# Patient Record
Sex: Male | Born: 1950 | Race: White | Hispanic: No | Marital: Married | Smoking: Former smoker
Health system: Southern US, Community
[De-identification: ages and names within clinical notes are randomized; demographics above are authoritative.]

## PROBLEM LIST (undated history)

## (undated) DIAGNOSIS — I77810 Thoracic aortic ectasia: Secondary | ICD-10-CM

## (undated) DIAGNOSIS — I2510 Atherosclerotic heart disease of native coronary artery without angina pectoris: Secondary | ICD-10-CM

## (undated) DIAGNOSIS — I700 Atherosclerosis of aorta: Secondary | ICD-10-CM

## (undated) DIAGNOSIS — I10 Essential (primary) hypertension: Secondary | ICD-10-CM

## (undated) DIAGNOSIS — E785 Hyperlipidemia, unspecified: Secondary | ICD-10-CM

## (undated) HISTORY — DX: Essential (primary) hypertension: I10

## (undated) HISTORY — DX: Thoracic aortic ectasia: I77.810

## (undated) HISTORY — DX: Hyperlipidemia, unspecified: E78.5

## (undated) HISTORY — DX: Atherosclerosis of aorta: I70.0

## (undated) HISTORY — PX: COLONOSCOPY: SHX174

## (undated) HISTORY — DX: Atherosclerotic heart disease of native coronary artery without angina pectoris: I25.10

---

## 1995-07-23 DIAGNOSIS — E785 Hyperlipidemia, unspecified: Secondary | ICD-10-CM

## 1995-07-23 HISTORY — DX: Hyperlipidemia, unspecified: E78.5

## 2003-11-22 ENCOUNTER — Encounter: Payer: Self-pay | Admitting: Family Medicine

## 2004-11-04 ENCOUNTER — Ambulatory Visit: Payer: Self-pay | Admitting: Family Medicine

## 2004-11-21 ENCOUNTER — Encounter: Payer: Self-pay | Admitting: Family Medicine

## 2004-12-12 ENCOUNTER — Ambulatory Visit: Payer: Self-pay | Admitting: Family Medicine

## 2005-01-28 ENCOUNTER — Ambulatory Visit: Payer: Self-pay | Admitting: Family Medicine

## 2005-04-09 ENCOUNTER — Ambulatory Visit: Payer: Self-pay | Admitting: Family Medicine

## 2005-05-12 ENCOUNTER — Ambulatory Visit: Payer: Self-pay | Admitting: Family Medicine

## 2005-05-22 ENCOUNTER — Ambulatory Visit: Payer: Self-pay | Admitting: Family Medicine

## 2005-05-26 ENCOUNTER — Ambulatory Visit: Payer: Self-pay | Admitting: Family Medicine

## 2005-06-04 ENCOUNTER — Ambulatory Visit: Payer: Self-pay | Admitting: Family Medicine

## 2005-06-04 DIAGNOSIS — E291 Testicular hypofunction: Secondary | ICD-10-CM

## 2005-06-23 ENCOUNTER — Ambulatory Visit: Payer: Self-pay | Admitting: Family Medicine

## 2005-07-25 ENCOUNTER — Ambulatory Visit: Payer: Self-pay | Admitting: Family Medicine

## 2005-08-19 ENCOUNTER — Ambulatory Visit: Payer: Self-pay | Admitting: Family Medicine

## 2005-08-27 ENCOUNTER — Ambulatory Visit: Payer: Self-pay | Admitting: Family Medicine

## 2005-09-19 ENCOUNTER — Ambulatory Visit: Payer: Self-pay | Admitting: Family Medicine

## 2005-10-03 ENCOUNTER — Ambulatory Visit: Payer: Self-pay | Admitting: Family Medicine

## 2005-10-08 ENCOUNTER — Ambulatory Visit: Payer: Self-pay | Admitting: Family Medicine

## 2005-10-30 ENCOUNTER — Ambulatory Visit: Payer: Self-pay | Admitting: Family Medicine

## 2005-11-12 ENCOUNTER — Ambulatory Visit: Payer: Self-pay | Admitting: Family Medicine

## 2005-11-25 ENCOUNTER — Ambulatory Visit: Payer: Self-pay | Admitting: Family Medicine

## 2005-11-28 ENCOUNTER — Ambulatory Visit: Payer: Self-pay | Admitting: Family Medicine

## 2005-12-24 ENCOUNTER — Ambulatory Visit: Payer: Self-pay | Admitting: Family Medicine

## 2006-01-09 ENCOUNTER — Ambulatory Visit: Payer: Self-pay | Admitting: Family Medicine

## 2006-01-22 ENCOUNTER — Encounter: Payer: Self-pay | Admitting: Family Medicine

## 2006-01-22 LAB — CONVERTED CEMR LAB: PSA: 1.03 ng/mL

## 2006-01-27 ENCOUNTER — Ambulatory Visit: Payer: Self-pay | Admitting: Family Medicine

## 2006-02-11 ENCOUNTER — Ambulatory Visit: Payer: Self-pay | Admitting: Family Medicine

## 2006-02-11 LAB — CONVERTED CEMR LAB: PSA: 1.03 ng/mL

## 2006-02-13 ENCOUNTER — Ambulatory Visit: Payer: Self-pay | Admitting: Family Medicine

## 2006-03-03 ENCOUNTER — Ambulatory Visit: Payer: Self-pay | Admitting: Family Medicine

## 2006-03-04 ENCOUNTER — Ambulatory Visit: Payer: Self-pay | Admitting: Family Medicine

## 2006-04-03 ENCOUNTER — Ambulatory Visit: Payer: Self-pay | Admitting: Family Medicine

## 2006-05-11 ENCOUNTER — Ambulatory Visit: Payer: Self-pay | Admitting: Family Medicine

## 2006-05-25 ENCOUNTER — Ambulatory Visit: Payer: Self-pay | Admitting: Family Medicine

## 2006-06-12 ENCOUNTER — Ambulatory Visit: Payer: Self-pay | Admitting: Family Medicine

## 2006-06-26 ENCOUNTER — Ambulatory Visit: Payer: Self-pay | Admitting: Family Medicine

## 2006-07-14 ENCOUNTER — Ambulatory Visit: Payer: Self-pay | Admitting: Family Medicine

## 2006-07-27 ENCOUNTER — Ambulatory Visit: Payer: Self-pay | Admitting: Family Medicine

## 2006-08-12 ENCOUNTER — Ambulatory Visit: Payer: Self-pay | Admitting: Family Medicine

## 2006-09-18 ENCOUNTER — Ambulatory Visit: Payer: Self-pay | Admitting: Family Medicine

## 2006-10-20 ENCOUNTER — Ambulatory Visit: Payer: Self-pay | Admitting: Family Medicine

## 2006-10-23 ENCOUNTER — Ambulatory Visit: Payer: Self-pay | Admitting: Family Medicine

## 2006-11-25 ENCOUNTER — Ambulatory Visit: Payer: Self-pay | Admitting: Family Medicine

## 2007-03-11 ENCOUNTER — Encounter: Payer: Self-pay | Admitting: Family Medicine

## 2007-03-11 DIAGNOSIS — E785 Hyperlipidemia, unspecified: Secondary | ICD-10-CM

## 2007-03-18 ENCOUNTER — Ambulatory Visit: Payer: Self-pay | Admitting: Family Medicine

## 2007-03-18 LAB — CONVERTED CEMR LAB
Albumin: 4 g/dL (ref 3.5–5.2)
Alkaline Phosphatase: 49 units/L (ref 39–117)
BUN: 12 mg/dL (ref 6–23)
Chloride: 107 meq/L (ref 96–112)
Creatinine, Ser: 1 mg/dL (ref 0.4–1.5)
Direct LDL: 163.2 mg/dL
HDL: 40.6 mg/dL (ref >39.0)
Potassium: 4.1 meq/L (ref 3.5–5.1)
Total Bilirubin: 0.9 mg/dL (ref 0.3–1.2)
VLDL: 31 mg/dL (ref 0–40)

## 2007-03-23 ENCOUNTER — Ambulatory Visit: Payer: Self-pay | Admitting: Family Medicine

## 2007-04-16 ENCOUNTER — Ambulatory Visit: Payer: Self-pay | Admitting: Family Medicine

## 2007-04-29 ENCOUNTER — Telehealth: Payer: Self-pay | Admitting: Family Medicine

## 2007-05-05 ENCOUNTER — Ambulatory Visit: Payer: Self-pay | Admitting: Family Medicine

## 2007-05-05 LAB — CONVERTED CEMR LAB: AST: 19 units/L (ref 0–37)

## 2007-06-17 ENCOUNTER — Encounter (INDEPENDENT_AMBULATORY_CARE_PROVIDER_SITE_OTHER): Payer: Self-pay | Admitting: *Deleted

## 2007-06-21 ENCOUNTER — Ambulatory Visit: Payer: Self-pay | Admitting: Family Medicine

## 2007-06-21 LAB — CONVERTED CEMR LAB
AST: 18 units/L (ref 0–37)
Cholesterol: 182 mg/dL (ref 0–200)
Testosterone: 313.85 ng/dL — ABNORMAL LOW (ref 350.00–890)
Total CHOL/HDL Ratio: 4.4

## 2007-06-23 ENCOUNTER — Ambulatory Visit: Payer: Self-pay | Admitting: Family Medicine

## 2007-12-01 ENCOUNTER — Telehealth: Payer: Self-pay | Admitting: Family Medicine

## 2008-01-14 ENCOUNTER — Encounter: Payer: Self-pay | Admitting: Family Medicine

## 2008-03-29 ENCOUNTER — Ambulatory Visit: Payer: Self-pay | Admitting: Family Medicine

## 2008-03-29 LAB — CONVERTED CEMR LAB
ALT: 22 units/L (ref 0–53)
BUN: 12 mg/dL (ref 6–23)
CO2: 30 meq/L (ref 19–32)
Calcium: 9.1 mg/dL (ref 8.4–10.5)
Cholesterol: 143 mg/dL (ref 0–200)
Creatinine, Ser: 1 mg/dL (ref 0.4–1.5)
Glucose, Bld: 99 mg/dL (ref 70–99)
HDL: 41.1 mg/dL (ref >39.0)
PSA: 1.06 ng/mL (ref 0.10–4.00)
TSH: 1.42 microintl units/mL (ref 0.35–5.50)
Total CHOL/HDL Ratio: 3.5
Total Protein: 7 g/dL (ref 6.0–8.3)
Triglycerides: 87 mg/dL (ref 0–149)

## 2008-04-04 ENCOUNTER — Ambulatory Visit: Payer: Self-pay | Admitting: Family Medicine

## 2008-05-12 ENCOUNTER — Ambulatory Visit: Payer: Self-pay | Admitting: Family Medicine

## 2008-05-17 ENCOUNTER — Encounter (INDEPENDENT_AMBULATORY_CARE_PROVIDER_SITE_OTHER): Payer: Self-pay | Admitting: *Deleted

## 2008-05-17 ENCOUNTER — Ambulatory Visit: Payer: Self-pay | Admitting: Family Medicine

## 2008-05-30 ENCOUNTER — Telehealth: Payer: Self-pay | Admitting: Family Medicine

## 2008-05-31 ENCOUNTER — Ambulatory Visit: Payer: Self-pay | Admitting: Family Medicine

## 2008-06-01 LAB — CONVERTED CEMR LAB: Testosterone: 1146.1 ng/dL — ABNORMAL HIGH (ref 350.00–890)

## 2009-04-23 ENCOUNTER — Telehealth: Payer: Self-pay | Admitting: Family Medicine

## 2009-06-06 ENCOUNTER — Ambulatory Visit: Payer: Self-pay | Admitting: Family Medicine

## 2009-06-07 LAB — CONVERTED CEMR LAB
ALT: 18 U/L (ref 0–53)
AST: 19 U/L (ref 0–37)
Albumin: 4.1 g/dL (ref 3.5–5.2)
Alkaline Phosphatase: 41 U/L (ref 39–117)
BUN: 15 mg/dL (ref 6–23)
Bilirubin, Direct: 0.1 mg/dL (ref 0.0–0.3)
CO2: 32 meq/L (ref 19–32)
Calcium: 8.8 mg/dL (ref 8.4–10.5)
Chloride: 101 meq/L (ref 96–112)
Cholesterol: 139 mg/dL (ref 0–200)
Creatinine, Ser: 1 mg/dL (ref 0.4–1.5)
GFR calc non Af Amer: 82.1 mL/min (ref >60)
Glucose, Bld: 98 mg/dL (ref 70–99)
HDL: 43.6 mg/dL (ref >39.00)
LDL Cholesterol: 75 mg/dL (ref 0–99)
PSA: 1.37 ng/mL (ref 0.10–4.00)
Potassium: 4.1 meq/L (ref 3.5–5.1)
Sodium: 143 meq/L (ref 135–145)
TSH: 0.92 u[IU]/mL (ref 0.35–5.50)
Total Bilirubin: 1 mg/dL (ref 0.3–1.2)
Total CHOL/HDL Ratio: 3
Total Protein: 7.1 g/dL (ref 6.0–8.3)
Triglycerides: 101 mg/dL (ref 0.0–149.0)
VLDL: 20.2 mg/dL (ref 0.0–40.0)

## 2009-06-13 ENCOUNTER — Ambulatory Visit: Payer: Self-pay | Admitting: Family Medicine

## 2009-06-13 LAB — CONVERTED CEMR LAB: Testosterone: 160.21 ng/dL — ABNORMAL LOW (ref 350.00–890.00)

## 2009-06-20 ENCOUNTER — Telehealth: Payer: Self-pay | Admitting: Family Medicine

## 2009-07-03 ENCOUNTER — Ambulatory Visit: Payer: Self-pay | Admitting: Gastroenterology

## 2009-07-17 ENCOUNTER — Ambulatory Visit: Payer: Self-pay | Admitting: Gastroenterology

## 2010-03-27 ENCOUNTER — Telehealth: Payer: Self-pay | Admitting: Family Medicine

## 2010-05-29 ENCOUNTER — Encounter: Payer: Self-pay | Admitting: Family Medicine

## 2010-05-31 ENCOUNTER — Ambulatory Visit: Payer: Self-pay | Admitting: Family Medicine

## 2010-06-02 LAB — CONVERTED CEMR LAB
ALT: 23 units/L (ref 0–53)
AST: 19 units/L (ref 0–37)
Albumin: 4.3 g/dL (ref 3.5–5.2)
Alkaline Phosphatase: 45 units/L (ref 39–117)
BUN: 18 mg/dL (ref 6–23)
CO2: 30 meq/L (ref 19–32)
Chloride: 106 meq/L (ref 96–112)
Cholesterol: 176 mg/dL (ref 0–200)
GFR calc non Af Amer: 71.78 mL/min (ref >60)
Glucose, Bld: 94 mg/dL (ref 70–99)
Potassium: 4.2 meq/L (ref 3.5–5.1)
Sodium: 142 meq/L (ref 135–145)
TSH: 0.99 microintl units/mL (ref 0.35–5.50)
Total Protein: 6.7 g/dL (ref 6.0–8.3)
VLDL: 18.2 mg/dL (ref 0.0–40.0)

## 2010-06-17 ENCOUNTER — Ambulatory Visit: Payer: Self-pay | Admitting: Family Medicine

## 2010-07-15 ENCOUNTER — Encounter: Payer: Self-pay | Admitting: Family Medicine

## 2010-07-24 ENCOUNTER — Encounter (INDEPENDENT_AMBULATORY_CARE_PROVIDER_SITE_OTHER): Payer: Self-pay | Admitting: *Deleted

## 2010-10-14 ENCOUNTER — Telehealth: Payer: Self-pay | Admitting: Family Medicine

## 2010-10-22 ENCOUNTER — Encounter: Payer: Self-pay | Admitting: Family Medicine

## 2010-11-20 ENCOUNTER — Emergency Department (HOSPITAL_COMMUNITY)
Admission: EM | Admit: 2010-11-20 | Discharge: 2010-11-20 | Payer: Self-pay | Source: Home / Self Care | Admitting: Family Medicine

## 2010-11-29 ENCOUNTER — Emergency Department (HOSPITAL_COMMUNITY)
Admission: EM | Admit: 2010-11-29 | Discharge: 2010-11-29 | Payer: Self-pay | Source: Home / Self Care | Admitting: Family Medicine

## 2010-12-04 ENCOUNTER — Encounter: Payer: Self-pay | Admitting: Family Medicine

## 2010-12-12 ENCOUNTER — Telehealth: Payer: Self-pay | Admitting: Family Medicine

## 2011-01-21 NOTE — Consult Note (Signed)
Summary: Dr.Marc Nesi,[facility],Note  Dr.Marc Nesi,[facility],Note   Imported By: Beau Fanny 08/02/2010 08:47:44  _____________________________________________________________________  External Attachment:    Type:   Image     Comment:   External Document  Appended Document: Dr.Marc Nesi,[facility],Note    Clinical Lists Changes  Observations: Added new observation of PAST MED HX: Hyperlipidemia 8/96 low T per Dr. Brunilda Payor with uro (08/03/2010 19:29) Added new observation of DM PROGRESS: N/A (08/03/2010 19:29) Added new observation of DM FSREVIEW: N/A (08/03/2010 19:29) Added new observation of HTN PROGRESS: N/A (08/03/2010 19:29) Added new observation of HTN FSREVIEW: N/A (08/03/2010 19:29)         Past History:  Past Medical History: Hyperlipidemia 8/96 low T per Dr. Brunilda Payor with uro

## 2011-01-21 NOTE — Consult Note (Signed)
Summary: [facility]  [facility]   Imported By: Lanelle Bal 07/22/2010 10:19:30  _____________________________________________________________________  External Attachment:    Type:   Image     Comment:   External Document  Appended Document: [facility] Lyla Son has asked that this report be re-faxed.

## 2011-01-21 NOTE — Medication Information (Signed)
Summary: [facility] Prior Review/Certification Change  [facility] [employer] [facility] Prior Review/Certification Change   Imported By: Beau Fanny 06/18/2010 15:33:57  _____________________________________________________________________  External Attachment:    Type:   Image     Comment:   External Document

## 2011-01-21 NOTE — Progress Notes (Signed)
Summary: refill request for androgel  Phone Note Refill Request Call back at Home Phone [phone] Message from:  Patient  Refills Requested: Medication #1:  ANDROGEL PUMP 1 %  GEL Apply 5 gm once daily with directions and precautions Please call to [facility] [address].  [phone]  Initial call taken by: Lowella Petties CMA,  March 27, 2010 3:55 PM  Follow-up for Phone Call        Pls insure he keeps his appts the end of June. Follow-up by: Shaune Leeks MD,  March 27, 2010 5:05 PM  Additional Follow-up for Phone Call Additional follow up Details #1::        Called to [facility] [address]. Additional Follow-up by: Lowella Petties CMA,  March 27, 2010 5:09 PM    Prescriptions: ANDROGEL PUMP 1 %  GEL (TESTOSTERONE) Apply 5 gm once daily with directions and precautions  #150 Gram x 0   Entered and Authorized by:   Shaune Leeks MD   Signed by:   Shaune Leeks MD on 03/27/2010   Method used:   Print then Give to Patient   RxID:   [id number]

## 2011-01-21 NOTE — Consult Note (Signed)
Summary: Dr.Michael Daniel,[facility],Note  Dr.Michael Daniel,[facility],Note   Imported By: Beau Fanny 10/24/2010 09:21:30  _____________________________________________________________________  External Attachment:    Type:   Image     Comment:   External Document

## 2011-01-21 NOTE — Letter (Signed)
Summary: Nadara Eaton letter  [place] [facility]  [address] [place], [place] [address]   Phone: [phone]  Fax: [phone]       07/24/2010 MRN: [id number]  Domanic Geidel [address] [place], [place]  [address]  Dear Mr. W[facility] [place], and [facility] announce the retirement of Arta Silence, M.D., from full-time practice at the [facility] office effective June 20, 2010 and his plans of returning part-time.  It is important to Dr. Hetty Ely and to our practice that you understand that [facility] has seven physicians in our office for your health care needs.  We will continue to offer the same exceptional care that you have today.    Dr. Hetty Ely has spoken to many of you about his plans for retirement and returning part-time in the fall.   We will continue to work with you through the transition to schedule appointments for you in the office and meet the high standards that [place] is committed to.   Again, it is with great pleasure that we share the news that Dr. Hetty Ely will return to [facility] in October of 2011 with a reduced schedule.    If you have any questions, or would like to request an appointment with one of our physicians, please call us at [phone] and press the option for Scheduling an appointment.  We take pleasure in providing you with excellent patient care and look forward to seeing you at your next office visit.  Our [facility] Physicians are:  Tillman Abide, M.D. Laurita Quint, M.D. Roxy Manns, M.D. Kerby Nora, M.D. Hannah Beat, M.D. Ruthe Mannan, M.D. We proudly welcomed Raechel Ache, M.D. and Eustaquio Boyden, M.D. to the practice in July/August 2011.  Sincerely,  [place] [facility]

## 2011-01-21 NOTE — Progress Notes (Signed)
Summary: wants to see a different urologist  Phone Note Call from Patient Call back at Home Phone [phone] Call back at [phone]   Caller: Patient Call For: Randy Leeks MD Summary of Call: Pt is not happy with urologist and wants to be referred to someone in [place] or [place].  He is dissatified with the wait time at Dr. Madilyn Hook office.   Initial call taken by: Lowella Petties CMA, AAMA,  October 14, 2010 10:11 AM  Follow-up for Phone Call        Please refer to Dr Reuel Boom in [place]. Follow-up by: Randy Leeks MD,  October 15, 2010 12:38 PM

## 2011-01-21 NOTE — Assessment & Plan Note (Signed)
Summary: CPX/DLO   Vital Signs:  Patient profile:   60 year old male Height:      73 inches Weight:      234.50 pounds BMI:     31.05 Temp:     98.7 degrees F oral Pulse rate:   84 / minute Pulse rhythm:   regular BP sitting:   130 / 88  (left arm) Cuff size:   large  Vitals Entered By: Sydell Axon LPN (June 17, 2010 1:56 PM) CC: 30 Minute checkup, had a colonoscopy 07/10 by Dr. Arlyce Dice   History of Present Illness: Pt here for Comp Exam. He has somre left knee disconfort he attributes this to strain and it goes away in 2-3 days. He is on his feet for prolonged time working 12 hrs at a time. He tries to do 10000steps daily at work.  He needs alternative testosterone as his insurance has decided not to cover his medication.  Preventive Screening-Counseling & Management  Alcohol-Tobacco     Alcohol drinks/day:  5-6 times a week     Alcohol type: wine     Smoking Status: quit     Year Quit: 79     Pack years: 10     Passive Smoke Exposure: no  Caffeine-Diet-Exercise     Caffeine use/day: 2     Does Patient Exercise: yes     Type of exercise: walking 20-30mins     Times/week: 4  Problems Prior to Update: 1)  Special Screening Malig Neoplasms Other Sites  (ICD-V76.49) 2)  Special Screening Malignant Neoplasm of Prostate  (ICD-V76.44) 3)  Health Maintenance Exam  (ICD-V70.0) 4)  Testosterone Deficiency  (ICD-257.2) 5)  Hyperlipidemia  (ICD-272.4)  Medications Prior to Update: 1)  Fish Oil 1000 Mg  Caps (Omega-3 Fatty Acids) .... Take One By Mouth Two Times A Day 2)  Androgel Pump 1 %  Gel (Testosterone) .... Apply 5 Gm Once Daily With Directions and Precautions 3)  Vitamin C .... Take One By Mouth Daily 4)  Vitamin D .... Take One By Mouth Daily 5)  Simvastatin 40 Mg  Tabs (Simvastatin) .Marland Kitchen.. 1 Q Hs  Allergies: No Known Drug Allergies  Past History:  Past Medical History: Last updated: 03/11/2007 Hyperlipidemia 8/96  Family History: Last updated:  06/17/2010 Father A 82 Going Blind  Catarracts  Mother A 7^  Htn DM Kidney removal Twin Brother A 57  CABGx3  (Smoker longtime, now quit) Sister A 66  Social History: Last updated: 04/04/2008 Occupation: [employer] tech [employer] Married lives w/wife daughter 39yoa  Risk Factors: Alcohol Use:  5-6 times a week (06/17/2010) Caffeine Use: 2 (06/17/2010) Exercise: yes (06/17/2010)  Risk Factors: Smoking Status: quit (06/17/2010) Passive Smoke Exposure: no (06/17/2010)  Family History: Father A 82 Going Blind  Education officer, community  Mother A 7^  Htn DM Kidney removal Twin Brother A 57  CABGx3  (Smoker longtime, now quit) Sister A 46  Review of Systems General:  Denies chills, fatigue, fever, sweats, weakness, and weight loss. Eyes:  Denies blurring, discharge, eye irritation, and eye pain. ENT:  Denies decreased hearing, ear discharge, and earache. CV:  Denies chest pain or discomfort, fainting, fatigue, palpitations, shortness of breath with exertion, swelling of feet, and swelling of hands. Resp:  Denies cough, shortness of breath, and wheezing. GI:  Denies abdominal pain, bloody stools, change in bowel habits, constipation, dark tarry stools, diarrhea, indigestion, loss of appetite, nausea, vomiting, vomiting blood, and yellowish skin color. GU:  Denies discharge, dysuria, incontinence, nocturia,  and urinary frequency. MS:  Denies joint pain, muscle aches, cramps, muscle weakness, and stiffness; occas knee discomfort, achillees irritation. Derm:  Denies dryness, itching, and rash. Neuro:  Denies numbness, poor balance, tingling, and tremors.  Physical Exam  General:  Well-developed,well-nourished,in no acute distress; alert,appropriate and cooperative throughout examination Head:  Normocephalic and atraumatic without obvious abnormalities. No apparent alopecia or balding. Sinuses NT. Eyes:  Conjunctiva clear bilaterally.  Ears:  External ear exam shows no significant lesions or deformities.   Otoscopic examination reveals clear canals, tympanic membranes are intact bilaterally without bulging, retraction, inflammation or discharge. Hearing is grossly normal bilaterally. Nose:  External nasal examination shows no deformity or inflammation. Nasal mucosa are pink and moist without lesions or exudates. Mouth:  Oral mucosa and oropharynx without lesions or exudates.  Teeth in good repair. Neck:  No deformities, masses, or tenderness noted. Chest Wall:  No deformities, masses, tenderness or gynecomastia noted. Breasts:  No masses or gynecomastia noted Lungs:  Normal respiratory effort, chest expands symmetrically. Lungs are clear to auscultation, no crackles or wheezes. Heart:  Normal rate and regular rhythm. S1 and S2 normal without gallop, murmur, click, rub or other extra sounds. Abdomen:  Bowel sounds positive,abdomen soft and non-tender without masses, organomegaly or hernias noted. Rectal:  No external abnormalities noted. Normal sphincter tone. No rectal masses or tenderness. G neg. Genitalia:  Testes bilaterally descended without nodularity, tenderness or masses. No scrotal masses or lesions. No penis lesions or urethral discharge. Prostate:  Prostate gland firm and smooth, no enlargement, nodularity, tenderness, mass, asymmetry or induration. 20-30gms. Msk:  No deformity or scoliosis noted of thoracic or lumbar spine.   Pulses:  R and L carotid,radial,femoral,dorsalis pedis and posterior tibial pulses are full and equal bilaterally Extremities:  No clubbing, cyanosis, edema, or deformity noted with normal full range of motion of all joints.   Neurologic:  No cranial nerve deficits noted. Station and gait are normal.  Sensory, motor and coordinative functions appear intact. Skin:  Intact without suspicious lesions or rashes Cervical Nodes:  No lymphadenopathy noted Inguinal Nodes:  No significant adenopathy Psych:  Cognition and judgment appear intact. Alert and cooperative with  normal attention span and concentration. No apparent delusions, illusions, hallucinations   Impression & Recommendations:  Problem # 1:  HEALTH MAINTENANCE EXAM (ICD-V70.0)  Reviewed preventive care protocols, scheduled due services, and updated immunizations.  Problem # 2:  SPECIAL SCREENING MALIGNANT NEOPLASM OF PROSTATE (ICD-V76.44) Assessment: Unchanged Stable exam and PSA.  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 Will be given higher dose. Labs o/w nml. Orders: Urology Referral (Urology)  Problem # 4:  HYPERLIPIDEMIA (ICD-272.4) Assessment: Unchanged Stable and acceptable levels. Cont Simva. His updated medication list for this problem includes:    Simvastatin 40 Mg Tabs (Simvastatin) .Marland Kitchen... Take one by mouth at bedtime  Labs Reviewed: SGOT: 19 (05/31/2010)   SGPT: 23 (05/31/2010)   HDL:45.60 (05/31/2010), 43.60 (06/06/2009)  LDL:112 (05/31/2010), 75 (16/09/9603)  Chol:176 (05/31/2010), 139 (06/06/2009)  Trig:91.0 (05/31/2010), 101.0 (06/06/2009)  Complete Medication List: 1)  Fish Oil 1000 Mg Caps (Omega-3 fatty acids) .... Take one by mouth two times a day 2)  Androgel Pump 1 % Gel (Testosterone) .... Apply 5 gm once daily with directions and precautions 3)  Simvastatin 40 Mg Tabs (Simvastatin) .... Take one by mouth at bedtime 4)  Multivitamins Tabs (Multiple vitamin) .... Take one by mouth daily 5)  Aspirin 81 Mg Tabs (Aspirin) .... Take one by  mouth daily  Patient Instructions: 1)  Refer to Urology. Prescriptions: ANDROGEL PUMP 1 %  GEL (TESTOSTERONE) Apply 5 gm once daily with directions and precautions  #150 Gram x 0   Entered and Authorized by:   Shaune Leeks MD   Signed by:   Shaune Leeks MD on 06/17/2010   Method used:   Print then Give to Patient   RxID:   [id number]   Current Allergies (reviewed today): No known allergies

## 2011-01-23 NOTE — Letter (Signed)
Summary: [facility]  [facility]   Imported By: Maryln Gottron 12/25/2010 09:40:23  _____________________________________________________________________  External Attachment:    Type:   Image     Comment:   External Document

## 2011-01-23 NOTE — Progress Notes (Signed)
Summary: Rx Androgel  Phone Note Refill Request Call back at [phone] Message from:  [facility] on December 12, 2010 10:28 AM  Refills Requested: Medication #1:  ANDROGEL PUMP 1 %  GEL Apply 5 gm once daily with directions and precautions   Last Refilled: 06/17/2010  Method Requested: Electronic Initial call taken by: Sydell Axon LPN,  December 12, 2010 10:28 AM  Follow-up for Phone Call        Prefer the pt get his Androgel script from Dr Reuel Boom who he is now seeing for Testosterone replacement. Follow-up by: Shaune Leeks MD,  December 12, 2010 10:42 AM  Additional Follow-up for Phone Call Additional follow up Details #1::        Pharmacy notified as instructed by telephone. Additional Follow-up by: Sydell Axon LPN,  December 12, 2010 11:02 AM

## 2011-05-24 ENCOUNTER — Encounter: Payer: Self-pay | Admitting: Family Medicine

## 2011-05-29 ENCOUNTER — Other Ambulatory Visit: Payer: Self-pay | Admitting: Family Medicine

## 2011-05-29 DIAGNOSIS — E785 Hyperlipidemia, unspecified: Secondary | ICD-10-CM

## 2011-05-29 DIAGNOSIS — Z0000 Encounter for general adult medical examination without abnormal findings: Secondary | ICD-10-CM

## 2011-06-20 ENCOUNTER — Other Ambulatory Visit (INDEPENDENT_AMBULATORY_CARE_PROVIDER_SITE_OTHER): Payer: BC Managed Care – PPO

## 2011-06-20 DIAGNOSIS — E785 Hyperlipidemia, unspecified: Secondary | ICD-10-CM

## 2011-06-20 DIAGNOSIS — Z0000 Encounter for general adult medical examination without abnormal findings: Secondary | ICD-10-CM

## 2011-06-20 LAB — HEPATIC FUNCTION PANEL
ALT: 20 U/L (ref 0–53)
Bilirubin, Direct: 0.1 mg/dL (ref 0.0–0.3)
Total Bilirubin: 0.7 mg/dL (ref 0.3–1.2)

## 2011-06-20 LAB — BASIC METABOLIC PANEL
CO2: 29 mEq/L (ref 19–32)
Calcium: 9.2 mg/dL (ref 8.4–10.5)
Chloride: 106 mEq/L (ref 96–112)
Glucose, Bld: 94 mg/dL (ref 70–99)
Potassium: 4 mEq/L (ref 3.5–5.1)
Sodium: 142 mEq/L (ref 135–145)

## 2011-06-20 LAB — LIPID PANEL
Total CHOL/HDL Ratio: 3
VLDL: 12 mg/dL (ref 0.0–40.0)

## 2011-06-26 ENCOUNTER — Ambulatory Visit (INDEPENDENT_AMBULATORY_CARE_PROVIDER_SITE_OTHER): Payer: BC Managed Care – PPO | Admitting: Family Medicine

## 2011-06-26 ENCOUNTER — Encounter: Payer: Self-pay | Admitting: Family Medicine

## 2011-06-26 DIAGNOSIS — E291 Testicular hypofunction: Secondary | ICD-10-CM

## 2011-06-26 DIAGNOSIS — E785 Hyperlipidemia, unspecified: Secondary | ICD-10-CM

## 2011-06-26 MED ORDER — SIMVASTATIN 40 MG PO TABS: 40.0000 mg | ORAL_TABLET | Freq: Every day | ORAL | Status: DC

## 2011-06-26 NOTE — Assessment & Plan Note (Signed)
Cont trmt via Dr Reuel Boom.

## 2011-06-26 NOTE — Assessment & Plan Note (Signed)
Good nos. Cont cuyrr medication. Lab Results  Component Value Date   CHOL 157 06/20/2011   CHOL 176 05/31/2010   CHOL 139 06/06/2009   Lab Results  Component Value Date   HDL 48.90 06/20/2011   HDL 81.19 05/31/2010   HDL 14.78 06/06/2009   Lab Results  Component Value Date   LDLCALC 96 06/20/2011   LDLCALC 112* 05/31/2010   LDLCALC 75 06/06/2009   Lab Results  Component Value Date   TRIG 60.0 06/20/2011   TRIG 91.0 05/31/2010   TRIG 101.0 06/06/2009   Lab Results  Component Value Date   CHOLHDL 3 06/20/2011   CHOLHDL 4 05/31/2010   CHOLHDL 3 06/06/2009   Lab Results  Component Value Date   LDLDIRECT 163.2 03/18/2007

## 2011-06-26 NOTE — Progress Notes (Signed)
  Subjective:    Patient ID: Randy Pittman, male    DOB: [birth year], 60 y.o.   MRN: [id number]  HPI Pty here fro Comp Exam. He feels well and has no complaints.     Review of Systems  Constitutional: Negative for fever, chills, diaphoresis, appetite change, fatigue and unexpected weight change.  HENT: Negative for hearing loss, ear pain, tinnitus and ear discharge.   Eyes: Negative for pain, discharge and visual disturbance.  Respiratory: Negative for cough, shortness of breath and wheezing.   Cardiovascular: Negative for chest pain and palpitations.       No SOB w/ exertion  Gastrointestinal: Negative for nausea, vomiting, abdominal pain, diarrhea, constipation and blood in stool.       No heartburn or swallowing problems.  Genitourinary: Negative for dysuria, frequency and difficulty urinating.       No nocturia  Musculoskeletal: Negative for myalgias and back pain. Arthralgias: mild, chronically   Skin: Negative for rash.       No itching or dryness.  Neurological: Negative for tremors and numbness.       No tingling or balance problems.  Hematological: Negative for adenopathy. Does not bruise/bleed easily.  Psychiatric/Behavioral: Negative for dysphoric mood and agitation.       Objective:   Physical Exam  Constitutional: He is oriented to person, place, and time. He appears well-developed and well-nourished. No distress.  HENT:  Head: Normocephalic and atraumatic.  Right Ear: External ear normal.  Left Ear: External ear normal.  Nose: Nose normal.  Mouth/Throat: Oropharynx is clear and moist.  Eyes: Conjunctivae and EOM are normal. Pupils are equal, round, and reactive to light. Right eye exhibits no discharge. Left eye exhibits no discharge. No scleral icterus.  Neck: Normal range of motion. Neck supple. No thyromegaly present.  Cardiovascular: Normal rate, regular rhythm, normal heart sounds and intact distal pulses.   No murmur heard. Pulmonary/Chest: Effort normal  and breath sounds normal. No respiratory distress. He has no wheezes.  Abdominal: Soft. Bowel sounds are normal. He exhibits no distension and no mass. There is no tenderness. There is no rebound and no guarding.  Genitourinary: Rectum normal, prostate normal and penis normal. Guaiac negative stool.       Prostate 30gms smooth and symmetric.  Musculoskeletal: Normal range of motion. He exhibits no edema.  Lymphadenopathy:    He has no cervical adenopathy.  Neurological: He is alert and oriented to person, place, and time. Coordination normal.  Skin: Skin is warm and dry. No rash noted. He is not diaphoretic.  Psychiatric: He has a normal mood and affect. His behavior is normal. Judgment and thought content normal.          Assessment & Plan:  HMPE

## 2012-04-06 ENCOUNTER — Encounter: Payer: Self-pay | Admitting: Family Medicine

## 2012-04-06 ENCOUNTER — Ambulatory Visit (INDEPENDENT_AMBULATORY_CARE_PROVIDER_SITE_OTHER): Payer: BC Managed Care – PPO | Admitting: Family Medicine

## 2012-04-06 VITALS — BP 122/90 | HR 92 | Temp 98.3°F | Wt 239.0 lb

## 2012-04-06 DIAGNOSIS — R413 Other amnesia: Secondary | ICD-10-CM

## 2012-04-06 NOTE — Patient Instructions (Signed)
Stop the simvastatin for now. Call back with an update in about 2 weeks, sooner if needed.  Ask for Lugene or Para March.  Take care.  Glad to see you.

## 2012-04-06 NOTE — Progress Notes (Signed)
Memory trouble.  Worse over last few weeks per patient.  Wife had noted it over last few months.  He had trouble working the phone recently.  This was atypical.  He didn't know if it was related to statin.  He stopped it a few days ago.  Wife thinks he's some better in the meantime and slightly less confused but not back to baseline.  He's not having trouble at work but he has a routine at work.    Off testosterone for >1 year, prev rx'd per Uro.  No other new meds, changes.  He does work nights, has been doing that for 10+ years.  He sleeps well usually during the day.  No FCNAVD, no rash.  He feels well.   No focal neuro changes.  No h/o stroke.    PMH and SH reviewed  ROS: See HPI, otherwise noncontributory.  Meds, vitals, and allergies reviewed.   GEN: nad, alert and oriented HEENT: mucous membranes moist NECK: supple w/o LA CV: rrr.  no murmur PULM: ctab, no inc wob ABD: soft, +bs EXT: no edema SKIN: no acute rash CN 2-12 wnl B, S/S/DTR wnl x4 29/30 on MMSE

## 2012-04-08 DIAGNOSIS — R413 Other amnesia: Secondary | ICD-10-CM | POA: Insufficient documentation

## 2012-04-08 NOTE — Assessment & Plan Note (Addendum)
No overt sx today, wife thinks he is improving.  29/30 on MMSE.  This could be related to statin.  We talked about options.  Will hold off on w/u at this point with normal neuro exam today.  See if continues to improve off statin.  Call back with update.  Okay for outpatient f/u.  >25 min spent with face to face with patient, >50% counseling and/or coordinating care

## 2012-06-10 ENCOUNTER — Encounter: Payer: Self-pay | Admitting: Family Medicine

## 2012-06-10 ENCOUNTER — Ambulatory Visit (INDEPENDENT_AMBULATORY_CARE_PROVIDER_SITE_OTHER): Payer: BC Managed Care – PPO | Admitting: Family Medicine

## 2012-06-10 VITALS — BP 132/90 | HR 80 | Temp 98.4°F | Wt 233.0 lb

## 2012-06-10 DIAGNOSIS — R413 Other amnesia: Secondary | ICD-10-CM

## 2012-06-10 LAB — COMPREHENSIVE METABOLIC PANEL
ALT: 21 U/L (ref 0–53)
AST: 20 U/L (ref 0–37)
BUN: 20 mg/dL (ref 6–23)
Creatinine, Ser: 1.1 mg/dL (ref 0.4–1.5)
GFR: 75.95 mL/min (ref >60.00)
Total Bilirubin: 0.6 mg/dL (ref 0.3–1.2)

## 2012-06-10 LAB — CBC WITH DIFFERENTIAL/PLATELET
Basophils Absolute: 0.1 10*3/uL (ref 0.0–0.1)
Basophils Relative: 0.9 % (ref 0.0–3.0)
Eosinophils Absolute: 0.2 10*3/uL (ref 0.0–0.7)
HCT: 46 % (ref 39.0–52.0)
Hemoglobin: 15.1 g/dL (ref 13.0–17.0)
Lymphs Abs: 1.3 10*3/uL (ref 0.7–4.0)
MCHC: 32.9 g/dL (ref 30.0–36.0)
MCV: 89.6 fl (ref 78.0–100.0)
Monocytes Absolute: 0.6 10*3/uL (ref 0.1–1.0)
Neutro Abs: 3.6 10*3/uL (ref 1.4–7.7)
RDW: 14.2 % (ref 11.5–14.6)

## 2012-06-10 NOTE — Progress Notes (Signed)
Prev seen for possible memory changes. In interval, he stayed of simvastatin. Unclear if this had a beneficial effect.  Since last OV- he had one episode driving, almost missed at stop sign (he didn't miss it).  He was talking and it was unclear if this was due to attention or otherwise.  He had the prev episode noted when he couldn't work the phone.  Others have noted that he is less precise, careful.  He isn't sure that he has any changes going on.    Off testosterone for >1 year, prev rx'd per Uro. No other new meds, changes. He does work nights, has been doing that for 10+ years.   Last OV note reviewed.   Meds, vitals, and allergies reviewed.   ROS: See HPI.  Otherwise, noncontributory.  GEN: nad, alert and oriented HEENT: mucous membranes moist NECK: supple w/o LA CV: rrr PULM: ctab, no inc wob ABD: soft, +bs EXT: no edema SKIN: no acute rash CN 2-12 wnl B, S/S/DTR wnl x4 Prev 29/30 on MMSE.  Again 29/30 today.

## 2012-06-10 NOTE — Patient Instructions (Addendum)
Go to the lab on the way out.  We'll contact you with your lab report. If you notice other change in the meantime, notify me.

## 2012-06-11 NOTE — Assessment & Plan Note (Signed)
Unclear if this is really present.  If present, the source isn't clear. Would check baseline labs today. If wnl, would check CT head.  If still negative w/u at that point, would follow and consider neuro referral.  >25 min spent with face to face with patient, >50% counseling and/or coordinating care.  See notes on labs.

## 2012-06-18 ENCOUNTER — Ambulatory Visit (INDEPENDENT_AMBULATORY_CARE_PROVIDER_SITE_OTHER)
Admission: RE | Admit: 2012-06-18 | Discharge: 2012-06-18 | Disposition: A | Discharge status: Home / Self Care | Payer: BC Managed Care – PPO | Source: Ambulatory Visit | Attending: Family Medicine | Admitting: Family Medicine

## 2012-06-18 DIAGNOSIS — R413 Other amnesia: Secondary | ICD-10-CM

## 2012-07-02 ENCOUNTER — Other Ambulatory Visit: Payer: Self-pay | Admitting: Family Medicine

## 2012-07-02 DIAGNOSIS — Z125 Encounter for screening for malignant neoplasm of prostate: Secondary | ICD-10-CM

## 2012-07-02 DIAGNOSIS — E7800 Pure hypercholesterolemia, unspecified: Secondary | ICD-10-CM

## 2012-07-09 ENCOUNTER — Other Ambulatory Visit (INDEPENDENT_AMBULATORY_CARE_PROVIDER_SITE_OTHER): Payer: BC Managed Care – PPO

## 2012-07-09 DIAGNOSIS — E7800 Pure hypercholesterolemia, unspecified: Secondary | ICD-10-CM

## 2012-07-09 DIAGNOSIS — Z125 Encounter for screening for malignant neoplasm of prostate: Secondary | ICD-10-CM

## 2012-07-09 LAB — LIPID PANEL
HDL: 50.2 mg/dL (ref >39.00)
Triglycerides: 112 mg/dL (ref 0.0–149.0)
VLDL: 22.4 mg/dL (ref 0.0–40.0)

## 2012-07-09 LAB — PSA: PSA: 1.35 ng/mL (ref 0.10–4.00)

## 2012-07-09 LAB — LDL CHOLESTEROL, DIRECT: Direct LDL: 169.4 mg/dL

## 2012-07-13 ENCOUNTER — Encounter: Payer: Self-pay | Admitting: Family Medicine

## 2012-07-13 ENCOUNTER — Ambulatory Visit (INDEPENDENT_AMBULATORY_CARE_PROVIDER_SITE_OTHER): Payer: BC Managed Care – PPO | Admitting: Family Medicine

## 2012-07-13 VITALS — BP 140/90 | HR 84 | Temp 98.8°F | Ht 72.75 in | Wt 227.2 lb

## 2012-07-13 DIAGNOSIS — Z0000 Encounter for general adult medical examination without abnormal findings: Secondary | ICD-10-CM | POA: Insufficient documentation

## 2012-07-13 DIAGNOSIS — R413 Other amnesia: Secondary | ICD-10-CM

## 2012-07-13 NOTE — Patient Instructions (Addendum)
See Shirlee Limerick about your referral before you leave today. You can start back on the simvastatin if okay with neurology.  Take care.

## 2012-07-13 NOTE — Assessment & Plan Note (Signed)
30/30 on MMSE today.  I don't know if his history is significant.  We agreed with neuro referral.  If that is unremarkable, I would restart statin and f/u prn.  He agrees.

## 2012-07-13 NOTE — Progress Notes (Signed)
CPE- See plan.  Routine anticipatory guidance given to patient.  See health maintenance. Tetanus 2011 Flu shot done at work Colonoscopy 2010 PSA wnl. Prostate cancer screening and PSA options (with potential risks and benefits of testing vs not testing) were discussed along with recent recs/guidelines.  Living will.  He'll check his papers for this.    Prev with question of short term memory changes.  He had one episode where he had trouble working a phone. See prev notes.  No recent obvious changes.  CT head was negative. Prev lab w/u was negative.  He's noted some mild short term changes, ie with name recall.  Father with dementia.  It was unclear if coming off the simvastatin was helpful in terms of symptoms.  He has not yet seen neuro. He agrees with referral.    PMH and SH reviewed  Meds, vitals, and allergies reviewed.   ROS: See HPI.  Otherwise negative.    GEN: nad, alert and oriented HEENT: mucous membranes moist NECK: supple w/o LA CV: rrr. PULM: ctab, no inc wob ABD: soft, +bs EXT: no edema SKIN: no acute rash DRE deferred. CN 2-12 wnl B, S/S/DTR wnl x4

## 2012-07-22 ENCOUNTER — Encounter: Payer: Self-pay | Admitting: Family Medicine

## 2012-08-18 ENCOUNTER — Other Ambulatory Visit: Payer: Self-pay

## 2012-08-18 MED ORDER — SIMVASTATIN 40 MG PO TABS: 40.0000 mg | ORAL_TABLET | Freq: Every day | ORAL | Status: DC

## 2012-08-18 NOTE — Telephone Encounter (Signed)
Sent!

## 2012-08-18 NOTE — Telephone Encounter (Signed)
[facility] faxed refill request Simvastatin. pts wife said Dr Daphane Shepherd neurologist said OK to restart Simvastatin.See 07/13/12 office note.Please advise.

## 2012-08-19 ENCOUNTER — Other Ambulatory Visit: Payer: Self-pay | Admitting: Family Medicine

## 2012-08-19 MED ORDER — SIMVASTATIN 40 MG PO TABS: 40.0000 mg | ORAL_TABLET | Freq: Every day | ORAL | Status: DC

## 2013-05-19 ENCOUNTER — Telehealth: Payer: Self-pay | Admitting: Family Medicine

## 2013-05-19 NOTE — Telephone Encounter (Signed)
Will see PCP next week.

## 2013-05-19 NOTE — Telephone Encounter (Signed)
Noted  

## 2013-05-19 NOTE — Telephone Encounter (Signed)
Patient Information:  Caller Name: Lupita Leash  Phone: [phone]  Patient: Randy Pittman, Randy Pittman  Gender: Male  DOB: 06-17-1951  Age: 62 Years  PCP: Crawford Givens Clelia Croft) [facility])  Office Follow Up:  Does the office need to follow up with this patient?: No  Instructions For The Office: N/A   Symptoms  Reason For Call & Symptoms: Pt's wife/Donna states pt having espisodes of confusion, not sleeping at night, falling asleep more than normal in the day. Pt is losing weight.  Reviewed Health History In EMR: Yes  Reviewed Medications In EMR: Yes  Reviewed Allergies In EMR: Yes  Reviewed Surgeries / Procedures: Yes  Date of Onset of Symptoms: 04/21/2013  Guideline(s) Used:  Confusion - Delirium  Disposition Per Guideline:   See Today in Office  Reason For Disposition Reached:   Transient confusion (i.e., completely resolved)  Advice Given:  N/A  Patient Refused Recommendation:  Patient Refused Appt, Patient Requests Appt At Later Date  Pt refused appt today, and Pt's wife scheduled appt for Monday 05/23/13 at 10:30 AM.

## 2013-05-23 ENCOUNTER — Ambulatory Visit (INDEPENDENT_AMBULATORY_CARE_PROVIDER_SITE_OTHER): Payer: BC Managed Care – PPO | Admitting: Family Medicine

## 2013-05-23 ENCOUNTER — Encounter: Payer: Self-pay | Admitting: Family Medicine

## 2013-05-23 VITALS — BP 134/100 | HR 96 | Temp 98.8°F | Wt 227.5 lb

## 2013-05-23 DIAGNOSIS — E291 Testicular hypofunction: Secondary | ICD-10-CM

## 2013-05-23 DIAGNOSIS — E7800 Pure hypercholesterolemia, unspecified: Secondary | ICD-10-CM

## 2013-05-23 DIAGNOSIS — R413 Other amnesia: Secondary | ICD-10-CM

## 2013-05-23 NOTE — Patient Instructions (Addendum)
Schedule a lab visit for early Friday AM.  We'll go from there.   Take care.

## 2013-05-23 NOTE — Assessment & Plan Note (Signed)
I am not sure that he has a true issue here.  He is still working and I doubt that he could do that with sig memory loss.  Sleep schedule could be playing a role here.  Would check basic labs (and his labs for his upcoming physical) and we'll go from there.  Reassuring exam.  He is alert and oriented with swift and accurate recall on testing today.

## 2013-05-23 NOTE — Progress Notes (Signed)
He was prev on androgel. He is off it now.    Wife was worried about confusion.  He had a trial off simvastatin w/o an improvement.  He doesn't give a history of profound memory loss.  Prev with normal CT and intact neuropsychological testing prev.  He doesn't have truly significant weight loss- prev values noted and discussed.   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  He is getting 7-8 hours of sleep each AM and early afternoon.  He takes an OTC sleep aid.  He doesn't have excessive snoring.  No sudden drowsy episodes. No known apneas, not waking gasping for air.    Meds, vitals, and allergies reviewed.   ROS: See HPI.  Otherwise, noncontributory.  GEN: nad, alert and oriented HEENT: mucous membranes moist NECK: supple w/o LA CV: rrr PULM: ctab, no inc wob ABD: soft, +bs EXT: no edema CN 2-12 wnl B, S/S/DTR wnl x4

## 2013-05-27 ENCOUNTER — Other Ambulatory Visit (INDEPENDENT_AMBULATORY_CARE_PROVIDER_SITE_OTHER): Payer: BC Managed Care – PPO

## 2013-05-27 DIAGNOSIS — E7800 Pure hypercholesterolemia, unspecified: Secondary | ICD-10-CM

## 2013-05-27 DIAGNOSIS — E291 Testicular hypofunction: Secondary | ICD-10-CM

## 2013-05-27 DIAGNOSIS — R413 Other amnesia: Secondary | ICD-10-CM

## 2013-05-27 LAB — COMPREHENSIVE METABOLIC PANEL
AST: 21 U/L (ref 0–37)
Alkaline Phosphatase: 41 U/L (ref 39–117)
BUN: 18 mg/dL (ref 6–23)
Creatinine, Ser: 1 mg/dL (ref 0.4–1.5)
Glucose, Bld: 95 mg/dL (ref 70–99)
Total Bilirubin: 0.7 mg/dL (ref 0.3–1.2)

## 2013-05-27 LAB — TSH: TSH: 1.44 u[IU]/mL (ref 0.35–5.50)

## 2013-05-27 LAB — LIPID PANEL
Cholesterol: 156 mg/dL (ref 0–200)
Total CHOL/HDL Ratio: 3
Triglycerides: 82 mg/dL (ref 0.0–149.0)

## 2013-05-27 LAB — TESTOSTERONE: Testosterone: 240.37 ng/dL — ABNORMAL LOW (ref 350.00–890.00)

## 2013-05-27 LAB — LUTEINIZING HORMONE: LH: 2.32 m[IU]/mL (ref 1.50–9.30)

## 2013-06-02 ENCOUNTER — Other Ambulatory Visit: Payer: Self-pay | Admitting: Family Medicine

## 2013-06-02 DIAGNOSIS — E349 Endocrine disorder, unspecified: Secondary | ICD-10-CM

## 2013-06-27 DIAGNOSIS — N529 Male erectile dysfunction, unspecified: Secondary | ICD-10-CM | POA: Insufficient documentation

## 2013-06-27 DIAGNOSIS — E291 Testicular hypofunction: Secondary | ICD-10-CM | POA: Insufficient documentation

## 2013-06-27 DIAGNOSIS — N400 Enlarged prostate without lower urinary tract symptoms: Secondary | ICD-10-CM | POA: Insufficient documentation

## 2013-06-27 DIAGNOSIS — R6882 Decreased libido: Secondary | ICD-10-CM | POA: Insufficient documentation

## 2013-07-08 ENCOUNTER — Other Ambulatory Visit: Payer: BC Managed Care – PPO

## 2013-07-15 ENCOUNTER — Encounter: Payer: Self-pay | Admitting: Family Medicine

## 2013-07-15 ENCOUNTER — Ambulatory Visit (INDEPENDENT_AMBULATORY_CARE_PROVIDER_SITE_OTHER): Payer: BC Managed Care – PPO | Admitting: Family Medicine

## 2013-07-15 VITALS — BP 140/88 | HR 88 | Temp 98.2°F | Ht 75.0 in | Wt 228.5 lb

## 2013-07-15 DIAGNOSIS — Z0000 Encounter for general adult medical examination without abnormal findings: Secondary | ICD-10-CM

## 2013-07-15 DIAGNOSIS — R413 Other amnesia: Secondary | ICD-10-CM

## 2013-07-15 DIAGNOSIS — E785 Hyperlipidemia, unspecified: Secondary | ICD-10-CM

## 2013-07-15 MED ORDER — SIMVASTATIN 40 MG PO TABS: 40.0000 mg | ORAL_TABLET | Freq: Every day | ORAL | Status: DC

## 2013-07-15 NOTE — Patient Instructions (Addendum)
Check with your insurance to see if they will cover the shingles shot. I would get a flu shot each fall.   Take care.  Glad to see you.  

## 2013-07-15 NOTE — Progress Notes (Signed)
CPE- See plan.  Routine anticipatory guidance given to patient.  See health maintenance. Tetanus 2011 Flu shot prev done Check with your insurance to see if they will cover the shingles shot. Colonoscopy 2010 Prostate cancer screening and PSA options (with potential risks and benefits of testing vs not testing) were discussed along with recent recs/guidelines.  He declined testing PSA at this point. Living will d/w pt.  He has one.  Wife is designated if incapacitated.  Diet and exercise d/w pt.  "real good" on both.    Memory changes. Likely w/o sig troubles with memory, s/p testing.  He isn't waking gasping for air.  He wakes fairly refreshed given his work schedule.  He occ snores.  He saw uro about the T level prev.  He declined treatment with T per uro.    Elevated Cholesterol: Using medications without problems:yes Muscle aches: no Diet compliance:yes Exercise:yes  PMH and SH reviewed  Meds, vitals, and allergies reviewed.   ROS: See HPI.  Otherwise negative.    GEN: nad, alert and oriented HEENT: mucous membranes moist NECK: supple w/o LA CV: rrr. PULM: ctab, no inc wob ABD: soft, +bs EXT: no edema SKIN: no acute rash

## 2013-07-17 NOTE — Assessment & Plan Note (Signed)
Likely w/o sig troubles with memory, s/p testing.  Unlikely to have OSA.  He isn't waking gasping for air.  He wakes fairly refreshed given his work schedule.  He occ snores.  He saw uro about the T level prev.  He declined treatment with T per uro.

## 2013-07-17 NOTE — Assessment & Plan Note (Signed)
Routine anticipatory guidance given to patient.  See health maintenance. Tetanus 2011 Flu shot prev done Check with your insurance to see if they will cover the shingles shot. Colonoscopy 2010 Prostate cancer screening and PSA options (with potential risks and benefits of testing vs not testing) were discussed along with recent recs/guidelines.  He declined testing PSA at this point. Living will d/w pt.  He has one.  Wife is designated if incapacitated.  Diet and exercise d/w pt.  "real good" on both.

## 2013-07-17 NOTE — Assessment & Plan Note (Signed)
Controlled, continue as is.  Labs d/w pt.  

## 2013-10-07 ENCOUNTER — Ambulatory Visit (INDEPENDENT_AMBULATORY_CARE_PROVIDER_SITE_OTHER): Payer: BC Managed Care – PPO | Admitting: *Deleted

## 2013-10-07 DIAGNOSIS — Z2911 Encounter for prophylactic immunotherapy for respiratory syncytial virus (RSV): Secondary | ICD-10-CM

## 2013-10-07 DIAGNOSIS — Z23 Encounter for immunization: Secondary | ICD-10-CM

## 2013-10-27 ENCOUNTER — Other Ambulatory Visit: Payer: Self-pay

## 2014-06-12 ENCOUNTER — Other Ambulatory Visit: Payer: BC Managed Care – PPO

## 2014-06-16 ENCOUNTER — Encounter: Payer: BC Managed Care – PPO | Admitting: Family Medicine

## 2014-07-23 ENCOUNTER — Other Ambulatory Visit: Payer: Self-pay | Admitting: Family Medicine

## 2014-07-23 DIAGNOSIS — E785 Hyperlipidemia, unspecified: Secondary | ICD-10-CM

## 2014-07-24 ENCOUNTER — Other Ambulatory Visit (INDEPENDENT_AMBULATORY_CARE_PROVIDER_SITE_OTHER): Payer: BC Managed Care – PPO

## 2014-07-24 DIAGNOSIS — E785 Hyperlipidemia, unspecified: Secondary | ICD-10-CM

## 2014-07-24 LAB — COMPREHENSIVE METABOLIC PANEL
ALBUMIN: 4.2 g/dL (ref 3.5–5.2)
ALT: 22 U/L (ref 0–53)
AST: 21 U/L (ref 0–37)
Alkaline Phosphatase: 40 U/L (ref 39–117)
BILIRUBIN TOTAL: 0.6 mg/dL (ref 0.2–1.2)
BUN: 16 mg/dL (ref 6–23)
CO2: 29 meq/L (ref 19–32)
Calcium: 8.9 mg/dL (ref 8.4–10.5)
Chloride: 103 mEq/L (ref 96–112)
Creatinine, Ser: 1.1 mg/dL (ref 0.4–1.5)
GFR: 74.6 mL/min (ref >60.00)
GLUCOSE: 97 mg/dL (ref 70–99)
Potassium: 4.1 mEq/L (ref 3.5–5.1)
SODIUM: 137 meq/L (ref 135–145)
TOTAL PROTEIN: 6.8 g/dL (ref 6.0–8.3)

## 2014-07-24 LAB — LIPID PANEL
CHOLESTEROL: 142 mg/dL (ref 0–200)
HDL: 48.1 mg/dL (ref >39.00)
LDL Cholesterol: 80 mg/dL (ref 0–99)
NonHDL: 93.9
Total CHOL/HDL Ratio: 3
Triglycerides: 68 mg/dL (ref 0.0–149.0)
VLDL: 13.6 mg/dL (ref 0.0–40.0)

## 2014-07-28 ENCOUNTER — Encounter: Payer: Self-pay | Admitting: Family Medicine

## 2014-07-28 ENCOUNTER — Ambulatory Visit (INDEPENDENT_AMBULATORY_CARE_PROVIDER_SITE_OTHER): Payer: BC Managed Care – PPO | Admitting: Family Medicine

## 2014-07-28 VITALS — BP 140/88 | HR 81 | Temp 97.9°F | Ht 74.0 in | Wt 230.2 lb

## 2014-07-28 DIAGNOSIS — Z7189 Other specified counseling: Secondary | ICD-10-CM | POA: Insufficient documentation

## 2014-07-28 DIAGNOSIS — Z0000 Encounter for general adult medical examination without abnormal findings: Secondary | ICD-10-CM

## 2014-07-28 DIAGNOSIS — E785 Hyperlipidemia, unspecified: Secondary | ICD-10-CM

## 2014-07-28 MED ORDER — SIMVASTATIN 40 MG PO TABS: 40.0000 mg | ORAL_TABLET | Freq: Every day | ORAL | Status: DC

## 2014-07-28 NOTE — Patient Instructions (Signed)
Take care.  Keep exercising and working on your diet.  I would get a flu shot each fall.   Glad to see you.  Recheck next year.

## 2014-07-28 NOTE — Progress Notes (Signed)
Pre visit review using our clinic review tool, if applicable. No additional management support is needed unless otherwise documented below in the visit note.  CPE- See plan.  Routine anticipatory guidance given to patient.  See health maintenance. Tetanus 2011 Flu done at work PNA at 41 Shingles 2014 AAA screening not due.  D/w pt.   Colonoscopy 2010 Prostate cancer screening and PSA options (with potential risks and benefits of testing vs not testing) were discussed along with recent recs/guidelines.  He declined testing PSA at this point. Living will d/w pt.  Would have his wife designated if patient were incapacitated.   Diet and exercise d/w pt.  Doing well with both.  Yard work and walking.  He's considering joining the gym.  Diet is good.   Working nights, doing well.  No sig memory changes.    Elevated Cholesterol: Using medications without problems:yes Muscle aches: no Diet compliance:yes Exercise:yes  PMH and SH reviewed  Meds, vitals, and allergies reviewed.   ROS: See HPI.  Otherwise negative.    GEN: nad, alert and oriented HEENT: mucous membranes moist NECK: supple w/o LA CV: rrr. PULM: ctab, no inc wob ABD: soft, +bs EXT: no edema SKIN: no acute rash

## 2014-07-28 NOTE — Assessment & Plan Note (Signed)
Routine anticipatory guidance given to patient.  See health maintenance. Tetanus 2011 Flu done at work PNA at 64 Shingles 2014 AAA screening not due.  D/w pt.   Colonoscopy 2010 Prostate cancer screening and PSA options (with potential risks and benefits of testing vs not testing) were discussed along with recent recs/guidelines.  He declined testing PSA at this point. Living will d/w pt.  Would have his wife designated if he were incapacitated.   Diet and exercise d/w pt.  Doing well with both.  Yard work and walking.  He's considering joining the gym.  Diet is good.   Working nights, doing well.  No sig memory changes.

## 2014-07-28 NOTE — Assessment & Plan Note (Signed)
Controlled, continue diet, exercise, statin. Doing well.

## 2015-07-27 ENCOUNTER — Other Ambulatory Visit: Payer: Self-pay | Admitting: Family Medicine

## 2015-07-27 DIAGNOSIS — E785 Hyperlipidemia, unspecified: Secondary | ICD-10-CM

## 2015-07-29 ENCOUNTER — Other Ambulatory Visit: Payer: Self-pay | Admitting: Family Medicine

## 2015-07-30 ENCOUNTER — Other Ambulatory Visit (INDEPENDENT_AMBULATORY_CARE_PROVIDER_SITE_OTHER): Payer: 59

## 2015-07-30 DIAGNOSIS — E785 Hyperlipidemia, unspecified: Secondary | ICD-10-CM | POA: Diagnosis not present

## 2015-07-30 LAB — COMPREHENSIVE METABOLIC PANEL
ALBUMIN: 4.4 g/dL (ref 3.5–5.2)
ALT: 15 U/L (ref 0–53)
AST: 15 U/L (ref 0–37)
Alkaline Phosphatase: 41 U/L (ref 39–117)
BUN: 14 mg/dL (ref 6–23)
CO2: 29 mEq/L (ref 19–32)
CREATININE: 1.08 mg/dL (ref 0.40–1.50)
Calcium: 9.2 mg/dL (ref 8.4–10.5)
Chloride: 105 mEq/L (ref 96–112)
GFR: 73.56 mL/min (ref >60.00)
Glucose, Bld: 106 mg/dL — ABNORMAL HIGH (ref 70–99)
POTASSIUM: 4.1 meq/L (ref 3.5–5.1)
Sodium: 142 mEq/L (ref 135–145)
Total Bilirubin: 0.5 mg/dL (ref 0.2–1.2)
Total Protein: 6.9 g/dL (ref 6.0–8.3)

## 2015-07-30 LAB — LIPID PANEL
Cholesterol: 163 mg/dL (ref 0–200)
HDL: 50.5 mg/dL (ref >39.00)
LDL Cholesterol: 94 mg/dL (ref 0–99)
NonHDL: 112.13
Total CHOL/HDL Ratio: 3
Triglycerides: 92 mg/dL (ref 0.0–149.0)
VLDL: 18.4 mg/dL (ref 0.0–40.0)

## 2015-08-02 ENCOUNTER — Encounter: Payer: Self-pay | Admitting: Family Medicine

## 2015-08-02 ENCOUNTER — Ambulatory Visit (INDEPENDENT_AMBULATORY_CARE_PROVIDER_SITE_OTHER): Payer: 59 | Admitting: Family Medicine

## 2015-08-02 VITALS — BP 128/86 | HR 78 | Temp 98.3°F | Ht 74.0 in | Wt 224.0 lb

## 2015-08-02 DIAGNOSIS — Z0000 Encounter for general adult medical examination without abnormal findings: Secondary | ICD-10-CM

## 2015-08-02 DIAGNOSIS — E785 Hyperlipidemia, unspecified: Secondary | ICD-10-CM

## 2015-08-02 MED ORDER — SIMVASTATIN 40 MG PO TABS: ORAL_TABLET | ORAL | Status: DC

## 2015-08-02 NOTE — Assessment & Plan Note (Signed)
Controlled, continue as is.  Labs d/w pt.  Benefit likely >> risk for current med.  He agrees.  Continue exercise.  Mild inc in sugar, d/w pt about cutting out some sweets.

## 2015-08-02 NOTE — Patient Instructions (Signed)
Take care.  Glad to see you.  Keep exercising.  Recheck in about 1 year.  I would get a flu shot each fall.

## 2015-08-02 NOTE — Assessment & Plan Note (Signed)
Routine anticipatory guidance given to patient. See health maintenance.  Tetanus 2011  Flu done at work  PNA at 60  Shingles 2014  AAA screening not due. D/w pt.  Colonoscopy 2010  Prostate cancer screening and PSA options (with potential risks and benefits of testing vs not testing) were discussed along with recent recs/guidelines. He declined testing PSA at this point.  Living will d/w pt. Would have his wife designated if patient were incapacitated.  Diet and exercise d/w pt. Doing well with both. Yard work and walking. Diet is good.  Working nights, doing well. No sig memory changes.

## 2015-08-02 NOTE — Progress Notes (Signed)
Pre visit review using our clinic review tool, if applicable. No additional management support is needed unless otherwise documented below in the visit note.  CPE- See plan.  Routine anticipatory guidance given to patient.  See health maintenance. Tetanus 2011 Flu done at work PNA at 8 Shingles 2014 AAA screening not due. D/w pt.  Colonoscopy 2010 Prostate cancer screening and PSA options (with potential risks and benefits of testing vs not testing) were discussed along with recent recs/guidelines. He declined testing PSA at this point. Living will d/w pt. Would have his wife designated if patient were incapacitated.  Diet and exercise d/w pt. Doing well with both. Yard work and walking. Diet is good.  Working nights, doing well. No sig memory changes.   Elevated Cholesterol: Using medications without problems:yes Muscle aches: no Diet compliance:yes Exercise:yes  PMH and SH reviewed  Meds, vitals, and allergies reviewed.   ROS: See HPI.  Otherwise negative.    GEN: nad, alert and oriented HEENT: mucous membranes moist NECK: supple w/o LA CV: rrr. PULM: ctab, no inc wob ABD: soft, +bs EXT: no edema SKIN: no acute rash

## 2015-11-05 ENCOUNTER — Encounter: Payer: Self-pay | Admitting: Gastroenterology

## 2016-07-23 ENCOUNTER — Telehealth: Payer: Self-pay

## 2016-07-23 ENCOUNTER — Encounter: Payer: Self-pay | Admitting: Family Medicine

## 2016-07-23 DIAGNOSIS — Z8042 Family history of malignant neoplasm of prostate: Secondary | ICD-10-CM | POA: Insufficient documentation

## 2016-07-23 DIAGNOSIS — E785 Hyperlipidemia, unspecified: Secondary | ICD-10-CM

## 2016-07-23 DIAGNOSIS — Z125 Encounter for screening for malignant neoplasm of prostate: Secondary | ICD-10-CM

## 2016-07-23 NOTE — Telephone Encounter (Signed)
Thanks

## 2016-07-23 NOTE — Telephone Encounter (Signed)
1. I hate to hear about his family members being sick.   2. When we had discussed prev, my understanding was that he didn't have FH of prostate cancer.  I updated his chart in the meantime.  3. We would have talked about this at the OV anyway, ie I would have asked him about this.  4. I put in the routine orders, psa etc.  Ask him about HIV and HCV screening- this is routine now, to be done once in a lifetime if not already done.  Not mandatory, but if he wants done then I can order.  I would have d/w pt at OV.  This isn't mandatory.   I didn't order those yet.  Thanks.

## 2016-07-23 NOTE — Telephone Encounter (Signed)
Patient's wife Lupita Leash) notified as instructed by telephone and verbalized understanding. Lupita Leash stated patient is not at home now and she will have him talk with Dr. Para March about the additional lab work when he comes in for his appointment.

## 2016-07-23 NOTE — Telephone Encounter (Signed)
Left message on answering machine for patient to call back.  

## 2016-07-23 NOTE — Telephone Encounter (Signed)
Lupita Leash (DPR signed) left v/m requesting pt to have PSA when has labs already ordered on 08/01/16. Pt has family members that are dying from prostate CA. Pt last PSA per lab list 07/09/2012. Lupita Leash request cb.

## 2016-08-01 ENCOUNTER — Other Ambulatory Visit (INDEPENDENT_AMBULATORY_CARE_PROVIDER_SITE_OTHER): Payer: 59

## 2016-08-01 DIAGNOSIS — E785 Hyperlipidemia, unspecified: Secondary | ICD-10-CM

## 2016-08-01 DIAGNOSIS — Z125 Encounter for screening for malignant neoplasm of prostate: Secondary | ICD-10-CM

## 2016-08-01 LAB — LIPID PANEL
CHOL/HDL RATIO: 3
Cholesterol: 151 mg/dL (ref 0–200)
HDL: 51.3 mg/dL (ref >39.00)
LDL Cholesterol: 80 mg/dL (ref 0–99)
NonHDL: 99.69
TRIGLYCERIDES: 96 mg/dL (ref 0.0–149.0)
VLDL: 19.2 mg/dL (ref 0.0–40.0)

## 2016-08-01 LAB — COMPREHENSIVE METABOLIC PANEL
ALK PHOS: 42 U/L (ref 39–117)
ALT: 13 U/L (ref 0–53)
AST: 14 U/L (ref 0–37)
Albumin: 4.5 g/dL (ref 3.5–5.2)
BILIRUBIN TOTAL: 0.6 mg/dL (ref 0.2–1.2)
BUN: 25 mg/dL — ABNORMAL HIGH (ref 6–23)
CALCIUM: 9.2 mg/dL (ref 8.4–10.5)
CO2: 30 meq/L (ref 19–32)
Chloride: 105 mEq/L (ref 96–112)
Creatinine, Ser: 1.13 mg/dL (ref 0.40–1.50)
GFR: 69.59 mL/min (ref >60.00)
GLUCOSE: 87 mg/dL (ref 70–99)
POTASSIUM: 4.1 meq/L (ref 3.5–5.1)
Sodium: 142 mEq/L (ref 135–145)
Total Protein: 6.6 g/dL (ref 6.0–8.3)

## 2016-08-01 LAB — PSA: PSA: 1.42 ng/mL (ref 0.10–4.00)

## 2016-08-05 ENCOUNTER — Encounter: Payer: 59 | Admitting: Family Medicine

## 2016-08-05 ENCOUNTER — Other Ambulatory Visit: Payer: 59

## 2016-08-12 ENCOUNTER — Ambulatory Visit (INDEPENDENT_AMBULATORY_CARE_PROVIDER_SITE_OTHER): Payer: 59 | Admitting: Family Medicine

## 2016-08-12 ENCOUNTER — Other Ambulatory Visit: Payer: Self-pay | Admitting: Family Medicine

## 2016-08-12 ENCOUNTER — Encounter: Payer: Self-pay | Admitting: Family Medicine

## 2016-08-12 VITALS — BP 116/60 | HR 92 | Temp 98.6°F | Wt 221.2 lb

## 2016-08-12 DIAGNOSIS — E785 Hyperlipidemia, unspecified: Secondary | ICD-10-CM

## 2016-08-12 DIAGNOSIS — Z0000 Encounter for general adult medical examination without abnormal findings: Secondary | ICD-10-CM

## 2016-08-12 DIAGNOSIS — Z8042 Family history of malignant neoplasm of prostate: Secondary | ICD-10-CM

## 2016-08-12 DIAGNOSIS — Z7189 Other specified counseling: Secondary | ICD-10-CM

## 2016-08-12 MED ORDER — SIMVASTATIN 40 MG PO TABS: ORAL_TABLET | ORAL | 3 refills | Status: DC

## 2016-08-12 NOTE — Assessment & Plan Note (Signed)
Continue diet, exercise, statin, d/w pt.  He agrees.  Labs d/w pt.

## 2016-08-12 NOTE — Progress Notes (Signed)
Pre visit review using our clinic review tool, if applicable. No additional management support is needed unless otherwise documented below in the visit note. 

## 2016-08-12 NOTE — Patient Instructions (Addendum)
It sounds like your brother may have had a viral cardiomyopathy but I can't say for sure.  Let me know in the meantime.  I wish both you and him well.   Take care.  Glad to see you.  I would get a flu shot each fall.   Update me as needed.   Recheck in about 1 year, sooner if needed.

## 2016-08-12 NOTE — Progress Notes (Signed)
CPE- See plan.  Routine anticipatory guidance given to patient.  See health maintenance. Tetanus 2011  Flu done at work  PNA at 20  Shingles 2014  AAA screening not due. D/w pt.  Colonoscopy 2010  PSA wnl, FH noted.  Living will d/w pt. Would have his wife designated if patient were incapacitated.   Diet and exercise d/w pt. Doing well with both. Yard work and walking. Diet is good.  Working nights, doing well. No sig memory changes.  Still doing his job and functioning well.   [facility].     His parents are still at home in [place].  D/w pt.  His father is ill.  This is a chronic strain for the patient.  Pt's mother wants to keep his father at home.   His brother recently got sick and may have had a viral cardiomyopathy.  D/w pt.    Elevated Cholesterol: Using medications without problems:yes Muscle aches: no Diet compliance:yes Exercise:yes Labs d/w pt.    PMH and SH reviewed  Meds, vitals, and allergies reviewed.   ROS: Per HPI.  Unless specifically indicated otherwise in HPI, the patient denies:  General: fever. Eyes: acute vision changes ENT: sore throat Cardiovascular: chest pain Respiratory: SOB GI: vomiting GU: dysuria Musculoskeletal: acute back pain Derm: acute rash Neuro: acute motor dysfunction Psych: worsening mood Endocrine: polydipsia Heme: bleeding Allergy: hayfever  GEN: nad, alert and oriented HEENT: mucous membranes moist NECK: supple w/o LA CV: rrr. PULM: ctab, no inc wob ABD: soft, +bs EXT: no edema SKIN: no acute rash

## 2016-08-12 NOTE — Assessment & Plan Note (Addendum)
Tetanus 2011  Flu done at work  PNA at 22  Shingles 2014  AAA screening not due. D/w pt.  Colonoscopy 2010  PSA wnl, FH noted.  Living will d/w pt. Would have his wife designated if patient were incapacitated.   Diet and exercise d/w pt. Doing well with both. Yard work and walking. Diet is good.  Working nights, doing well. No sig memory changes.  Still doing his job and functioning well.   [facility] HCV [facility]. He'll update me as needed about his family situation (parents, brother).  Support offered.

## 2016-08-12 NOTE — Assessment & Plan Note (Signed)
PSA wnl, d/w pt.  

## 2016-08-12 NOTE — Assessment & Plan Note (Signed)
Living will d/w pt. Would have his wife designated if he were incapacitated.  

## 2016-12-05 ENCOUNTER — Ambulatory Visit (INDEPENDENT_AMBULATORY_CARE_PROVIDER_SITE_OTHER): Payer: 59 | Admitting: Family Medicine

## 2016-12-05 ENCOUNTER — Encounter: Payer: Self-pay | Admitting: Family Medicine

## 2016-12-05 VITALS — BP 130/82 | HR 107 | Temp 98.6°F | Wt 219.0 lb

## 2016-12-05 DIAGNOSIS — I499 Cardiac arrhythmia, unspecified: Secondary | ICD-10-CM

## 2016-12-05 LAB — CBC WITH DIFFERENTIAL/PLATELET
BASOS PCT: 0.7 % (ref 0.0–3.0)
Basophils Absolute: 0 10*3/uL (ref 0.0–0.1)
EOS PCT: 2.5 % (ref 0.0–5.0)
Eosinophils Absolute: 0.2 10*3/uL (ref 0.0–0.7)
HEMATOCRIT: 45.1 % (ref 39.0–52.0)
Hemoglobin: 15.2 g/dL (ref 13.0–17.0)
LYMPHS PCT: 16.2 % (ref 12.0–46.0)
Lymphs Abs: 1 10*3/uL (ref 0.7–4.0)
MCHC: 33.7 g/dL (ref 30.0–36.0)
MCV: 87.8 fl (ref 78.0–100.0)
MONOS PCT: 8.4 % (ref 3.0–12.0)
Monocytes Absolute: 0.5 10*3/uL (ref 0.1–1.0)
NEUTROS ABS: 4.6 10*3/uL (ref 1.4–7.7)
Neutrophils Relative %: 72.2 % (ref 43.0–77.0)
PLATELETS: 159 10*3/uL (ref 150.0–400.0)
RBC: 5.13 Mil/uL (ref 4.22–5.81)
RDW: 13.4 % (ref 11.5–15.5)
WBC: 6.4 10*3/uL (ref 4.0–10.5)

## 2016-12-05 LAB — COMPREHENSIVE METABOLIC PANEL
ALT: 20 U/L (ref 0–53)
AST: 18 U/L (ref 0–37)
Albumin: 4.5 g/dL (ref 3.5–5.2)
Alkaline Phosphatase: 45 U/L (ref 39–117)
BILIRUBIN TOTAL: 0.7 mg/dL (ref 0.2–1.2)
BUN: 16 mg/dL (ref 6–23)
CALCIUM: 9.1 mg/dL (ref 8.4–10.5)
CO2: 32 meq/L (ref 19–32)
Chloride: 105 mEq/L (ref 96–112)
Creatinine, Ser: 0.93 mg/dL (ref 0.40–1.50)
GFR: 87.03 mL/min (ref >60.00)
Glucose, Bld: 90 mg/dL (ref 70–99)
POTASSIUM: 4 meq/L (ref 3.5–5.1)
Sodium: 142 mEq/L (ref 135–145)
Total Protein: 6.7 g/dL (ref 6.0–8.3)

## 2016-12-05 LAB — TSH: TSH: 1.1 u[IU]/mL (ref 0.35–4.50)

## 2016-12-05 MED ORDER — METOPROLOL SUCCINATE ER 25 MG PO TB24: 25.0000 mg | ORAL_TABLET | Freq: Every day | ORAL | 3 refills | Status: DC

## 2016-12-05 NOTE — Progress Notes (Signed)
Pre visit review using our clinic review tool, if applicable. No additional management support is needed unless otherwise documented below in the visit note. 

## 2016-12-05 NOTE — Patient Instructions (Addendum)
Go to the lab on the way out.  We'll contact you with your lab report. Shirlee Limerick will call about your referral. Start metoprorol in the meantime.  If you have chest pain or shortness of breath, then go to the ER.  Take care.  Glad to see you.

## 2016-12-05 NOTE — Progress Notes (Signed)
Irregular pulse noted in the last week or so.  Not racing.  No CP.  Not SOB, no BLE edema.  No h/o heart rhythm troubles.  No syncope, not lightheaded.   He had gone to outpatient health screening.  He wanted f/u re: EKG but it isn't a full EKG for review, doesn't have V1-V6.  12 lead done today at OV.  D/w pt.   Brother has h/o CAD with pacer/defib placement but patient has no such cardiac hx.    PMH and SH reviewed  ROS: Per HPI unless specifically indicated in ROS section   Meds, vitals, and allergies reviewed.   GEN: nad, alert and oriented HEENT: mucous membranes moist NECK: supple w/o LA CV: IRR, mildly tachy PULM: ctab, no inc wob ABD: soft, +bs EXT: no edema

## 2016-12-07 NOTE — Assessment & Plan Note (Signed)
Nontoxic. EKG discussed with patient. Multiple PVCs noted. Nontoxic. Okay for outpatient follow-up. Refer to cardiology. Check basic labs. Start metoprolol. Routine cautions given. If heart racing or chest pain then go to ER or down 911. He agrees. Taper caffeine in the meantime. >25 minutes spent in face to face time with patient, >50% spent in counselling or coordination of care.

## 2016-12-08 ENCOUNTER — Ambulatory Visit (INDEPENDENT_AMBULATORY_CARE_PROVIDER_SITE_OTHER): Payer: 59 | Admitting: Physician Assistant

## 2016-12-08 ENCOUNTER — Encounter: Payer: Self-pay | Admitting: Physician Assistant

## 2016-12-08 VITALS — BP 156/76 | HR 60 | Ht 74.0 in | Wt 223.8 lb

## 2016-12-08 DIAGNOSIS — Z136 Encounter for screening for cardiovascular disorders: Secondary | ICD-10-CM | POA: Diagnosis not present

## 2016-12-08 DIAGNOSIS — I493 Ventricular premature depolarization: Secondary | ICD-10-CM | POA: Diagnosis not present

## 2016-12-08 DIAGNOSIS — Z8249 Family history of ischemic heart disease and other diseases of the circulatory system: Secondary | ICD-10-CM | POA: Insufficient documentation

## 2016-12-08 DIAGNOSIS — E785 Hyperlipidemia, unspecified: Secondary | ICD-10-CM | POA: Diagnosis not present

## 2016-12-08 NOTE — Progress Notes (Signed)
Cardiology Office Note    Date:  12/08/2016   ID:  DUPREE GIVLER, DOB 22-Aug-1951, MRN [id number]  PCP:  Crawford Givens, MD  Cardiologist: new  Chief Complaint  Patient presents with  . Irregular Heart Beat    History of Present Illness:  Randy Pittman is a 65 y.o. male with no prior cardiac history. Had a Life Line screening and had an irregular heart beat and told to see primary care. He had a lot of PVC's on EKG and referred here.  Identical twin had MI at 32 and now has EF 15% and defibrillator. He was a smoker. Brother died at 45 with congenital heart disease. Uncle died at 71 MI.  Patient is very active. Works nights at [employer]. Does all his own yard work. Denies chest pain, dyspnea, dyspnea on exertion, dizziness or presyncope. Has HLD, no HTN, DM, smoked for 5 yrs.    Past Medical History:  Diagnosis Date  . Hyperlipidemia 07/1995  . Low testosterone    per Dr. Reuel Boom, with urology    No past surgical history on file.  Current Medications: Outpatient Medications Prior to Visit  Medication Sig Dispense Refill  . aspirin 81 MG tablet Take 81 mg by mouth daily.      . cholecalciferol (VITAMIN D) 1000 UNITS tablet Take 1,000 Units by mouth daily.    . metoprolol succinate (TOPROL-XL) 25 MG 24 hr tablet Take 1 tablet (25 mg total) by mouth daily. 90 tablet 3  . simvastatin (ZOCOR) 40 MG tablet Take 1 by mouth at bedtime 90 tablet 3  . vitamin B-12 (CYANOCOBALAMIN) 1000 MCG tablet Take 1,000 mcg by mouth daily.     No facility-administered medications prior to visit.      Allergies:   Patient has no known allergies.   Social History   Social History  . Marital status: Married    Spouse name: N/A  . Number of children: N/A  . Years of education: N/A   Occupational History  . [employer]    Social History Main Topics  . Smoking status: Former Games developer  . Smokeless tobacco: Never Used     Comment: quit in 1980  . Alcohol use Yes     Comment: 4-5 drinks  a week  . Drug use: No  . Sexual activity: Not Asked   Other Topics Concern  . 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   ROS:   Please see the history of present illness.    Review of Systems  Constitution: Negative.  HENT: Negative.   Cardiovascular: Negative.   Respiratory: Negative.   Endocrine: Negative.   Hematologic/Lymphatic: Negative.   Musculoskeletal: Negative.   Gastrointestinal: Negative.   Genitourinary: Negative.   Neurological: Negative.    All other systems reviewed and are negative.   PHYSICAL EXAM:   VS:  BP (!) 156/76 (BP Location: Right Arm, Patient Position: Sitting, Cuff Size: Normal)   Pulse 60   Ht 6\' 2"  (1.88 m)   Wt 223 lb 12.8 oz (101.5 kg)   SpO2 97%   BMI 28.73 kg/m  Physical Exam  GEN: Well nourished, well developed, in no acute distress  Neck: no JVD, carotid bruits, or masses Cardiac:RRR; no murmurs, rubs, or gallops  Respiratory:  clear to auscultation bilaterally, normal work of breathing GI: soft, nontender, nondistended, + BS Ext: without cyanosis, clubbing, or edema, Good distal pulses bilaterally MS: no deformity or atrophy  Skin: warm and dry, no rash Psych: euthymic mood, full affect  Wt Readings from Last 3 Encounters:  12/08/16 223 lb 12.8 oz (101.5 kg)  12/05/16 219 lb (99.3 kg)  08/12/16 221 lb 4 oz (100.4 kg)      Studies/Labs Reviewed:   EKG:  EKG is ordered today.  The ekg ordered today demonstrates Normal sinus rhythm with frequent PVCs  Recent Labs: 12/05/2016: ALT 20; BUN 16; Creatinine, Ser 0.93; Hemoglobin 15.2; Platelets 159.0; Potassium 4.0; Sodium 142; TSH  1.10   Lipid Panel    Component Value Date/Time   CHOL 151 08/01/2016 0816   TRIG 96.0 08/01/2016 0816   HDL 51.30 08/01/2016 0816   CHOLHDL 3 08/01/2016 0816   VLDL 19.2 08/01/2016 0816   LDLCALC 80 08/01/2016 0816   LDLDIRECT 169.4 07/09/2012 1021    Additional studies/ records that were reviewed today include:      ASSESSMENT:    1. Screening for cardiovascular condition   2. Hyperlipidemia, unspecified hyperlipidemia type   3. PVC's (premature ventricular contractions)   4. Family history of early CAD      PLAN:  In order of problems listed above:  Screening for cardiovascular condition. Patient's twin brother had an MI in his 16s and has an EF of 15% with a defibrillator. They she has hyperlipidemia that is being treated for. Having frequent PVCs. Discussed patient with Dr. Excell Seltzer recommend stress Myoview and 2-D echo. Follow-up with me in one month and establish with cardiologist in 2-3 months  PVCs patient is asymptomatic. Metoprolol prescription given by primary care but patient has not started recommend he go ahead and start this. If LV function normal on 2-D echo will not order a Holter monitor but if LV function is abnormal consider Holter  Family history of early CAD with an uncle dying at 50 of an MI in a twin brother at 34 with an MI    Medication Adjustments/Labs and Tests Ordered: Current medicines are reviewed at length with the patient today.  Concerns regarding medicines are outlined above.  Medication changes, Labs and Tests ordered today are listed in the Patient Instructions below. Patient Instructions  Medication Instructions:  Your physician recommends that you continue on your current medications as directed. Please refer to the Current Medication list given to you today.  1. START the Metoprolol   Labwork: None ordered  Testing/Procedures: Your physician has requested that you have en exercise stress myoview. For further information please  visit [email or web address] Please follow instruction sheet, as given.   Your physician has requested that you have an echocardiogram. Echocardiography is a painless test that uses sound waves to create images of your heart. It provides your doctor with information about the size and shape of your heart and how well your heart's chambers and valves are working. This procedure takes approximately one hour. There are no restrictions for this procedure.   Follow-Up: Your physician recommends that you schedule a follow-up appointment in: 1 MONTH WITH Jacolyn Reedy, PA-C  Your physician recommends that you schedule a follow-up appointment in: 2-3 MONTHS WITH DR. Delton See     Any Other Special Instructions Will Be  Listed Below (If Applicable).  Exercise Stress Electrocardiogram An exercise stress electrocardiogram is a test to check how blood flows to your heart. It is done to find areas of poor blood flow. You will need to walk on a treadmill for this test. The electrocardiogram will record your heartbeat when you are at rest and when you are exercising. What happens before the procedure?  Do not have drinks with caffeine or foods with caffeine for 24 hours before the test, or as told by your doctor. This includes coffee, tea (even decaf tea), sodas, chocolate, and cocoa.  Follow your doctor's instructions about eating and drinking before the test.  Ask your doctor what medicines you should or should not take before the test. Take your medicines with water unless told by your doctor not to.  If you use an inhaler, bring it with you to the test.  Bring a snack to eat after the test.  Do not  smoke for 4 hours before the test.  Do not put lotions, powders, creams, or oils on your chest before the test.  Wear comfortable shoes and clothing. What happens during the procedure?  You will have patches put on your chest. Small areas of your chest may need to be shaved. Wires will be connected to  the patches.  Your heart rate will be watched while you are resting and while you are exercising.  You will walk on the treadmill. The treadmill will slowly get faster to raise your heart rate.  The test will take about 1-2 hours. What happens after the procedure?  Your heart rate and blood pressure will be watched after the test.  You may return to your normal diet, activities, and medicines or as told by your doctor. This information is not intended to replace advice given to you by your health care provider. Make sure you discuss any questions you have with your health care provider. Document Released: 05/26/2008 Document Revised: 08/06/2016 Document Reviewed: 08/15/2013 Elsevier Interactive Patient Education  2017 Elsevier Inc.     Echocardiogram An echocardiogram, or echocardiography, uses sound waves (ultrasound) to produce an image of your heart. The echocardiogram is simple, painless, obtained within a short period of time, and offers valuable information to your health care provider. The images from an echocardiogram can provide information such as:  Evidence of coronary artery disease (CAD).  Heart size.  Heart muscle function.  Heart valve function.  Aneurysm detection.  Evidence of a past heart attack.  Fluid buildup around the heart.  Heart muscle thickening.  Assess heart valve function. Tell a health care provider about:  Any allergies you have.  All medicines you are taking, including vitamins, herbs, eye drops, creams, and over-the-counter medicines.  Any problems you or family members have had with anesthetic medicines.  Any blood disorders you have.  Any surgeries you have had.  Any medical conditions you have.  Whether you are pregnant or may be pregnant. What happens before the procedure? No special preparation is needed. Eat and drink normally. What happens during the procedure?  In order to produce an image of your heart, gel will be  applied to your chest and a wand-like tool (transducer) will be moved over your chest. The gel will help transmit the sound waves from the transducer. The sound waves will harmlessly bounce off your heart to allow the heart images to be captured in real-time motion. These images will then be recorded.  You may need an IV to receive a  medicine that improves the quality of the pictures. What happens after the procedure? You may return to your normal schedule including diet, activities, and medicines, unless your health care provider tells you otherwise. This information is not intended to replace advice given to you by your health care provider. Make sure you discuss any questions you have with your health care provider. Document Released: 12/05/2000 Document Revised: 07/26/2016 Document Reviewed: 08/15/2013 Elsevier Interactive Patient Education  2017 [employer].   If you need a refill on your cardiac medications before your next appointment, please call your pharmacy.      Elson Clan, PA-C  12/08/2016 12:11 PM    [facility] [address] [place], [place], [place]  [address] Phone: [phone]; Fax: [phone]

## 2016-12-08 NOTE — Patient Instructions (Addendum)
Medication Instructions:  Your physician recommends that you continue on your current medications as directed. Please refer to the Current Medication list given to you today.  1. START the Metoprolol   Labwork: None ordered  Testing/Procedures: Your physician has requested that you have en exercise stress myoview. For further information please visit [email or web address] Please follow instruction sheet, as given.   Your physician has requested that you have an echocardiogram. Echocardiography is a painless test that uses sound waves to create images of your heart. It provides your doctor with information about the size and shape of your heart and how well your heart's chambers and valves are working. This procedure takes approximately one hour. There are no restrictions for this procedure.   Follow-Up: Your physician recommends that you schedule a follow-up appointment in: 1 MONTH WITH Jacolyn ReedyMICHELE LENZE, PA-C  Your physician recommends that you schedule a follow-up appointment in: 2-3 MONTHS WITH DR. Delton SeeNELSON     Any Other Special Instructions Will Be Listed Below (If Applicable).  Exercise Stress Electrocardiogram An exercise stress electrocardiogram is a test to check how blood flows to your heart. It is done to find areas of poor blood flow. You will need to walk on a treadmill for this test. The electrocardiogram will record your heartbeat when you are at rest and when you are exercising. What happens before the procedure?  Do not have drinks with caffeine or foods with caffeine for 24 hours before the test, or as told by your doctor. This includes coffee, tea (even decaf tea), sodas, chocolate, and cocoa.  Follow your doctor's instructions about eating and drinking before the test.  Ask your doctor what medicines you should or should not take before the test. Take your medicines with water unless told by your doctor not to.  If you use an inhaler, bring it with you to the  test.  Bring a snack to eat after the test.  Do not  smoke for 4 hours before the test.  Do not put lotions, powders, creams, or oils on your chest before the test.  Wear comfortable shoes and clothing. What happens during the procedure?  You will have patches put on your chest. Small areas of your chest may need to be shaved. Wires will be connected to the patches.  Your heart rate will be watched while you are resting and while you are exercising.  You will walk on the treadmill. The treadmill will slowly get faster to raise your heart rate.  The test will take about 1-2 hours. What happens after the procedure?  Your heart rate and blood pressure will be watched after the test.  You may return to your normal diet, activities, and medicines or as told by your doctor. This information is not intended to replace advice given to you by your health care provider. Make sure you discuss any questions you have with your health care provider. Document Released: 05/26/2008 Document Revised: 08/06/2016 Document Reviewed: 08/15/2013 Elsevier Interactive Patient Education  2017 Elsevier Inc.     Echocardiogram An echocardiogram, or echocardiography, uses sound waves (ultrasound) to produce an image of your heart. The echocardiogram is simple, painless, obtained within a short period of time, and offers valuable information to your health care provider. The images from an echocardiogram can provide information such as:  Evidence of coronary artery disease (CAD).  Heart size.  Heart muscle function.  Heart valve function.  Aneurysm detection.  Evidence of a past heart attack.  Fluid buildup around the  heart.  Heart muscle thickening.  Assess heart valve function. Tell a health care provider about:  Any allergies you have.  All medicines you are taking, including vitamins, herbs, eye drops, creams, and over-the-counter medicines.  Any problems you or family members have had  with anesthetic medicines.  Any blood disorders you have.  Any surgeries you have had.  Any medical conditions you have.  Whether you are pregnant or may be pregnant. What happens before the procedure? No special preparation is needed. Eat and drink normally. What happens during the procedure?  In order to produce an image of your heart, gel will be applied to your chest and a wand-like tool (transducer) will be moved over your chest. The gel will help transmit the sound waves from the transducer. The sound waves will harmlessly bounce off your heart to allow the heart images to be captured in real-time motion. These images will then be recorded.  You may need an IV to receive a medicine that improves the quality of the pictures. What happens after the procedure? You may return to your normal schedule including diet, activities, and medicines, unless your health care provider tells you otherwise. This information is not intended to replace advice given to you by your health care provider. Make sure you discuss any questions you have with your health care provider. Document Released: 12/05/2000 Document Revised: 07/26/2016 Document Reviewed: 08/15/2013 Elsevier Interactive Patient Education  2017 [employer].   If you need a refill on your cardiac medications before your next appointment, please call your pharmacy.

## 2016-12-23 ENCOUNTER — Telehealth (HOSPITAL_COMMUNITY): Payer: Self-pay | Admitting: *Deleted

## 2016-12-23 ENCOUNTER — Other Ambulatory Visit: Payer: Self-pay | Admitting: Family Medicine

## 2016-12-23 MED ORDER — METOPROLOL SUCCINATE ER 25 MG PO TB24: 25.0000 mg | ORAL_TABLET | Freq: Every day | ORAL | 3 refills | Status: DC

## 2016-12-23 NOTE — Telephone Encounter (Signed)
Patient given detailed instructions per Myocardial Perfusion Study Information Sheet for the test on 12/26/16 at 0715. Patient notified to arrive 15 minutes early and that it is imperative to arrive on time for appointment to keep from having the test rescheduled.  If you need to cancel or reschedule your appointment, please call the office within 24 hours of your appointment. Failure to do so may result in a cancellation of your appointment, and a $50 no show fee. Patient verbalized understanding.Kwan Shellhammer, Adelene Idler

## 2016-12-23 NOTE — Telephone Encounter (Signed)
Patient needs his metoprolol sent to [id number]. Patient left a message on triage line today 12/23/2016 requesting this refill Sent in refill as patient requested. Last office visit with PCP was on 12/05/2016. Patient informed refill done.

## 2016-12-26 ENCOUNTER — Other Ambulatory Visit: Payer: Self-pay

## 2016-12-26 ENCOUNTER — Ambulatory Visit (HOSPITAL_COMMUNITY): Payer: 59 | Attending: Cardiovascular Disease

## 2016-12-26 ENCOUNTER — Ambulatory Visit (HOSPITAL_BASED_OUTPATIENT_CLINIC_OR_DEPARTMENT_OTHER): Payer: 59

## 2016-12-26 DIAGNOSIS — I493 Ventricular premature depolarization: Secondary | ICD-10-CM

## 2016-12-26 DIAGNOSIS — Z136 Encounter for screening for cardiovascular disorders: Secondary | ICD-10-CM

## 2016-12-26 DIAGNOSIS — I517 Cardiomegaly: Secondary | ICD-10-CM | POA: Insufficient documentation

## 2016-12-26 DIAGNOSIS — Z8249 Family history of ischemic heart disease and other diseases of the circulatory system: Secondary | ICD-10-CM

## 2016-12-26 DIAGNOSIS — I351 Nonrheumatic aortic (valve) insufficiency: Secondary | ICD-10-CM | POA: Diagnosis not present

## 2016-12-26 DIAGNOSIS — I501 Left ventricular failure: Secondary | ICD-10-CM | POA: Diagnosis not present

## 2016-12-26 DIAGNOSIS — R9439 Abnormal result of other cardiovascular function study: Secondary | ICD-10-CM | POA: Diagnosis not present

## 2016-12-26 LAB — MYOCARDIAL PERFUSION IMAGING
CHL CUP NUCLEAR SDS: 1
CHL CUP NUCLEAR SRS: 6
CHL CUP RESTING HR STRESS: 90 {beats}/min
CSEPED: 7 min
CSEPHR: 88 %
CSEPPHR: 139 {beats}/min
Estimated workload: 8.5 METS
Exercise duration (sec): 0 s
LV dias vol: 152 mL (ref 62–150)
LV sys vol: 86 mL
MPHR: 157 {beats}/min
RATE: 0.28
SSS: 7
TID: 1

## 2016-12-26 MED ORDER — TECHNETIUM TC 99M TETROFOSMIN IV KIT
31.9000 | PACK | Freq: Once | INTRAVENOUS | Status: AC | PRN
  Administered 2016-12-26: 31.9 via INTRAVENOUS
  Filled 2016-12-26: qty 32

## 2016-12-26 MED ORDER — TECHNETIUM TC 99M TETROFOSMIN IV KIT
10.2000 | PACK | Freq: Once | INTRAVENOUS | Status: AC | PRN
  Administered 2016-12-26: 10.2 via INTRAVENOUS
  Filled 2016-12-26: qty 11

## 2016-12-30 ENCOUNTER — Encounter: Payer: Self-pay | Admitting: Physician Assistant

## 2016-12-30 ENCOUNTER — Ambulatory Visit
Admission: RE | Admit: 2016-12-30 | Discharge: 2016-12-30 | Disposition: A | Discharge status: Home / Self Care | Payer: 59 | Source: Ambulatory Visit | Attending: Physician Assistant | Admitting: Physician Assistant

## 2016-12-30 ENCOUNTER — Ambulatory Visit (INDEPENDENT_AMBULATORY_CARE_PROVIDER_SITE_OTHER): Payer: 59 | Admitting: Physician Assistant

## 2016-12-30 VITALS — BP 132/86 | HR 90 | Ht 74.0 in | Wt 215.0 lb

## 2016-12-30 DIAGNOSIS — I519 Heart disease, unspecified: Secondary | ICD-10-CM | POA: Diagnosis not present

## 2016-12-30 DIAGNOSIS — Z8249 Family history of ischemic heart disease and other diseases of the circulatory system: Secondary | ICD-10-CM | POA: Diagnosis not present

## 2016-12-30 DIAGNOSIS — E785 Hyperlipidemia, unspecified: Secondary | ICD-10-CM | POA: Diagnosis not present

## 2016-12-30 DIAGNOSIS — I493 Ventricular premature depolarization: Secondary | ICD-10-CM

## 2016-12-30 DIAGNOSIS — R9439 Abnormal result of other cardiovascular function study: Secondary | ICD-10-CM

## 2016-12-30 MED ORDER — LOSARTAN POTASSIUM 25 MG PO TABS: 15.0000 mg | ORAL_TABLET | Freq: Every day | ORAL | 2 refills | Status: DC

## 2016-12-30 NOTE — Progress Notes (Signed)
Cardiology Office Note    Date:  12/30/2016   ID:  Randy Pittman, DOB 24-Jul-1951, MRN [id number]  PCP:  Randy Givens, MD  Cardiologist:   No chief complaint on file.   History of Present Illness:  Randy Pittman is a 66 y.o. male  with no prior cardiac history. Had a Life Line screening and had an irregular heart beat and told to see primary care. He had a lot of PVC's on EKG and referred here.   Identical twin had MI at 57 and now has EF 15% and defibrillator. He was a smoker. Brother died at 52 with congenital heart disease. Uncle died at 85 MI.   Patient is very active. Works nights at [employer]. Does all his own yard work. Denies chest pain, dyspnea, dyspnea on exertion, dizziness or presyncope. Has HLD, no HTN, DM, smoked for 5 yrs.   I discussed the patient with Randy Pittman and we ordered a stress test and 2-D echo. Nuclear stress test EF 43% findings consistent with ischemia. This was an intermediate risk study he had upsloping ST segment depression 1 mm during stress in leads 1 aVL V5 and V6. There was moderate appearing inferior wall ischemia from the apex to the base with diffuse hypokinesis worse in the apex. 2-D echo systolic function was mild to moderately reduced LVEF 40-45% with moderate hypokinesis of the mid apical inferoseptal myocardium. There was also grade 1 diastolic dysfunction trivial AI mildly dilated right atrium.     Past Medical History:  Diagnosis Date  . Hyperlipidemia 07/1995  . Low testosterone    per Randy Pittman, with urology    No past surgical history on file.  Current Medications: Outpatient Medications Prior to Visit  Medication Sig Dispense Refill  . aspirin 81 MG tablet Take 81 mg by mouth daily.      . cholecalciferol (VITAMIN D) 1000 UNITS tablet Take 1,000 Units by mouth daily.    . metoprolol succinate (TOPROL-XL) 25 MG 24 hr tablet Take 1 tablet (25 mg total) by mouth daily. 90 tablet 3  . simvastatin (ZOCOR) 40 MG tablet Take 1 by  mouth at bedtime 90 tablet 3  . vitamin B-12 (CYANOCOBALAMIN) 1000 MCG tablet Take 1,000 mcg by mouth daily.     No facility-administered medications prior to visit.      Allergies:   Patient has no known allergies.   Social History   Social History  . Marital status: Married    Spouse name: N/A  . Number of children: N/A  . Years of education: N/A   Occupational History  . [employer]    Social History Main Topics  . Smoking status: Former Games developer  . Smokeless tobacco: Never Used     Comment: quit in 1980  . Alcohol use Yes     Comment: 4-5 drinks a week  . Drug use: No  . Sexual activity: Not Asked   Other Topics Concern  . 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   ROS:   Please see the history of present illness.    Review of  Systems  Constitution: Negative.  HENT: Negative.   Cardiovascular: Negative.   Respiratory: Negative.   Endocrine: Negative.   Hematologic/Lymphatic: Negative.   Musculoskeletal: Negative.   Gastrointestinal: Negative.   Genitourinary: Negative.   Neurological: Negative.    All other systems reviewed and are negative.   PHYSICAL EXAM:   VS:  BP 132/86   Pulse 90   Ht 6\' 2"  (1.88 m)   Wt 215 lb (97.5 kg)   BMI 27.60 kg/m   Physical Exam  GEN: Well nourished, well developed, in no acute distress  HEENT: normal  Neck: no JVD, carotid bruits, or masses Cardiac:RRR; no murmurs, rubs, or gallops  Respiratory:  clear to auscultation bilaterally, normal work of breathing GI: soft, nontender, nondistended, + BS Ext: without cyanosis, clubbing, or edema, Good distal pulses  bilaterally MS: no deformity or atrophy  Skin: warm and dry, no rash Neuro:  Alert and Oriented x 3, Strength and sensation are intact Psych: euthymic mood, full affect  Wt Readings from Last 3 Encounters:  12/30/16 215 lb (97.5 kg)  12/08/16 223 lb 12.8 oz (101.5 kg)  12/05/16 219 lb (99.3 kg)      Studies/Labs Reviewed:   EKG:  EKG is  Not ordered today.   Recent Labs: 12/05/2016: ALT 20; BUN 16; Creatinine, Ser 0.93; Hemoglobin 15.2; Platelets 159.0; Potassium 4.0; Sodium 142; TSH 1.10   Lipid Panel    Component Value Date/Time   CHOL 151 08/01/2016 0816   TRIG 96.0 08/01/2016 0816   HDL 51.30 08/01/2016 0816   CHOLHDL 3 08/01/2016 0816   VLDL 19.2 08/01/2016 0816   LDLCALC 80 08/01/2016 0816   LDLDIRECT 169.4 07/09/2012 1021    Additional studies/ records that were reviewed today include:  2Decho 12/26/16 Study Conclusions   - Left ventricle: The cavity size was normal. Wall thickness was   increased in a pattern of mild LVH. Systolic function was mildly   to moderately reduced. The estimated ejection fraction was in the   range of 40% to 45%. Moderate hypokinesis of the   mid-apicalinferoseptal myocardium. Doppler parameters are   consistent with abnormal left ventricular relaxation (grade 1   diastolic dysfunction). - Aortic valve: There was trivial regurgitation. - Right atrium: The atrium was mildly dilated.   Study Highlights     Nuclear stress EF: 43%.  Findings consistent with ischemia.  This is an intermediate risk study.  The left ventricular ejection fraction is moderately decreased (30-44%).  Upsloping ST segment depression ST segment depression of 1 mm was noted during stress in the I, aVL, V5 and V6 leads.   Moderate appearing inferior wall ischemia from apex to base Diffuse hypokinesis worse in apex EF 43%         ASSESSMENT:    1. Abnormal nuclear stress test   2. Left ventricular dysfunction   3. Family history of early CAD   4.  PVC's (premature ventricular contractions)   5. Hyperlipidemia, unspecified hyperlipidemia type      PLAN:  In order of problems listed above:  Abnormal nuclear stress test with inferior wall ischemia from the apex to the base diffuse hypokinesis EF 43%. Discussed with RandyNahser who recommends cardiac catheterization. We'll do blood work and chest x-ray today. I have reviewed the risks, indications, and alternatives to angioplasty and stenting with the patient. Risks include but are not limited to bleeding, infection, vascular injury, stroke, myocardial infection, arrhythmia, kidney injury, radiation-related injury in the case of prolonged fluoroscopy use, emergency cardiac  surgery, and death. The patient understands the risks of serious complication is low (<1%) and he agrees to proceed.   LV dysfunction ejection fraction 40-45% we'll begin low-dose Cozaar 12.5 mg daily. Hopefully this will improve with correcting ischemia.   PVCs on low-dose metoprolol. Consider 48 hour monitor if it does not improve after cath.  Family history of CAD with twin brother EF of 15% and defibrillator.  Hyperlipidemia continue Zocor     Medication Adjustments/Labs and Tests Ordered: Current medicines are reviewed at length with the patient today.  Concerns regarding medicines are outlined above.  Medication changes, Labs and Tests ordered today are listed in the Patient Instructions below. Patient Instructions  Medication Instructions:  Your physician has recommended you make the following change in your medication:  START Cozaar (Losartan) 15mg  (1/2 tablet) daily. An Rx has been sent to [facility].  Labwork: Bmet, Cbc, Pt/Inr today  Testing/Procedures: Your physician has requested that you have a cardiac catheterization. Cardiac catheterization is used to diagnose and/or treat various heart conditions. Doctors may recommend this procedure for a number of different reasons. The most common reason is  to evaluate chest pain. Chest pain can be a symptom of coronary artery disease (CAD), and cardiac catheterization can show whether plaque is narrowing or blocking your heart's arteries. This procedure is also used to evaluate the valves, as well as measure the blood flow and oxygen levels in different parts of your heart. For further information please visit [email or web address] Please follow instruction sheet, as given.   A chest x-ray takes a picture of the organs and structures inside the chest, including the heart, lungs, and blood vessels. This test can show several things, including, whether the heart is enlarges; whether fluid is building up in the lungs; and whether pacemaker / defibrillator leads are still in place. ( To be done at [facility]. [address], 1st floor)  Follow-Up: Your physician recommends that you schedule a follow-up appointment on 01/13/17 with Jacolyn Reedy, PA-C   Any Other Special Instructions Will Be Listed Below (If Applicable).     If you need a refill on your cardiac medications before your next appointment, please call your pharmacy.      Randy Clan, PA-C  12/30/2016 2:05 PM    [facility] [address] [place], [place], [place]  [address] Phone: [phone]; Fax: [phone]

## 2016-12-30 NOTE — Patient Instructions (Addendum)
Medication Instructions:  Your physician has recommended you make the following change in your medication:  START Cozaar (Losartan) 15mg  (1/2 tablet) daily. An Rx has been sent to [facility].  Labwork: Bmet, Cbc, Pt/Inr today  Testing/Procedures: Your physician has requested that you have a cardiac catheterization. Cardiac catheterization is used to diagnose and/or treat various heart conditions. Doctors may recommend this procedure for a number of different reasons. The most common reason is to evaluate chest pain. Chest pain can be a symptom of coronary artery disease (CAD), and cardiac catheterization can show whether plaque is narrowing or blocking your heart's arteries. This procedure is also used to evaluate the valves, as well as measure the blood flow and oxygen levels in different parts of your heart. For further information please visit [email or web address] Please follow instruction sheet, as given.   A chest x-ray takes a picture of the organs and structures inside the chest, including the heart, lungs, and blood vessels. This test can show several things, including, whether the heart is enlarges; whether fluid is building up in the lungs; and whether pacemaker / defibrillator leads are still in place. ( To be done at [facility]. [address], 1st floor)  Follow-Up: Your physician recommends that you schedule a follow-up appointment on 01/13/17 with Jacolyn Reedy, PA-C   Any Other Special Instructions Will Be Listed Below (If Applicable).     If you need a refill on your cardiac medications before your next appointment, please call your pharmacy.

## 2016-12-31 LAB — CBC WITH DIFFERENTIAL/PLATELET
BASOS ABS: 0 10*3/uL (ref 0.0–0.2)
BASOS: 1 %
EOS (ABSOLUTE): 0.2 10*3/uL (ref 0.0–0.4)
EOS: 3 %
HEMATOCRIT: 43.5 % (ref 37.5–51.0)
HEMOGLOBIN: 14.3 g/dL (ref 13.0–17.7)
IMMATURE GRANS (ABS): 0 10*3/uL (ref 0.0–0.1)
Immature Granulocytes: 0 %
LYMPHS: 20 %
Lymphocytes Absolute: 1.4 10*3/uL (ref 0.7–3.1)
MCH: 28.6 pg (ref 26.6–33.0)
MCHC: 32.9 g/dL (ref 31.5–35.7)
MCV: 87 fL (ref 79–97)
MONOCYTES: 8 %
Monocytes Absolute: 0.6 10*3/uL (ref 0.1–0.9)
NEUTROS ABS: 4.5 10*3/uL (ref 1.4–7.0)
Neutrophils: 68 %
Platelets: 174 10*3/uL (ref 150–379)
RBC: 5 x10E6/uL (ref 4.14–5.80)
RDW: 13.9 % (ref 12.3–15.4)
WBC: 6.6 10*3/uL (ref 3.4–10.8)

## 2016-12-31 LAB — BASIC METABOLIC PANEL
BUN/Creatinine Ratio: 14 (ref 10–24)
BUN: 14 mg/dL (ref 8–27)
CALCIUM: 9.1 mg/dL (ref 8.6–10.2)
CHLORIDE: 103 mmol/L (ref 96–106)
CO2: 25 mmol/L (ref 18–29)
CREATININE: 0.97 mg/dL (ref 0.76–1.27)
GFR, EST AFRICAN AMERICAN: 96 mL/min/{1.73_m2} (ref >59)
GFR, EST NON AFRICAN AMERICAN: 83 mL/min/{1.73_m2} (ref >59)
Glucose: 93 mg/dL (ref 65–99)
Potassium: 4 mmol/L (ref 3.5–5.2)
Sodium: 146 mmol/L — ABNORMAL HIGH (ref 134–144)

## 2016-12-31 LAB — PROTIME-INR
INR: 1 (ref 0.8–1.2)
Prothrombin Time: 10.7 s (ref 9.1–12.0)

## 2017-01-01 ENCOUNTER — Encounter (HOSPITAL_COMMUNITY): Payer: Self-pay | Admitting: General Practice

## 2017-01-01 ENCOUNTER — Encounter (HOSPITAL_COMMUNITY)
Admission: RE | Disposition: A | Discharge status: Home / Self Care | Payer: Self-pay | Source: Ambulatory Visit | Attending: Cardiovascular Disease

## 2017-01-01 ENCOUNTER — Ambulatory Visit (HOSPITAL_COMMUNITY)
Admission: RE | Admit: 2017-01-01 | Discharge: 2017-01-02 | Disposition: A | Discharge status: Home / Self Care | Payer: 59 | Source: Ambulatory Visit | Attending: Cardiovascular Disease | Admitting: Cardiovascular Disease

## 2017-01-01 DIAGNOSIS — E119 Type 2 diabetes mellitus without complications: Secondary | ICD-10-CM | POA: Insufficient documentation

## 2017-01-01 DIAGNOSIS — Z87891 Personal history of nicotine dependence: Secondary | ICD-10-CM | POA: Insufficient documentation

## 2017-01-01 DIAGNOSIS — I2510 Atherosclerotic heart disease of native coronary artery without angina pectoris: Secondary | ICD-10-CM

## 2017-01-01 DIAGNOSIS — I493 Ventricular premature depolarization: Secondary | ICD-10-CM | POA: Diagnosis not present

## 2017-01-01 DIAGNOSIS — E785 Hyperlipidemia, unspecified: Secondary | ICD-10-CM | POA: Insufficient documentation

## 2017-01-01 DIAGNOSIS — Z8249 Family history of ischemic heart disease and other diseases of the circulatory system: Secondary | ICD-10-CM | POA: Insufficient documentation

## 2017-01-01 DIAGNOSIS — Z7982 Long term (current) use of aspirin: Secondary | ICD-10-CM | POA: Insufficient documentation

## 2017-01-01 DIAGNOSIS — I2582 Chronic total occlusion of coronary artery: Secondary | ICD-10-CM | POA: Diagnosis not present

## 2017-01-01 DIAGNOSIS — Z833 Family history of diabetes mellitus: Secondary | ICD-10-CM | POA: Diagnosis not present

## 2017-01-01 DIAGNOSIS — Z8042 Family history of malignant neoplasm of prostate: Secondary | ICD-10-CM | POA: Insufficient documentation

## 2017-01-01 DIAGNOSIS — Z8371 Family history of colonic polyps: Secondary | ICD-10-CM | POA: Diagnosis not present

## 2017-01-01 DIAGNOSIS — Z955 Presence of coronary angioplasty implant and graft: Secondary | ICD-10-CM

## 2017-01-01 DIAGNOSIS — R9439 Abnormal result of other cardiovascular function study: Secondary | ICD-10-CM | POA: Diagnosis present

## 2017-01-01 HISTORY — PX: CORONARY ANGIOPLASTY WITH STENT PLACEMENT: SHX49

## 2017-01-01 HISTORY — PX: CARDIAC CATHETERIZATION: SHX172

## 2017-01-01 LAB — POCT ACTIVATED CLOTTING TIME: Activated Clotting Time: 621 seconds

## 2017-01-01 LAB — CARDIAC CATHETERIZATION: Cath EF Quantitative: 50 %

## 2017-01-01 SURGERY — LEFT HEART CATH AND CORONARY ANGIOGRAPHY
Anesthesia: LOCAL

## 2017-01-01 MED ORDER — IOPAMIDOL (ISOVUE-370) INJECTION 76%
INTRAVENOUS | Status: AC
  Filled 2017-01-01: qty 100

## 2017-01-01 MED ORDER — ANGIOPLASTY BOOK
Freq: Once | Status: AC
  Administered 2017-01-01: 18:00:00
  Filled 2017-01-01: qty 1

## 2017-01-01 MED ORDER — SODIUM CHLORIDE 0.9 % IV SOLN
INTRAVENOUS | Status: DC
  Administered 2017-01-01: 150 mL/h via INTRAVENOUS
  Administered 2017-01-01: 17:00:00 via INTRAVENOUS

## 2017-01-01 MED ORDER — NITROGLYCERIN 1 MG/10 ML FOR IR/CATH LAB
INTRA_ARTERIAL | Status: AC
  Filled 2017-01-01: qty 10

## 2017-01-01 MED ORDER — SODIUM CHLORIDE 0.9 % IV SOLN
INTRAVENOUS | Status: DC | PRN
  Administered 2017-01-01 (×2): 1.75 mg/kg/h via INTRAVENOUS

## 2017-01-01 MED ORDER — ASPIRIN 81 MG PO CHEW
CHEWABLE_TABLET | ORAL | Status: AC
  Filled 2017-01-01: qty 1

## 2017-01-01 MED ORDER — VERAPAMIL HCL 2.5 MG/ML IV SOLN
INTRAVENOUS | Status: AC
  Filled 2017-01-01: qty 2

## 2017-01-01 MED ORDER — TICAGRELOR 90 MG PO TABS
90.0000 mg | ORAL_TABLET | Freq: Two times a day (BID) | ORAL | Status: DC
  Administered 2017-01-01 – 2017-01-02 (×2): 90 mg via ORAL
  Filled 2017-01-01 (×2): qty 1

## 2017-01-01 MED ORDER — SODIUM CHLORIDE 0.9 % IV SOLN: 250.0000 mL | INTRAVENOUS | Status: DC | PRN

## 2017-01-01 MED ORDER — BIVALIRUDIN 250 MG IV SOLR
INTRAVENOUS | Status: AC
  Filled 2017-01-01: qty 250

## 2017-01-01 MED ORDER — HEPARIN SODIUM (PORCINE) 1000 UNIT/ML IJ SOLN
INTRAMUSCULAR | Status: DC | PRN
  Administered 2017-01-01: 5000 [IU] via INTRAVENOUS

## 2017-01-01 MED ORDER — HYDRALAZINE HCL 20 MG/ML IJ SOLN: 5.0000 mg | INTRAMUSCULAR | Status: AC | PRN

## 2017-01-01 MED ORDER — MIDAZOLAM HCL 2 MG/2ML IJ SOLN
INTRAMUSCULAR | Status: DC | PRN
  Administered 2017-01-01: 1 mg via INTRAVENOUS
  Administered 2017-01-01: 2 mg via INTRAVENOUS

## 2017-01-01 MED ORDER — HEPARIN (PORCINE) IN NACL 2-0.9 UNIT/ML-% IJ SOLN
INTRAMUSCULAR | Status: AC
  Filled 2017-01-01: qty 1500

## 2017-01-01 MED ORDER — NITROGLYCERIN 1 MG/10 ML FOR IR/CATH LAB
INTRA_ARTERIAL | Status: DC | PRN
  Administered 2017-01-01: 100 ug via INTRACORONARY
  Administered 2017-01-01: 200 ug via INTRACORONARY
  Administered 2017-01-01: 125 ug via INTRACORONARY

## 2017-01-01 MED ORDER — SODIUM CHLORIDE 0.9 % WEIGHT BASED INFUSION
3.0000 mL/kg/h | INTRAVENOUS | Status: DC
  Administered 2017-01-01: 3 mL/kg/h via INTRAVENOUS

## 2017-01-01 MED ORDER — TICAGRELOR 90 MG PO TABS
ORAL_TABLET | ORAL | Status: DC | PRN
  Administered 2017-01-01: 180 mg via ORAL

## 2017-01-01 MED ORDER — SODIUM CHLORIDE 0.9% FLUSH: 3.0000 mL | Freq: Two times a day (BID) | INTRAVENOUS | Status: DC

## 2017-01-01 MED ORDER — ATORVASTATIN CALCIUM 80 MG PO TABS
80.0000 mg | ORAL_TABLET | Freq: Every day | ORAL | Status: DC
  Administered 2017-01-01: 18:00:00 80 mg via ORAL
  Filled 2017-01-01: qty 1

## 2017-01-01 MED ORDER — BIVALIRUDIN BOLUS VIA INFUSION - CUPID
INTRAVENOUS | Status: DC | PRN
  Administered 2017-01-01: 73.125 mg via INTRAVENOUS

## 2017-01-01 MED ORDER — LIDOCAINE HCL (PF) 1 % IJ SOLN
INTRAMUSCULAR | Status: AC
  Filled 2017-01-01: qty 30

## 2017-01-01 MED ORDER — HEPARIN SODIUM (PORCINE) 1000 UNIT/ML IJ SOLN
INTRAMUSCULAR | Status: AC
  Filled 2017-01-01: qty 1

## 2017-01-01 MED ORDER — VERAPAMIL HCL 2.5 MG/ML IV SOLN
INTRAVENOUS | Status: DC | PRN
  Administered 2017-01-01: 10 mL via INTRA_ARTERIAL

## 2017-01-01 MED ORDER — IOPAMIDOL (ISOVUE-370) INJECTION 76%
INTRAVENOUS | Status: AC
  Filled 2017-01-01: qty 50

## 2017-01-01 MED ORDER — SODIUM CHLORIDE 0.9% FLUSH: 3.0000 mL | INTRAVENOUS | Status: DC | PRN

## 2017-01-01 MED ORDER — ASPIRIN 81 MG PO CHEW
81.0000 mg | CHEWABLE_TABLET | ORAL | Status: AC
  Administered 2017-01-01: 81 mg via ORAL

## 2017-01-01 MED ORDER — ACETAMINOPHEN 325 MG PO TABS: 650.0000 mg | ORAL_TABLET | ORAL | Status: DC | PRN

## 2017-01-01 MED ORDER — SODIUM CHLORIDE 0.9 % WEIGHT BASED INFUSION: 1.0000 mL/kg/h | INTRAVENOUS | Status: DC

## 2017-01-01 MED ORDER — LIDOCAINE HCL (PF) 1 % IJ SOLN
INTRAMUSCULAR | Status: DC | PRN
  Administered 2017-01-01: 2 mL via INTRADERMAL

## 2017-01-01 MED ORDER — MIDAZOLAM HCL 2 MG/2ML IJ SOLN
INTRAMUSCULAR | Status: AC
  Filled 2017-01-01: qty 2

## 2017-01-01 MED ORDER — FENTANYL CITRATE (PF) 100 MCG/2ML IJ SOLN
INTRAMUSCULAR | Status: AC
  Filled 2017-01-01: qty 2

## 2017-01-01 MED ORDER — TICAGRELOR 90 MG PO TABS
ORAL_TABLET | ORAL | Status: AC
  Filled 2017-01-01: qty 2

## 2017-01-01 MED ORDER — HEPARIN (PORCINE) IN NACL 2-0.9 UNIT/ML-% IJ SOLN
INTRAMUSCULAR | Status: DC | PRN
  Administered 2017-01-01: 1000 mL

## 2017-01-01 MED ORDER — FENTANYL CITRATE (PF) 100 MCG/2ML IJ SOLN
INTRAMUSCULAR | Status: DC | PRN
  Administered 2017-01-01: 25 ug via INTRAVENOUS
  Administered 2017-01-01: 50 ug via INTRAVENOUS

## 2017-01-01 MED ORDER — SODIUM CHLORIDE 0.9% FLUSH
3.0000 mL | Freq: Two times a day (BID) | INTRAVENOUS | Status: DC
  Administered 2017-01-01: 21:00:00 3 mL via INTRAVENOUS

## 2017-01-01 MED ORDER — ASPIRIN 81 MG PO CHEW
81.0000 mg | CHEWABLE_TABLET | Freq: Every day | ORAL | Status: DC
  Administered 2017-01-02: 81 mg via ORAL
  Filled 2017-01-01: qty 1

## 2017-01-01 MED ORDER — DIAZEPAM 5 MG PO TABS: 5.0000 mg | ORAL_TABLET | ORAL | Status: DC | PRN

## 2017-01-01 MED ORDER — ONDANSETRON HCL 4 MG/2ML IJ SOLN: 4.0000 mg | Freq: Four times a day (QID) | INTRAMUSCULAR | Status: DC | PRN

## 2017-01-01 MED ORDER — IOPAMIDOL (ISOVUE-370) INJECTION 76%
INTRAVENOUS | Status: DC | PRN
  Administered 2017-01-01: 270 mL via INTRA_ARTERIAL

## 2017-01-01 MED ORDER — LABETALOL HCL 5 MG/ML IV SOLN: 10.0000 mg | INTRAVENOUS | Status: AC | PRN

## 2017-01-01 SURGICAL SUPPLY — 21 items
BALLN MOZEC 2.0X12 (BALLOONS) ×2
BALLN ~~LOC~~ MOZEC 2.75X15 (BALLOONS) ×2
BALLOON MOZEC 2.0X12 (BALLOONS) ×1 IMPLANT
BALLOON ~~LOC~~ MOZEC 2.75X15 (BALLOONS) ×1 IMPLANT
CATH EXPO 5FR ANG PIGTAIL 145 (CATHETERS) ×2 IMPLANT
CATH INFINITI JR4 5F (CATHETERS) ×2 IMPLANT
CATH OPTITORQUE TIG 4.0 5F (CATHETERS) ×2 IMPLANT
CATH VISTA GUIDE 6FR XB3.5 (CATHETERS) ×2 IMPLANT
DEVICE RAD COMP TR BAND LRG (VASCULAR PRODUCTS) ×2 IMPLANT
GLIDESHEATH SLEND SS 6F .021 (SHEATH) ×2 IMPLANT
GUIDEWIRE INQWIRE 1.5J.035X260 (WIRE) ×1 IMPLANT
INQWIRE 1.5J .035X260CM (WIRE) ×2
KIT ENCORE 26 ADVANTAGE (KITS) ×2 IMPLANT
KIT HEART LEFT (KITS) ×2 IMPLANT
PACK CARDIAC CATHETERIZATION (CUSTOM PROCEDURE TRAY) ×2 IMPLANT
STENT SYNERGY DES 2.5X28 (Permanent Stent) ×2 IMPLANT
SYR MEDRAD MARK V 150ML (SYRINGE) ×2 IMPLANT
TRANSDUCER W/STOPCOCK (MISCELLANEOUS) ×2 IMPLANT
TUBING CIL FLEX 10 FLL-RA (TUBING) ×2 IMPLANT
WIRE COUGAR XT STRL 190CM (WIRE) ×2 IMPLANT
WIRE PT2 MS 185 (WIRE) ×2 IMPLANT

## 2017-01-01 NOTE — Progress Notes (Addendum)
TR BAND REMOVAL  LOCATION:    right radial  DEFLATED PER PROTOCOL:    Yes.    TIME BAND OFF / DRESSING APPLIED:   1650   SITE UPON ARRIVAL:    Level 0  SITE AFTER BAND REMOVAL:    Level 0  CIRCULATION SENSATION AND MOVEMENT:    Within Normal Limits   Yes.    COMMENTS:   Radial cath instructions reviewed with patient and wife,  Wife had many appropriate questions and v/u to answers.  Updated daughter on phone per patient's request.  Call light in reach. Stood at bedside to put pj bottoms on without difficulty. Reminded pt to call for assist when getting OOB for safety and not to use right wrist.

## 2017-01-01 NOTE — H&P (View-Only) (Signed)
Cardiology Office Note    Date:  12/30/2016   ID:  [name], DOB 24-Jul-1951, MRN [id number]  PCP:  Crawford Givens, MD  Cardiologist:   No chief complaint on file.   History of Present Illness:  Randy Pittman is a 66 y.o. male  with no prior cardiac history. Had a Life Line screening and had an irregular heart beat and told to see primary care. He had a lot of PVC's on EKG and referred here.   Identical twin had MI at 57 and now has EF 15% and defibrillator. He was a smoker. Brother died at 52 with congenital heart disease. Uncle died at 85 MI.   Patient is very active. Works nights at [employer]. Does all his own yard work. Denies chest pain, dyspnea, dyspnea on exertion, dizziness or presyncope. Has HLD, no HTN, DM, smoked for 5 yrs.   I discussed the patient with Dr. Excell Seltzer and we ordered a stress test and 2-D echo. Nuclear stress test EF 43% findings consistent with ischemia. This was an intermediate risk study he had upsloping ST segment depression 1 mm during stress in leads 1 aVL V5 and V6. There was moderate appearing inferior wall ischemia from the apex to the base with diffuse hypokinesis worse in the apex. 2-D echo systolic function was mild to moderately reduced LVEF 40-45% with moderate hypokinesis of the mid apical inferoseptal myocardium. There was also grade 1 diastolic dysfunction trivial AI mildly dilated right atrium.     Past Medical History:  Diagnosis Date  . Hyperlipidemia 07/1995  . Low testosterone    per Dr. Reuel Boom, with urology    No past surgical history on file.  Current Medications: Outpatient Medications Prior to Visit  Medication Sig Dispense Refill  . aspirin 81 MG tablet Take 81 mg by mouth daily.      . cholecalciferol (VITAMIN D) 1000 UNITS tablet Take 1,000 Units by mouth daily.    . metoprolol succinate (TOPROL-XL) 25 MG 24 hr tablet Take 1 tablet (25 mg total) by mouth daily. 90 tablet 3  . simvastatin (ZOCOR) 40 MG tablet Take 1 by  mouth at bedtime 90 tablet 3  . vitamin B-12 (CYANOCOBALAMIN) 1000 MCG tablet Take 1,000 mcg by mouth daily.     No facility-administered medications prior to visit.      Allergies:   Patient has no known allergies.   Social History   Social History  . Marital status: Married    Spouse name: N/A  . Number of children: N/A  . Years of education: N/A   Occupational History  . [employer] tech [employer]    Social History Main Topics  . Smoking status: Former Games developer  . Smokeless tobacco: Never Used     Comment: quit in 1980  . Alcohol use Yes     Comment: 4-5 drinks a week  . Drug use: No  . Sexual activity: Not Asked   Other Topics Concern  . 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   ROS:   Please see the history of present illness.    Review of  Systems  Constitution: Negative.  HENT: Negative.   Cardiovascular: Negative.   Respiratory: Negative.   Endocrine: Negative.   Hematologic/Lymphatic: Negative.   Musculoskeletal: Negative.   Gastrointestinal: Negative.   Genitourinary: Negative.   Neurological: Negative.    All other systems reviewed and are negative.   PHYSICAL EXAM:   VS:  BP 132/86   Pulse 90   Ht 6\' 2"  (1.88 m)   Wt 215 lb (97.5 kg)   BMI 27.60 kg/m   Physical Exam  GEN: Well nourished, well developed, in no acute distress  HEENT: normal  Neck: no JVD, carotid bruits, or masses Cardiac:RRR; no murmurs, rubs, or gallops  Respiratory:  clear to auscultation bilaterally, normal work of breathing GI: soft, nontender, nondistended, + BS Ext: without cyanosis, clubbing, or edema, Good distal pulses  bilaterally MS: no deformity or atrophy  Skin: warm and dry, no rash Neuro:  Alert and Oriented x 3, Strength and sensation are intact Psych: euthymic mood, full affect  Wt Readings from Last 3 Encounters:  12/30/16 215 lb (97.5 kg)  12/08/16 223 lb 12.8 oz (101.5 kg)  12/05/16 219 lb (99.3 kg)      Studies/Labs Reviewed:   EKG:  EKG is  Not ordered today.   Recent Labs: 12/05/2016: ALT 20; BUN 16; Creatinine, Ser 0.93; Hemoglobin 15.2; Platelets 159.0; Potassium 4.0; Sodium 142; TSH 1.10   Lipid Panel    Component Value Date/Time   CHOL 151 08/01/2016 0816   TRIG 96.0 08/01/2016 0816   HDL 51.30 08/01/2016 0816   CHOLHDL 3 08/01/2016 0816   VLDL 19.2 08/01/2016 0816   LDLCALC 80 08/01/2016 0816   LDLDIRECT 169.4 07/09/2012 1021    Additional studies/ records that were reviewed today include:  2Decho 12/26/16 Study Conclusions   - Left ventricle: The cavity size was normal. Wall thickness was   increased in a pattern of mild LVH. Systolic function was mildly   to moderately reduced. The estimated ejection fraction was in the   range of 40% to 45%. Moderate hypokinesis of the   mid-apicalinferoseptal myocardium. Doppler parameters are   consistent with abnormal left ventricular relaxation (grade 1   diastolic dysfunction). - Aortic valve: There was trivial regurgitation. - Right atrium: The atrium was mildly dilated.   Study Highlights     Nuclear stress EF: 43%.  Findings consistent with ischemia.  This is an intermediate risk study.  The left ventricular ejection fraction is moderately decreased (30-44%).  Upsloping ST segment depression ST segment depression of 1 mm was noted during stress in the I, aVL, V5 and V6 leads.   Moderate appearing inferior wall ischemia from apex to base Diffuse hypokinesis worse in apex EF 43%         ASSESSMENT:    1. Abnormal nuclear stress test   2. Left ventricular dysfunction   3. Family history of early CAD   4.  PVC's (premature ventricular contractions)   5. Hyperlipidemia, unspecified hyperlipidemia type      PLAN:  In order of problems listed above:  Abnormal nuclear stress test with inferior wall ischemia from the apex to the base diffuse hypokinesis EF 43%. Discussed with Dr.Nahser who recommends cardiac catheterization. We'll do blood work and chest x-ray today. I have reviewed the risks, indications, and alternatives to angioplasty and stenting with the patient. Risks include but are not limited to bleeding, infection, vascular injury, stroke, myocardial infection, arrhythmia, kidney injury, radiation-related injury in the case of prolonged fluoroscopy use, emergency cardiac  surgery, and death. The patient understands the risks of serious complication is low (<1%) and he agrees to proceed.   LV dysfunction ejection fraction 40-45% we'll begin low-dose Cozaar 12.5 mg daily. Hopefully this will improve with correcting ischemia.   PVCs on low-dose metoprolol. Consider 48 hour monitor if it does not improve after cath.  Family history of CAD with twin brother EF of 15% and defibrillator.  Hyperlipidemia continue Zocor     Medication Adjustments/Labs and Tests Ordered: Current medicines are reviewed at length with the patient today.  Concerns regarding medicines are outlined above.  Medication changes, Labs and Tests ordered today are listed in the Patient Instructions below. Patient Instructions  Medication Instructions:  Your physician has recommended you make the following change in your medication:  START Cozaar (Losartan) 15mg  (1/2 tablet) daily. An Rx has been sent to [facility].  Labwork: Bmet, Cbc, Pt/Inr today  Testing/Procedures: Your physician has requested that you have a cardiac catheterization. Cardiac catheterization is used to diagnose and/or treat various heart conditions. Doctors may recommend this procedure for a number of different reasons. The most common reason is  to evaluate chest pain. Chest pain can be a symptom of coronary artery disease (CAD), and cardiac catheterization can show whether plaque is narrowing or blocking your heart's arteries. This procedure is also used to evaluate the valves, as well as measure the blood flow and oxygen levels in different parts of your heart. For further information please visit [email or web address] Please follow instruction sheet, as given.   A chest x-ray takes a picture of the organs and structures inside the chest, including the heart, lungs, and blood vessels. This test can show several things, including, whether the heart is enlarges; whether fluid is building up in the lungs; and whether pacemaker / defibrillator leads are still in place. ( To be done at [facility]. [address], 1st [address])  Follow-Up: Your physician recommends that you schedule a follow-up appointment on 01/13/17 with Jacolyn Reedy, PA-C   Any Other Special Instructions Will Be Listed Below (If Applicable).     If you need a refill on your cardiac medications before your next appointment, please call your pharmacy.      Elson Clan, PA-C  12/30/2016 2:05 PM    [facility] [address] [place], [place], [place]  [address] Phone: [phone]; Fax: [phone]

## 2017-01-01 NOTE — Interval H&P Note (Signed)
Cath Lab Visit (complete for each Cath Lab visit)  Clinical Evaluation Leading to the Procedure:   ACS: No.  Non-ACS:    Anginal Classification: CCS I  Anti-ischemic medical therapy: Minimal Therapy (1 class of medications)  Non-Invasive Test Results: Intermediate-risk stress test findings: cardiac mortality 1-3%/year  Prior CABG: No previous CABG      History and Physical Interval Note:  01/01/2017 8:48 AM  Randy Pittman  has presented today for surgery, with the diagnosis of abnormal myoview  The various methods of treatment have been discussed with the patient and family. After consideration of risks, benefits and other options for treatment, the patient has consented to  Procedure(s): Left Heart Cath and Coronary Angiography (N/A) as a surgical intervention .  The patient's history has been reviewed, patient examined, no change in status, stable for surgery.  I have reviewed the patient's chart and labs.  Questions were answered to the patient's satisfaction.     Nicki Guadalajara

## 2017-01-01 NOTE — Care Management Note (Signed)
Case Management Note  Patient Details  Name: HAKOP KIRT MRN: [id number] Date of Birth: 06-02-1951  Subjective/Objective:  S/p coronary stent intervention, NCM awaitng benefit check for brilinta.                  Action/Plan:   Expected Discharge Date:                  Expected Discharge Plan:  Home/Self Care  In-House Referral:     Discharge planning Services  CM Consult  Post Acute Care Choice:    Choice offered to:     DME Arranged:    DME Agency:     HH Arranged:    HH Agency:     Status of Service:  In process, will continue to follow  If discussed at Long Length of Stay Meetings, dates discussed:    Additional Comments:  Leone Haven, RN 01/01/2017, 12:45 PM

## 2017-01-02 ENCOUNTER — Encounter (HOSPITAL_COMMUNITY): Payer: Self-pay | Admitting: Cardiovascular Disease

## 2017-01-02 DIAGNOSIS — Z8249 Family history of ischemic heart disease and other diseases of the circulatory system: Secondary | ICD-10-CM | POA: Diagnosis not present

## 2017-01-02 DIAGNOSIS — I2582 Chronic total occlusion of coronary artery: Secondary | ICD-10-CM | POA: Diagnosis not present

## 2017-01-02 DIAGNOSIS — R9439 Abnormal result of other cardiovascular function study: Secondary | ICD-10-CM | POA: Diagnosis not present

## 2017-01-02 DIAGNOSIS — I493 Ventricular premature depolarization: Secondary | ICD-10-CM | POA: Diagnosis not present

## 2017-01-02 DIAGNOSIS — I2510 Atherosclerotic heart disease of native coronary artery without angina pectoris: Secondary | ICD-10-CM | POA: Diagnosis not present

## 2017-01-02 LAB — BASIC METABOLIC PANEL
Anion gap: 6 (ref 5–15)
BUN: 13 mg/dL (ref 6–20)
CO2: 27 mmol/L (ref 22–32)
Calcium: 8.4 mg/dL — ABNORMAL LOW (ref 8.9–10.3)
Chloride: 109 mmol/L (ref 101–111)
Creatinine, Ser: 1 mg/dL (ref 0.61–1.24)
GFR calc Af Amer: >60 mL/min (ref >60)
Glucose, Bld: 93 mg/dL (ref 65–99)
POTASSIUM: 3.9 mmol/L (ref 3.5–5.1)
Sodium: 142 mmol/L (ref 135–145)

## 2017-01-02 LAB — CBC
HEMATOCRIT: 40.6 % (ref 39.0–52.0)
HEMOGLOBIN: 13.5 g/dL (ref 13.0–17.0)
MCH: 29.3 pg (ref 26.0–34.0)
MCHC: 33.3 g/dL (ref 30.0–36.0)
MCV: 88.3 fL (ref 78.0–100.0)
Platelets: 151 10*3/uL (ref 150–400)
RBC: 4.6 MIL/uL (ref 4.22–5.81)
RDW: 13.5 % (ref 11.5–15.5)
WBC: 8 10*3/uL (ref 4.0–10.5)

## 2017-01-02 MED ORDER — NITROGLYCERIN 0.4 MG SL SUBL: 0.4000 mg | SUBLINGUAL_TABLET | SUBLINGUAL | Status: DC | PRN

## 2017-01-02 MED ORDER — ATORVASTATIN CALCIUM 80 MG PO TABS: 80.0000 mg | ORAL_TABLET | Freq: Every day | ORAL | 5 refills | Status: DC

## 2017-01-02 MED ORDER — NITROGLYCERIN 0.4 MG SL SUBL: 0.4000 mg | SUBLINGUAL_TABLET | SUBLINGUAL | 2 refills | Status: DC | PRN

## 2017-01-02 MED ORDER — TICAGRELOR 90 MG PO TABS: 90.0000 mg | ORAL_TABLET | Freq: Two times a day (BID) | ORAL | 0 refills | Status: DC

## 2017-01-02 MED ORDER — TICAGRELOR 90 MG PO TABS: 90.0000 mg | ORAL_TABLET | Freq: Two times a day (BID) | ORAL | 3 refills | Status: DC

## 2017-01-02 NOTE — Progress Notes (Signed)
Patient Name: Randy Pittman Date of Encounter: 01/02/2017  Primary Cardiologist: Dr. Rachelle Hora Problem List     Active Problems:   Abnormal nuclear stress test   CAD in native artery    Patient Profile     66 y/o male with family h/o CAD, admitted for elective LHC in the setting of abnormal stress test and frequent PVCs. Day 1 s/p PCI to the OM1.   Subjective   Doing well this morning. He denies any chest pain. No dyspnea. Ambulating w/o difficulty.   Inpatient Medications    Scheduled Meds: . aspirin  81 mg Oral Daily  . atorvastatin  80 mg Oral q1800  . sodium chloride flush  3 mL Intravenous Q12H  . ticagrelor  90 mg Oral BID   Continuous Infusions: . sodium chloride 125 mL/hr at 01/01/17 1709   PRN Meds: sodium chloride, acetaminophen, diazepam, ondansetron (ZOFRAN) IV, sodium chloride flush   Vital Signs    Vitals:   01/01/17 1300 01/01/17 1519 01/01/17 1906 01/02/17 0404  BP:  109/70 104/76 131/85  Pulse:  67 69 72  Resp:   18 13  Temp: 98.2 F (36.8 C) 97.4 F (36.3 C) 97.8 F (36.6 C) 98 F (36.7 C)  TempSrc:  Oral Oral Axillary  SpO2: 98% 97% 95% 96%  Weight:    230 lb 9.6 oz (104.6 kg)  Height:        Intake/Output Summary (Last 24 hours) at 01/02/17 0750 Last data filed at 01/01/17 2110  Gross per 24 hour  Intake          1746.25 ml  Output             2125 ml  Net          -378.75 ml   Filed Weights   01/01/17 0718 01/02/17 0404  Weight: 215 lb (97.5 kg) 230 lb 9.6 oz (104.6 kg)    Physical Exam   GEN: Well nourished, well developed, in no acute distress.  HEENT: Grossly normal.  Neck: Supple, no JVD, carotid bruits, or masses. Cardiac: RRR, no murmurs, rubs, or gallops. No clubbing, cyanosis, edema.  Radials/DP/PT 2+ and equal bilaterally.  Respiratory:  Respirations regular and unlabored, clear to auscultation bilaterally. GI: Soft, nontender, nondistended, BS + x 4. MS: no deformity or atrophy. Skin: warm and dry, no  rash. Neuro:  Strength and sensation are intact. Psych: AAOx3.  Normal affect.  Labs    CBC  Recent Labs  12/30/16 1419 01/02/17 0227  WBC 6.6 8.0  NEUTROABS 4.5  --   HGB  --  13.5  HCT 43.5 40.6  MCV 87 88.3  PLT 174 151   Basic Metabolic Panel  Recent Labs  12/30/16 1419 01/02/17 0227  NA 146* 142  K 4.0 3.9  CL 103 109  CO2 25 27  GLUCOSE 93 93  BUN 14 13  CREATININE 0.97 1.00  CALCIUM 9.1 8.4*   Liver Function Tests No results for input(s): AST, ALT, ALKPHOS, BILITOT, PROT, ALBUMIN in the last 72 hours. No results for input(s): LIPASE, AMYLASE in the last 72 hours. Cardiac Enzymes No results for input(s): CKTOTAL, CKMB, CKMBINDEX, TROPONINI in the last 72 hours. BNP Invalid input(s): POCBNP D-Dimer No results for input(s): DDIMER in the last 72 hours. Hemoglobin A1C No results for input(s): HGBA1C in the last 72 hours. Fasting Lipid Panel No results for input(s): CHOL, HDL, LDLCALC, TRIG, CHOLHDL, LDLDIRECT in the last 72 hours. Thyroid Function  Tests No results for input(s): TSH, T4TOTAL, T3FREE, THYROIDAB in the last 72 hours.  Invalid input(s): FREET3  Telemetry    NSR with PVCs   Radiology    No results found.  Cardiac Studies   Procedures   Coronary Stent Intervention  Left Heart Cath and Coronary Angiography  Conclusion     There is mild left ventricular systolic dysfunction.  Dist RCA lesion, 100 %stenosed.  Mid RCA lesion, 65 %stenosed.  A STENT SYNERGY DES 2.5X28 drug eluting stent was successfully placed.  Lat 1st Mrg lesion, 99 %stenosed.  Post intervention, there is a 0% residual stenosis.   Low-normal to mild LV dysfunction with an ejection fraction of 50% with a small focal region of distal inferior hypocontractility.  Significant 2 vessel coronary obstructive disease with a subtotal 99 100% stenosis involving the inferior branch of the obtuse marginal 1 vessel; and 65% mid RCA stenosis proximal to the acute  margin with total occlusion of the PLA with retrograde collateralization to the distal PLA via the AV groove circumflex branches.  LAD with mild luminal irregularity.  Successful PCI to the circumflex marginal vessel with ultimate insertion of a Synergy DES 2.528 mm stent postdilated 2.75 mm with the 99% stenosis reduced to 0% and resumption of brisk TIMI-3 flow     Patient Profile     66 y/o male with family h/o CAD, admitted for elective LHC in the setting of abnormal stress test and frequent PVCs.   Assessment & Plan    1. CAD: LHC 01/01/17 showed significant 2 vessel coronary obstructive disease with a subtotal 99 100% stenosis involving the inferior branch of the obtuse marginal 1 vessel; and 65% mid RCA stenosis proximal to the acute margin with total occlusion of the PLA with retrograde collateralization to the distal PLA via the AV groove circumflex branches.  LAD with mild luminal irregularity. EF 50%. Successful PCI to the circumflex marginal vessel with ultimate insertion of a Synergy DES 2.528 mm stent postdilated 2.75 mm with the 99% stenosis reduced to 0% and resumption of brisk TIMI-3 flow. Plan is for medical therapy for his RCA. If his mid RCA lesion progresses future stenting may be necessary.  He is CP free and ambulating w/o difficulty. No post cath complications. Radial cath site is stable. Continue DAPT with ASA + Brilinta for a minimum of 1 year  + high intensity statin therapy with Lipitor. Resume home metoprolol.   2. HLD: Lipid panel 08/01/16 showed LDL of 80 mg/dL. Goal given CAD is < 70 mg/dL. His statin was changed yesterday from simvastatin to Lipitor, 80 mg. Recheck FLP and HFTs in 6 weeks.   Signed, Robbie Lis, PA-C  01/02/2017, 7:50 AM   History and all data above reviewed.  Patient examined.  I agree with the findings as above.  The patient exam reveals COR:RRR  ,  Lungs: Clear  ,  Abd: Positive bowel sounds, no rebound no guarding, Ext No edema, right  wrist OK  .  All available labs, radiology testing, previous records reviewed. Agree with documented assessment and plan. CAD:  Ready to go home.  Discussed eating healthy.  Meds as listed.  He has follow up scheduled.    Randy Pittman  9:14 AM  01/02/2017

## 2017-01-02 NOTE — Discharge Summary (Signed)
Discharge Summary    Patient ID: Randy Pittman,  MRN: [id number], DOB/AGE: Sep 14, 1951 66 y.o.  Admit date: 01/01/2017 Discharge date: 01/02/2017  Primary Care Provider: Crawford Givens Primary Cardiologist: Dr. Delton See   Discharge Diagnoses    Active Problems:   Abnormal nuclear stress test   CAD in native artery   Allergies No Known Allergies  Diagnostic Studies/Procedures    Procedures   Coronary Stent Intervention  Left Heart Cath and Coronary Angiography  Conclusion     There is mild left ventricular systolic dysfunction.  Dist RCA lesion, 100 %stenosed.  Mid RCA lesion, 65 %stenosed.  A STENT SYNERGY DES 2.5X28 drug eluting stent was successfully placed.  Lat 1st Mrg lesion, 99 %stenosed.  Post intervention, there is a 0% residual stenosis.  Low-normal to mild LV dysfunction with an ejection fraction of 50% with a small focal region of distal inferior hypocontractility.  Significant 2 vessel coronary obstructive disease with a subtotal 99 100% stenosis involving the inferior branch of the obtuse marginal 1 vessel; and 65% mid RCA stenosis proximal to the acute margin with total occlusion of the PLA with retrograde collateralization to the distal PLA via the AV groove circumflex branches. LAD with mild luminal irregularity.  Successful PCI to the circumflex marginal vessel with ultimate insertion of a Synergy DES 2.528 mm stent postdilated 2.75 mm with the 99% stenosis reduced to 0% and resumption of brisk TIMI-3 flow       History of Present Illness     66 y/o male with family h/o CAD, admitted for elective LHC in the setting of abnormal stress test and frequent PVCs.  Pt had a Life Line screening and had an irregular heart beat and told to see primary care. He had a lot of PVC's on EKG and referred to our practice. He subsequently underwent a NST on 12/27/15 that showed Moderate appearing inferior wall ischemia from apex to base and diffuse  hypokinesis worse in apex EF 43%. He was referred for [facility].    Hospital Course     Pt [facility]17 for the planned procedure. LHC was performed by Dr. Tresa Endo. He was found to have significant 2 vessel coronary obstructive disease with a subtotal 99 100% stenosis involving the inferior branch of the obtuse marginal 1 vessel; and 65% mid RCA stenosis proximal to the acute margin with total occlusion of the PLA with retrograde collateralization to the distal PLA via the AV groove circumflex branches. LAD with mild luminal irregularity. EF 50%. Successful PCI to the circumflex marginal vessel with ultimate insertion of a Synergy DES 2.528 mm stent postdilated 2.75 mm with the 99% stenosis reduced to 0% and resumption of brisk TIMI-3 flow. Plan is for medical therapy for his RCA. If his mid RCA lesion progresses, future stenting may be necessary.  He tolerated the procedure well and left the cath lab in stable condition. He was placed on DAPT with ASA + Brilinta. He had no chest pain and no dyspnea. No difficulties ambulating with cardiac rehab. His statin was changed from simvastatin to Lipitor. His home ARB and BB were resumed. Vital signs and renal function remained stable. He was advised to stay out of work for 1 week and was given work note to return on 01/09/17. He was given a 30 day Brilinta card and Rx with refills x 1 year.  He was last seen and examined by Dr. Antoine Poche, who determined he was stable for d/c home. He  has post hospital f/u scheduled with Herma Carson, PA-C, on 01/13/17.   Consultants: none    Discharge Vitals Blood pressure 131/85, pulse 72, temperature 98 F (36.7 C), temperature source Oral, resp. rate 13, height 6\' 2"  (1.88 m), weight 230 lb 9.6 oz (104.6 kg), SpO2 98 %.  Filed Weights   01/01/17 0718 01/02/17 0404  Weight: 215 lb (97.5 kg) 230 lb 9.6 oz (104.6 kg)    Labs & Radiologic Studies    CBC  Recent Labs  12/30/16 1419 01/02/17 0227  WBC 6.6  8.0  NEUTROABS 4.5  --   HGB  --  13.5  HCT 43.5 40.6  MCV 87 88.3  PLT 174 151   Basic Metabolic Panel  Recent Labs  12/30/16 1419 01/02/17 0227  NA 146* 142  K 4.0 3.9  CL 103 109  CO2 25 27  GLUCOSE 93 93  BUN 14 13  CREATININE 0.97 1.00  CALCIUM 9.1 8.4*   Liver Function Tests No results for input(s): AST, ALT, ALKPHOS, BILITOT, PROT, ALBUMIN in the last 72 hours. No results for input(s): LIPASE, AMYLASE in the last 72 hours. Cardiac Enzymes No results for input(s): CKTOTAL, CKMB, CKMBINDEX, TROPONINI in the last 72 hours. BNP Invalid input(s): POCBNP D-Dimer No results for input(s): DDIMER in the last 72 hours. Hemoglobin A1C No results for input(s): HGBA1C in the last 72 hours. Fasting Lipid Panel No results for input(s): CHOL, HDL, LDLCALC, TRIG, CHOLHDL, LDLDIRECT in the last 72 hours. Thyroid Function Tests No results for input(s): TSH, T4TOTAL, T3FREE, THYROIDAB in the last 72 hours.  Invalid input(s): FREET3 _____________  Dg Chest 2 View  Result Date: 12/30/2016 CLINICAL DATA:  Preop for cardiac catheterization EXAM: CHEST  2 VIEW COMPARISON:  None. FINDINGS: Cardiomediastinal silhouette is unremarkable. No infiltrate or pleural effusion. No pulmonary edema. Mild degenerative changes mid and lower thoracic spine. IMPRESSION: No active cardiopulmonary disease. Electronically Signed   By: Natasha Mead M.D.   On: 12/30/2016 15:51   Disposition   Pt is being discharged home today in good condition.  Follow-up Plans & Appointments    Follow-up Information    Jacolyn Reedy, PA-C Follow up on 01/13/2017.   Specialty:  Cardiology Why:  10:45 am  Contact information: [address] [place] [address] [phone]          Discharge Instructions    Amb Referral to Cardiac Rehabilitation    Complete by:  As directed    Diagnosis:  Coronary Stents   Diet - low sodium heart healthy    Complete by:  As directed    Increase activity  slowly    Complete by:  As directed       Discharge Medications   Current Discharge Medication List    START taking these medications   Details  atorvastatin (LIPITOR) 80 MG tablet Take 1 tablet (80 mg total) by mouth daily at 6 PM. Qty: 30 tablet, Refills: 5    !! ticagrelor (BRILINTA) 90 MG TABS tablet Take 1 tablet (90 mg total) by mouth 2 (two) times daily. Qty: 180 tablet, Refills: 3    !! ticagrelor (BRILINTA) 90 MG TABS tablet Take 1 tablet (90 mg total) by mouth 2 (two) times daily. Qty: 60 tablet, Refills: 0     !! - Potential duplicate medications found. Please discuss with provider.    CONTINUE these medications which have NOT CHANGED   Details  aspirin 81 MG tablet Take 81 mg by mouth  daily.      cholecalciferol (VITAMIN D) 1000 UNITS tablet Take 1,000 Units by mouth daily.    losartan (COZAAR) 25 MG tablet Take 0.5 tablets (12.5 mg total) by mouth daily. Qty: 15 tablet, Refills: 2    metoprolol succinate (TOPROL-XL) 25 MG 24 hr tablet Take 1 tablet (25 mg total) by mouth daily. Qty: 90 tablet, Refills: 3    Turmeric 500 MG CAPS Take 500 mg by mouth daily.    vitamin B-12 (CYANOCOBALAMIN) 1000 MCG tablet Take 1,000 mcg by mouth daily.         Aspirin prescribed at discharge?  Yes High Intensity Statin Prescribed? (Lipitor 40-80mg  or Crestor 20-40mg ): Yes Beta Blocker Prescribed? Yes For EF <40%, was ACEI/ARB Prescribed? Yes ADP Receptor Inhibitor Prescribed? (i.e. Plavix etc.-Includes Medically Managed Patients): Yes For EF <40%, Aldosterone Inhibitor Prescribed? No , EF >40% Was EF assessed during THIS hospitalization? Yes Was Cardiac Rehab II ordered? (Included Medically managed Patients): Yes   Outstanding Labs/Studies   None   Duration of Discharge Encounter   Greater than 30 minutes including physician time.  Signed, Robbie Lis PA-C 01/02/2017, 10:06 AM  Patient seen and examined.  Plan as discussed in my rounding note for today and  outlined above. Fayrene Fearing [facility]  01/02/2017  2:24 PM

## 2017-01-02 NOTE — Progress Notes (Signed)
Pt and wife given all discharge information as well as hard prescriptions for brilinta, lipitor and ntg sl. Pt and wife verbalized understanding of all.  Pt discussed return to work with Dr Antoine Poche for Jan 19th and did receive a work note prior to discharge. All belongings with pt at discharge. Home with wife.

## 2017-01-02 NOTE — Progress Notes (Signed)
CARDIAC REHAB PHASE I   PRE:  Rate/Rhythm: 74 SR PVCs  BP:  Supine: 143/91  Sitting:   Standing:    SaO2:   MODE:  Ambulation: 800 ft   POST:  Rate/Rhythm: 89 SR PVCs  BP:  Supine:   Sitting: 150/96  Standing:    SaO2:  0750-0835 Pt walked 800 ft with steady gait. Tolerated well. No CP. Education completed with pt and wife who voiced understanding. Reviewed NTG use, risk factors, ex ed and gave heart healthy diet. Stressed importance of brilinta with stent. Need to see case manager. Notified PA that pt needs two prescriptions for brilinta as they have to do mail order 50months at a time after getting one month prescription. Discussed CRP 2 and will refer to [place].   Luetta Nutting, RN BSN  01/02/2017 8:30 AM

## 2017-01-02 NOTE — Progress Notes (Signed)
S/W [facility] @ [facility] # [phone]   BRILINTA  90 MG BID   COVER- YES  CO-PAY- ZERO DOLLARS  PRIOR APPROVAL- NO  PHARMACY : [facility]

## 2017-01-02 NOTE — Care Management Note (Addendum)
Case Management Note  Patient Details  Name: RYANCHRISTOPHER GROPP MRN: [id number] Date of Birth: Oct 31, 1951  Subjective/Objective:     S/p coronary stent intervention, NCM awaitng benefit check for brilinta.  Per benefit check co pay is  0 dollars, patient is indep pta, for dc today no other needs.  He goes to  [facility] and they do have brilinta in stock .                                Action/Plan:   Expected Discharge Date:  01/02/17               Expected Discharge Plan:  Home/Self Care  In-House Referral:     Discharge planning Services  CM Consult  Post Acute Care Choice:    Choice offered to:     DME Arranged:    DME Agency:     HH Arranged:    HH Agency:     Status of Service:  Completed, signed off  If discussed at [employer] of Stay Meetings, dates discussed:    Additional Comments:  Leone Haven, RN 01/02/2017, 10:42 AM

## 2017-01-12 ENCOUNTER — Ambulatory Visit: Payer: 59 | Admitting: Physician Assistant

## 2017-01-12 ENCOUNTER — Telehealth (HOSPITAL_COMMUNITY): Payer: Self-pay | Admitting: Family Medicine

## 2017-01-12 NOTE — Telephone Encounter (Signed)
S/w Ronalee Belts with [facility] [employer] confirming verification for pt to have Cardiac Reh. Co-pay $20.00, Out Of Pocket $2,300.00, which all has been met Co-pay has been waived, no limits,  no co-insurance  Reference # [id number]

## 2017-01-13 ENCOUNTER — Encounter: Payer: Self-pay | Admitting: Physician Assistant

## 2017-01-13 ENCOUNTER — Ambulatory Visit (INDEPENDENT_AMBULATORY_CARE_PROVIDER_SITE_OTHER): Payer: 59 | Admitting: Physician Assistant

## 2017-01-13 VITALS — BP 130/90 | HR 68 | Ht 74.0 in | Wt 223.0 lb

## 2017-01-13 DIAGNOSIS — I493 Ventricular premature depolarization: Secondary | ICD-10-CM | POA: Diagnosis not present

## 2017-01-13 DIAGNOSIS — I255 Ischemic cardiomyopathy: Secondary | ICD-10-CM

## 2017-01-13 DIAGNOSIS — E785 Hyperlipidemia, unspecified: Secondary | ICD-10-CM | POA: Diagnosis not present

## 2017-01-13 DIAGNOSIS — I2510 Atherosclerotic heart disease of native coronary artery without angina pectoris: Secondary | ICD-10-CM | POA: Diagnosis not present

## 2017-01-13 MED ORDER — LOSARTAN POTASSIUM 25 MG PO TABS: 12.0000 mg | ORAL_TABLET | Freq: Every day | ORAL | 3 refills | Status: DC

## 2017-01-13 NOTE — Progress Notes (Signed)
Cardiology Office Note    Date:  01/13/2017   ID:  Randy Pittman, DOB August 15, 1951, MRN [id number]  PCP:  Crawford Givens, MD  Cardiologist: Dr. Delton See  Chief Complaint  Patient presents with  . Follow-up    History of Present Illness:  Randy Pittman is a 66 y.o. male who had a [facility] screening and had an irregular heartbeat and was told to see primary care. He had a lot of PVCs on his EKG and I ordered a stress test. This was done 12/27/15 that showed moderate appearing inferior wall ischemia from the apex to the base and diffuse hypokinesis worse in the apex EF 43%. He was referred for left heart catheterization.  01/01/17 Showed Mild LV Dysfunction Ejection Fraction 50% with Small Focal Region of Distal Inferior Hypocontractility. He Had Significant 2 Vessel CAD with a Subtotal 99% OM1 and 65% Mid RCA with Total Occlusion of the PLA with Collaterals. He Underwent Successful PCI to the Circumflex Marginal with a DES. RCA will be treated medically. If the mid RCA lesion progresses future stenting may be necessary.  Patient comes in today for follow-up he has no cardiac complaints. He had no symptoms prior to cath. I answered questions concerning blood pressure and diet. He is taken his blood pressures at home and they are 100-120/70-80. Blood pressure is a little high today because nervous.      Past Medical History:  Diagnosis Date  . Hyperlipidemia 07/1995  . Low testosterone    per Dr. Reuel Boom, with urology    Past Surgical History:  Procedure Laterality Date  . CARDIAC CATHETERIZATION N/A 01/01/2017   Procedure: Left Heart Cath and Coronary Angiography;  Surgeon: Lennette Bihari, MD;  Location: [facility] INVASIVE CV LAB;  Service: Cardiovascular;  Laterality: N/A;  . CARDIAC CATHETERIZATION N/A 01/01/2017   Procedure: Coronary Stent Intervention;  Surgeon: Lennette Bihari, MD;  Location: [facility] INVASIVE CV LAB;  Service: Cardiovascular;  Laterality: N/A;  . COLONOSCOPY    . CORONARY  ANGIOPLASTY WITH STENT PLACEMENT  01/01/2017    Current Medications: Outpatient Medications Prior to Visit  Medication Sig Dispense Refill  . aspirin 81 MG tablet Take 81 mg by mouth daily.      Marland Kitchen atorvastatin (LIPITOR) 80 MG tablet Take 1 tablet (80 mg total) by mouth daily at 6 PM. 30 tablet 5  . cholecalciferol (VITAMIN D) 1000 UNITS tablet Take 1,000 Units by mouth daily.    . nitroGLYCERIN (NITROSTAT) 0.4 MG SL tablet Place 1 tablet (0.4 mg total) under the tongue every 5 (five) minutes as needed for chest pain. 25 tablet 2  . ticagrelor (BRILINTA) 90 MG TABS tablet Take 1 tablet (90 mg total) by mouth 2 (two) times daily. 180 tablet 3  . vitamin B-12 (CYANOCOBALAMIN) 1000 MCG tablet Take 1,000 mcg by mouth daily.    Marland Kitchen losartan (COZAAR) 25 MG tablet Take 0.5 tablets (12.5 mg total) by mouth daily. 15 tablet 2  . metoprolol succinate (TOPROL-XL) 25 MG 24 hr tablet Take 1 tablet (25 mg total) by mouth daily. (Patient taking differently: Take 25 mg by mouth every evening. ) 90 tablet 3  . ticagrelor (BRILINTA) 90 MG TABS tablet Take 1 tablet (90 mg total) by mouth 2 (two) times daily. 60 tablet 0  . Turmeric 500 MG CAPS Take 500 mg by mouth daily.     No facility-administered medications prior to visit.      Allergies:   Patient has no known allergies.  Social History   Social History  . Marital status: Married    Spouse name: N/A  . Number of children: N/A  . Years of education: N/A   Occupational History  . [employer]    Social History Main Topics  . Smoking status: Former Smoker    Packs/day: 2.00    Years: 5.00    Types: Cigarettes    Quit date: 72  . Smokeless tobacco: Former Neurosurgeon    Types: Chew    Quit date: 66  . Alcohol use 2.4 oz/week    2 Glasses of wine, 2 Cans of beer per week  . Drug use: No  . Sexual activity: Not Asked   Other Topics Concern  . 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   ROS:   Please see the history of present illness.    Review of Systems  Constitution: Negative.  HENT: Negative.   Cardiovascular: Negative.   Respiratory: Negative.   Endocrine: Negative.   Hematologic/Lymphatic: Negative.   Musculoskeletal: Negative.   Gastrointestinal: Negative.   Genitourinary: Negative.   Neurological: Negative.    All other systems reviewed and are negative.   PHYSICAL EXAM:   VS:  BP 130/90 (BP Location: Right Arm, Patient Position: Sitting, Cuff Size: Normal)   Pulse 68   Ht 6\' 2"  (1.88 m)   Wt 223 lb (101.2 kg)   BMI 28.63 kg/m   Physical Exam  GEN: Well nourished, well developed, in no acute distress  Neck: no JVD, carotid bruits, or masses Cardiac:RRR; no murmurs, rubs, or gallops  Respiratory:  clear to auscultation bilaterally, normal work of breathing GI: soft, nontender, nondistended, + BS Ext: without cyanosis, clubbing, or edema, Good distal pulses bilaterally Psych: mood, full affect  Wt Readings from Last 3 Encounters:  01/13/17 223 lb (101.2 kg)  01/02/17 230 lb 9.6 oz (104.6 kg)  12/30/16 215 lb (97.5 kg)      Studies/Labs Reviewed:   EKG:  EKG is  ordered today.  The ekg ordered today demonstrates Normal sinus rhythm with PAC  Recent Labs: 12/05/2016: ALT 20; TSH 1.10 01/02/2017: BUN 13; Creatinine, Ser 1.00; Hemoglobin 13.5; Platelets 151; Potassium 3.9; Sodium 142   Lipid Panel    Component Value Date/Time   CHOL 151 08/01/2016 0816   TRIG 96.0 08/01/2016 0816   HDL 51.30 08/01/2016 0816   CHOLHDL 3 08/01/2016 0816   VLDL 19.2 08/01/2016 0816   LDLCALC 80 08/01/2016 0816   LDLDIRECT 169.4 07/09/2012 1021    Additional  studies/ records that were reviewed today include:  Cardiac catheterization 01/01/17 Procedures    Coronary Stent Intervention  Left Heart Cath and Coronary Angiography  Conclusion       There is mild left ventricular systolic dysfunction.  Dist RCA lesion, 100 %stenosed.  Mid RCA lesion, 65 %stenosed.  A STENT SYNERGY DES 2.5X28 drug eluting stent was successfully placed.  Lat 1st Mrg lesion, 99 %stenosed.  Post intervention, there is a 0% residual stenosis.   Low-normal to mild LV dysfunction with an ejection fraction of 50% with a small focal region of  distal inferior hypocontractility.   Significant 2 vessel coronary obstructive disease with a subtotal 99 100% stenosis involving the inferior branch of the obtuse marginal 1 vessel; and 65% mid RCA stenosis proximal to the acute margin with total occlusion of the PLA with retrograde collateralization to the distal PLA via the AV groove circumflex branches.  LAD with mild luminal irregularity.   Successful PCI to the circumflex marginal vessel with ultimate insertion of a Synergy DES 2.528 mm stent postdilated 2.75 mm with the 99% stenosis reduced to 0% and resumption of brisk TIMI-3 flow    2-D echo 12/26/16   Study Conclusions   - Left ventricle: The cavity size was normal. Wall thickness was   increased in a pattern of mild LVH. Systolic function was mildly   to moderately reduced. The estimated ejection fraction was in the   range of 40% to 45%. Moderate hypokinesis of the   mid-apicalinferoseptal myocardium. Doppler parameters are   consistent with abnormal left ventricular relaxation (grade 1   diastolic dysfunction). - Aortic valve: There was trivial regurgitation. - Right atrium: The atrium was mildly dilated.   Nuclear stress test 12/26/16 Study Highlights     Nuclear stress EF: 43%.  Findings consistent with ischemia.  This is an intermediate risk study.  The left ventricular ejection fraction is moderately  decreased (30-44%).  Upsloping ST segment depression ST segment depression of 1 mm was noted during stress in the I, aVL, V5 and V6 leads.   Moderate appearing inferior wall ischemia from apex to base Diffuse hypokinesis worse in apex EF 43%        ASSESSMENT:    1. CAD in native artery   2. PVC's (premature ventricular contractions)   3. Hyperlipidemia, unspecified hyperlipidemia type   4. Cardiomyopathy, ischemic      PLAN:  In order of problems listed above:   CAD status post DES to the circumflex with residual RCA disease treated medically and continue aspirin, Lipitor, Brilinta and Toprol and Cozaar. Follow-up with Dr. Delton See in February  PVCs improved with metoprolol  Hyperlipidemia on Lipitor. Check fasting lipid panel in 6 weeks  Ischemic cardiomyopathy ejection fraction 40-50% asymptomatic     Medication Adjustments/Labs and Tests Ordered: Current medicines are reviewed at length with the patient today.  Concerns regarding medicines are outlined above.  Medication changes, Labs and Tests ordered today are listed in the Patient Instructions below. Patient Instructions  Medication Instructions:  Your physician recommends that you continue on your current medications as directed. Please refer to the Current Medication list given to you today.   Labwork: 02/18/17 FASTING LIPID AND LIVER PANEL TO BE DONE THE SAME DAY YOU SEE DR. Delton See  Testing/Procedures: NONE  Follow-Up: KEEP YOUR APPT WITH DR. Delton See 02/19/16  Any Other Special Instructions Will Be Listed Below (If Applicable).     If you need a refill on your cardiac medications before your next appointment, please call your pharmacy.      Elson Clan, PA-C  01/13/2017 11:11 AM    [facility] [address] [place], [place], [place]  [address] Phone: [phone]; Fax: [phone]

## 2017-01-13 NOTE — Patient Instructions (Signed)
Medication Instructions:  Your physician recommends that you continue on your current medications as directed. Please refer to the Current Medication list given to you today.   Labwork: 02/18/17 FASTING LIPID AND LIVER PANEL TO BE DONE THE SAME DAY YOU SEE DR. Delton See  Testing/Procedures: NONE  Follow-Up: KEEP YOUR APPT WITH DR. Delton See 02/19/16  Any Other Special Instructions Will Be Listed Below (If Applicable).     If you need a refill on your cardiac medications before your next appointment, please call your pharmacy.

## 2017-02-06 ENCOUNTER — Encounter: Payer: Self-pay | Admitting: Cardiology

## 2017-02-11 ENCOUNTER — Other Ambulatory Visit: Payer: Self-pay | Admitting: Cardiology

## 2017-02-18 ENCOUNTER — Encounter: Payer: Self-pay | Admitting: *Deleted

## 2017-02-18 ENCOUNTER — Ambulatory Visit (INDEPENDENT_AMBULATORY_CARE_PROVIDER_SITE_OTHER): Payer: 59 | Admitting: Cardiology

## 2017-02-18 ENCOUNTER — Encounter: Payer: Self-pay | Admitting: Cardiology

## 2017-02-18 VITALS — BP 128/72 | HR 85 | Ht 74.0 in | Wt 220.0 lb

## 2017-02-18 DIAGNOSIS — I519 Heart disease, unspecified: Secondary | ICD-10-CM

## 2017-02-18 DIAGNOSIS — E785 Hyperlipidemia, unspecified: Secondary | ICD-10-CM

## 2017-02-18 DIAGNOSIS — Z8249 Family history of ischemic heart disease and other diseases of the circulatory system: Secondary | ICD-10-CM | POA: Diagnosis not present

## 2017-02-18 DIAGNOSIS — I493 Ventricular premature depolarization: Secondary | ICD-10-CM

## 2017-02-18 DIAGNOSIS — I2510 Atherosclerotic heart disease of native coronary artery without angina pectoris: Secondary | ICD-10-CM | POA: Diagnosis not present

## 2017-02-18 DIAGNOSIS — I10 Essential (primary) hypertension: Secondary | ICD-10-CM | POA: Diagnosis not present

## 2017-02-18 DIAGNOSIS — I255 Ischemic cardiomyopathy: Secondary | ICD-10-CM

## 2017-02-18 NOTE — Patient Instructions (Signed)
Medication Instructions:   Your physician recommends that you continue on your current medications as directed. Please refer to the Current Medication list given to you today.     Testing/Procedures:  Your physician has requested that you have an echocardiogram. Echocardiography is a painless test that uses sound waves to create images of your heart. It provides your doctor with information about the size and shape of your heart and how well your heart's chambers and valves are working. This procedure takes approximately one hour. There are no restrictions for this procedure.  PLEASE HAVE THIS SCHEDULED A WEEK PRIOR TOO YOUR 3  MONTH FOLLOW-UP APPOINTMENT WITH DR Delton See      Follow-Up:  3 MONTHS WITH DR NELSON---PLEASE HAVE YOUR ECHO SCHEDULED A WEEK PRIOR TO THIS OFFICE VISIT  .     If you need a refill on your cardiac medications before your next appointment, please call your pharmacy.

## 2017-02-18 NOTE — Progress Notes (Signed)
Cardiology Office Note    Date:  02/19/2017   ID:  Randy HOLZHAUER, DOB 1951-01-07, MRN [id number]  PCP:  Crawford Givens, MD  Cardiologist: Dr. Delton See  No chief complaint on file.   History of Present Illness:  Randy Pittman is a 66 y.o. male who had a [facility] screening and had an irregular heartbeat and was told to see primary care. He had a lot of PVCs on his EKG and I ordered a stress test. This was done 12/27/15 that showed moderate appearing inferior wall ischemia from the apex to the base and diffuse hypokinesis worse in the apex EF 43%. He was referred for left heart catheterization.  01/01/17 Showed Mild LV Dysfunction Ejection Fraction 50% with Small Focal Region of Distal Inferior Hypocontractility. He Had Significant 2 Vessel CAD with a Subtotal 99% OM1 and 65% Mid RCA with Total Occlusion of the PLA with Collaterals. He Underwent Successful PCI to the Circumflex Marginal with a DES. RCA will be treated medically. If the mid RCA lesion progresses future stenting may be necessary.  02/18/2017 - 6 weeks follow up, the patient is working full time and is completely asymptomatic, he has no chest pain, SOB, no palpitations, claudications, no LE edema. He got tested as he has significant FH of premature CAD including his twin brother who has recently received several stents.  He is complaint with his meds and has no side effects. He is exercising on a stationary bike and is asking how much he can exercise.   Past Medical History:  Diagnosis Date  . Hyperlipidemia 07/1995  . Low testosterone    per Dr. Reuel Boom, with urology    Past Surgical History:  Procedure Laterality Date  . CARDIAC CATHETERIZATION N/A 01/01/2017   Procedure: Left Heart Cath and Coronary Angiography;  Surgeon: Lennette Bihari, MD;  Location: [facility] INVASIVE CV LAB;  Service: Cardiovascular;  Laterality: N/A;  . CARDIAC CATHETERIZATION N/A 01/01/2017   Procedure: Coronary Stent Intervention;  Surgeon: Lennette Bihari, MD;   Location: [facility] INVASIVE CV LAB;  Service: Cardiovascular;  Laterality: N/A;  . COLONOSCOPY    . CORONARY ANGIOPLASTY WITH STENT PLACEMENT  01/01/2017    Current Medications: Outpatient Medications Prior to Visit  Medication Sig Dispense Refill  . aspirin 81 MG tablet Take 81 mg by mouth daily.      Marland Kitchen atorvastatin (LIPITOR) 80 MG tablet Take 1 tablet (80 mg total) by mouth daily at 6 PM. 30 tablet 5  . cholecalciferol (VITAMIN D) 1000 UNITS tablet Take 1,000 Units by mouth daily.    Marland Kitchen losartan (COZAAR) 25 MG tablet Take 0.5 tablets (12.5 mg total) by mouth daily. 90 tablet 3  . metoprolol succinate (TOPROL-XL) 25 MG 24 hr tablet Take 25 mg by mouth every evening.    . nitroGLYCERIN (NITROSTAT) 0.4 MG SL tablet Place 1 tablet (0.4 mg total) under the tongue every 5 (five) minutes as needed for chest pain. 75 tablet 1  . ticagrelor (BRILINTA) 90 MG TABS tablet Take 1 tablet (90 mg total) by mouth 2 (two) times daily. 180 tablet 3  . vitamin B-12 (CYANOCOBALAMIN) 1000 MCG tablet Take 1,000 mcg by mouth daily.     No facility-administered medications prior to visit.      Allergies:   Patient has no known allergies.   Social History   Social History  . Marital status: Married    Spouse name: N/A  . Number of children: N/A  . Years of education: N/A  Occupational History  . [employer] tech [employer]    Social History Main Topics  . Smoking status: Former Smoker    Packs/day: 2.00    Years: 5.00    Types: Cigarettes    Quit date: 74  . Smokeless tobacco: Former Neurosurgeon    Types: Chew    Quit date: 66  . Alcohol use 2.4 oz/week    2 Glasses of wine, 2 Cans of beer per week  . Drug use: No  . Sexual activity: Not Asked   Other Topics Concern  . 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   ROS:   Please see the history of present illness.    Review of Systems  Constitution: Negative.  HENT: Negative.   Cardiovascular: Negative.   Respiratory: Negative.   Endocrine: Negative.   Hematologic/Lymphatic: Negative.   Musculoskeletal: Negative.   Gastrointestinal: Negative.   Genitourinary: Negative.   Neurological: Negative.    All other systems reviewed and are negative.   PHYSICAL EXAM:   VS:  BP 128/72   Pulse 85   Ht 6\' 2"  (1.88 m)   Wt 220 lb (99.8 kg)   SpO2 96%   BMI 28.25 kg/m   Physical Exam  GEN: Well nourished, well developed, in no acute distress  Neck: no JVD, carotid bruits, or masses Cardiac:RRR; no murmurs, rubs, or gallops  Respiratory:  clear to auscultation bilaterally, normal work of breathing GI: soft, nontender, nondistended, + BS Ext: without cyanosis, clubbing, or edema, Good distal pulses bilaterally Psych: mood, full affect  Wt Readings from Last 3 Encounters:  02/18/17 220 lb (99.8 kg)  01/13/17 223 lb (101.2 kg)  01/02/17 230 lb 9.6 oz (104.6 kg)      Studies/Labs Reviewed:   EKG:  EKG is  ordered today.  The ekg ordered today demonstrates Normal sinus rhythm with PAC  Recent Labs: 12/05/2016: ALT 20; TSH 1.10 01/02/2017: BUN 13; Creatinine, Ser 1.00; Hemoglobin 13.5; Platelets 151; Potassium 3.9; Sodium 142   Lipid Panel    Component Value Date/Time   CHOL 151 08/01/2016 0816   TRIG 96.0 08/01/2016 0816   HDL 51.30 08/01/2016 0816   CHOLHDL 3 08/01/2016 0816   VLDL 19.2 08/01/2016 0816   LDLCALC 80 08/01/2016 0816   LDLDIRECT 169.4 07/09/2012 1021    Additional studies/ records that were reviewed today include:  Cardiac catheterization 01/01/17 Procedures    Coronary Stent Intervention  Left Heart Cath and Coronary Angiography  Conclusion       There is mild  left ventricular systolic dysfunction.  Dist RCA lesion, 100 %stenosed.  Mid RCA lesion, 65 %stenosed.  A STENT SYNERGY DES 2.5X28 drug eluting stent was successfully placed.  Lat 1st Mrg lesion, 99 %stenosed.  Post intervention, there is a 0% residual stenosis.   Low-normal to mild LV dysfunction with an ejection fraction of 50% with a small focal region of distal inferior hypocontractility.   Significant 2 vessel coronary obstructive disease with a subtotal 99 100% stenosis involving the inferior branch of the obtuse marginal 1 vessel; and 65% mid RCA stenosis proximal to the acute margin  with total occlusion of the PLA with retrograde collateralization to the distal PLA via the AV groove circumflex branches.  LAD with mild luminal irregularity.   Successful PCI to the circumflex marginal vessel with ultimate insertion of a Synergy DES 2.528 mm stent postdilated 2.75 mm with the 99% stenosis reduced to 0% and resumption of brisk TIMI-3 flow    2-D echo 12/26/16   Study Conclusions   - Left ventricle: The cavity size was normal. Wall thickness was   increased in a pattern of mild LVH. Systolic function was mildly   to moderately reduced. The estimated ejection fraction was in the   range of 40% to 45%. Moderate hypokinesis of the   mid-apicalinferoseptal myocardium. Doppler parameters are   consistent with abnormal left ventricular relaxation (grade 1   diastolic dysfunction). - Aortic valve: There was trivial regurgitation. - Right atrium: The atrium was mildly dilated.   Nuclear stress test 12/26/16 Study Highlights     Nuclear stress EF: 43%.  Findings consistent with ischemia.  This is an intermediate risk study.  The left ventricular ejection fraction is moderately decreased (30-44%).  Upsloping ST segment depression ST segment depression of 1 mm was noted during stress in the I, aVL, V5 and V6 leads.   Moderate appearing inferior wall ischemia from apex to  base Diffuse hypokinesis worse in apex EF 43%        ASSESSMENT:    1. Cardiomyopathy, ischemic   2. Hyperlipidemia, unspecified hyperlipidemia type   3. CAD in native artery   4. PVC's (premature ventricular contractions)   5. Family history of early CAD   6. Left ventricular dysfunction   7. Essential hypertension    PLAN:  In order of problems listed above:  CAD status post DES to the circumflex on 01/01/2017 with residual RCA disease treated medically and continue aspirin, Lipitor, Brilinta and Toprol and Cozaar. He is completely asymptomatic. We will repeat an echocardiogram to reevaluate his LVEF prior to the next visit in 3 months. No bleeding/bruising.  PVCs improved with metoprolol  Hyperlipidemia on Lipitor. We will recheck at the next visit.  Ischemic cardiomyopathy ejection fraction 40-50% asymptomatic, we will repeat an echocardiogram to reevaluate his LVEF prior to the next visit in 3 months.   Medication Adjustments/Labs and Tests Ordered: Current medicines are reviewed at length with the patient today.  Concerns regarding medicines are outlined above.  Medication changes, Labs and Tests ordered today are listed in the Patient Instructions below. Patient Instructions  Medication Instructions:   Your physician recommends that you continue on your current medications as directed. Please refer to the Current Medication list given to you today.     Testing/Procedures:  Your physician has requested that you have an echocardiogram. Echocardiography is a painless test that uses sound waves to create images of your heart. It provides your doctor with information about the size and shape of your heart and how well your heart's chambers and valves are working. This procedure takes approximately one hour. There are no restrictions for this procedure.  PLEASE HAVE THIS SCHEDULED A WEEK PRIOR TOO YOUR 3  MONTH FOLLOW-UP APPOINTMENT WITH DR Delton See      Follow-Up:  3  MONTHS WITH DR Bettyjean Stefanski---PLEASE HAVE YOUR ECHO SCHEDULED A WEEK PRIOR TO THIS OFFICE VISIT  .     If you need a refill on your cardiac medications before your next appointment, please call your pharmacy.      Signed, Tobias Alexander, MD  02/19/2017  2:06 PM    [facility] [address] [place], [place], [place]  [address] Phone: [phone]; Fax: [phone]

## 2017-03-21 ENCOUNTER — Other Ambulatory Visit: Payer: Self-pay | Admitting: Cardiology

## 2017-04-21 ENCOUNTER — Encounter: Payer: Self-pay | Admitting: Cardiology

## 2017-05-01 ENCOUNTER — Other Ambulatory Visit: Payer: Self-pay

## 2017-05-01 ENCOUNTER — Other Ambulatory Visit: Payer: 59 | Admitting: *Deleted

## 2017-05-01 ENCOUNTER — Other Ambulatory Visit: Payer: Self-pay | Admitting: Cardiology

## 2017-05-01 ENCOUNTER — Ambulatory Visit (HOSPITAL_COMMUNITY): Payer: 59 | Attending: Cardiology

## 2017-05-01 DIAGNOSIS — I2510 Atherosclerotic heart disease of native coronary artery without angina pectoris: Secondary | ICD-10-CM

## 2017-05-01 DIAGNOSIS — E785 Hyperlipidemia, unspecified: Secondary | ICD-10-CM

## 2017-05-01 DIAGNOSIS — I255 Ischemic cardiomyopathy: Secondary | ICD-10-CM

## 2017-05-01 DIAGNOSIS — I10 Essential (primary) hypertension: Secondary | ICD-10-CM | POA: Insufficient documentation

## 2017-05-01 DIAGNOSIS — I351 Nonrheumatic aortic (valve) insufficiency: Secondary | ICD-10-CM | POA: Insufficient documentation

## 2017-05-01 DIAGNOSIS — Z87891 Personal history of nicotine dependence: Secondary | ICD-10-CM | POA: Diagnosis not present

## 2017-05-01 LAB — HEPATIC FUNCTION PANEL
ALBUMIN: 4.4 g/dL (ref 3.6–4.8)
ALT: 18 IU/L (ref 0–44)
AST: 17 IU/L (ref 0–40)
Alkaline Phosphatase: 56 IU/L (ref 39–117)
Bilirubin Total: 0.6 mg/dL (ref 0.0–1.2)
Bilirubin, Direct: 0.17 mg/dL (ref 0.00–0.40)
TOTAL PROTEIN: 6.5 g/dL (ref 6.0–8.5)

## 2017-05-01 LAB — LIPID PANEL
CHOL/HDL RATIO: 2.5 ratio (ref 0.0–5.0)
Cholesterol, Total: 133 mg/dL (ref 100–199)
HDL: 54 mg/dL (ref >39)
LDL CALC: 67 mg/dL (ref 0–99)
Triglycerides: 60 mg/dL (ref 0–149)
VLDL CHOLESTEROL CAL: 12 mg/dL (ref 5–40)

## 2017-05-04 ENCOUNTER — Telehealth: Payer: Self-pay | Admitting: Cardiology

## 2017-05-04 NOTE — Telephone Encounter (Signed)
New message    Pt is calling stating that he is returning call to [place]. He doesn't know what it was about.

## 2017-05-04 NOTE — Telephone Encounter (Signed)
Returned pts call and discussed his lab results.

## 2017-05-08 ENCOUNTER — Ambulatory Visit (INDEPENDENT_AMBULATORY_CARE_PROVIDER_SITE_OTHER): Payer: 59 | Admitting: Cardiology

## 2017-05-08 ENCOUNTER — Encounter: Payer: Self-pay | Admitting: Cardiology

## 2017-05-08 VITALS — BP 142/72 | HR 71 | Ht 74.0 in | Wt 218.0 lb

## 2017-05-08 DIAGNOSIS — I2510 Atherosclerotic heart disease of native coronary artery without angina pectoris: Secondary | ICD-10-CM | POA: Diagnosis not present

## 2017-05-08 DIAGNOSIS — I255 Ischemic cardiomyopathy: Secondary | ICD-10-CM

## 2017-05-08 DIAGNOSIS — I10 Essential (primary) hypertension: Secondary | ICD-10-CM | POA: Diagnosis not present

## 2017-05-08 DIAGNOSIS — E785 Hyperlipidemia, unspecified: Secondary | ICD-10-CM | POA: Diagnosis not present

## 2017-05-08 DIAGNOSIS — I493 Ventricular premature depolarization: Secondary | ICD-10-CM | POA: Diagnosis not present

## 2017-05-08 MED ORDER — METOPROLOL SUCCINATE ER 25 MG PO TB24: 25.0000 mg | ORAL_TABLET | Freq: Every evening | ORAL | 3 refills | Status: DC

## 2017-05-08 MED ORDER — TICAGRELOR 90 MG PO TABS: 90.0000 mg | ORAL_TABLET | Freq: Two times a day (BID) | ORAL | 3 refills | Status: DC

## 2017-05-08 MED ORDER — ATORVASTATIN CALCIUM 80 MG PO TABS: ORAL_TABLET | ORAL | 3 refills | Status: DC

## 2017-05-08 MED ORDER — LOSARTAN POTASSIUM 25 MG PO TABS: 12.0000 mg | ORAL_TABLET | Freq: Every day | ORAL | 3 refills | Status: DC

## 2017-05-08 NOTE — Progress Notes (Signed)
Cardiology Office Note    Date:  05/08/2017   ID:  Randy Pittman, DOB [birth year], MRN [id number]  PCP:  Joaquim Nam, MD  Cardiologist: Dr. Delton See  Reason for visit: 3 months follow up  History of Present Illness:  Randy Pittman is a 66 y.o. male who had a [facility] screening and had an irregular heartbeat and was told to see primary care. He had a lot of PVCs on his EKG and I ordered a stress test. This was done 12/27/15 that showed moderate appearing inferior wall ischemia from the apex to the base and diffuse hypokinesis worse in the apex EF 43%. He was referred for left heart catheterization.  01/01/17 Showed Mild LV Dysfunction Ejection Fraction 50% with Small Focal Region of Distal Inferior Hypocontractility. He Had Significant 2 Vessel CAD with a Subtotal 99% OM1 and 65% Mid RCA with Total Occlusion of the PLA with Collaterals. He Underwent Successful PCI to the Circumflex Marginal with a DES. RCA will be treated medically. If the mid RCA lesion progresses future stenting may be necessary.  02/18/2017 - 6 weeks follow up, the patient is working full time and is completely asymptomatic, he has no chest pain, SOB, no palpitations, claudications, no LE edema. He got tested as he has significant FH of premature CAD including his twin brother who has recently received several stents.  He is complaint with his meds and has no side effects. He is exercising on a stationary bike and is asking how much he can exercise.   05/08/2017, the patient is coming after 3 months, he complains, he states that he has been doing great, he works night shifts and walks almost the entire time and that he exercises on a treadmill at home. His wife cooks and they both ate very healthy. He denies any further chest pain or shortness of breath, no lower extremity edema or claudication no palpitations presyncope or syncope. He has been tolerating statins well. No bleeding on DAPT.   Past Medical History:    Diagnosis Date  . Hyperlipidemia 07/1995  . Low testosterone    per Dr. Reuel Boom, with urology    Past Surgical History:  Procedure Laterality Date  . CARDIAC CATHETERIZATION Randy Pittman 01/01/2017   Procedure: Left Heart Cath and Coronary Angiography;  Surgeon: Lennette Bihari, MD;  Location: [facility] INVASIVE CV LAB;  Service: Cardiovascular;  Laterality: Randy Pittman;  . CARDIAC CATHETERIZATION Randy Pittman 01/01/2017   Procedure: Coronary Stent Intervention;  Surgeon: Lennette Bihari, MD;  Location: [facility] INVASIVE CV LAB;  Service: Cardiovascular;  Laterality: Randy Pittman;  . COLONOSCOPY    . CORONARY ANGIOPLASTY WITH STENT PLACEMENT  01/01/2017    Current Medications: Outpatient Medications Prior to Visit  Medication Sig Dispense Refill  . aspirin 81 MG tablet Take 81 mg by mouth daily.      . cholecalciferol (VITAMIN D) 1000 UNITS tablet Take 1,000 Units by mouth daily.    . nitroGLYCERIN (NITROSTAT) 0.4 MG SL tablet Place 1 tablet (0.4 mg total) under the tongue every 5 (five) minutes as needed for chest pain. 75 tablet 1  . vitamin B-12 (CYANOCOBALAMIN) 1000 MCG tablet Take 1,000 mcg by mouth daily.    Marland Kitchen atorvastatin (LIPITOR) 80 MG tablet TAKE 1 TABLET BY MOUTH  DAILY AT 6 PM. 90 tablet 0  . losartan (COZAAR) 25 MG tablet Take 0.5 tablets (12.5 mg total) by mouth daily. 90 tablet 3  . metoprolol succinate (TOPROL-XL) 25 MG 24 hr tablet Take 25 mg by  mouth every evening.    . ticagrelor (BRILINTA) 90 MG TABS tablet Take 1 tablet (90 mg total) by mouth 2 (two) times daily. 180 tablet 3   No facility-administered medications prior to visit.      Allergies:   Patient has no known allergies.   Social History   Social History  . Marital status: Married    Spouse name: Randy Pittman  . Number of children: Randy Pittman  . Years of education: Randy Pittman   Occupational History  . [employer] tech [employer]    Social History Main Topics  . Smoking status: Former Smoker    Packs/day: 2.00    Years: 5.00    Types: Cigarettes    Quit date: 74  .  Smokeless tobacco: Former Neurosurgeon    Types: Chew    Quit date: 77  . Alcohol use 2.4 oz/week    2 Glasses of wine, 2 Cans of beer per week  . Drug use: No  . Sexual activity: Not Asked   Other Topics Concern  . 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   ROS:   Please see the history of present illness.    Review of Systems  Constitution: Negative.  HENT: Negative.   Cardiovascular: Negative.   Respiratory: Negative.   Endocrine: Negative.   Hematologic/Lymphatic: Negative.   Musculoskeletal: Negative.   Gastrointestinal: Negative.   Genitourinary: Negative.   Neurological: Negative.    All other systems reviewed and are negative.   PHYSICAL EXAM:   VS:  BP (!) 142/72   Pulse 71   Ht 6\' 2"  (1.88 m)   Wt 218 lb (98.9 kg)   BMI 27.99 kg/m   Physical Exam  GEN: Well nourished, well developed, in no acute distress  Neck: no JVD, carotid bruits, or masses Cardiac:RRR; no murmurs, rubs, or gallops  Respiratory:  clear to auscultation bilaterally, normal work of breathing GI: soft, nontender, nondistended, + BS Ext: without cyanosis, clubbing, or edema, Good distal pulses bilaterally Psych: mood, full affect  Wt Readings from Last 3 Encounters:  05/08/17 218 lb (98.9 kg)  02/18/17 220 lb (99.8 kg)  01/13/17 223 lb (101.2 kg)      Studies/Labs Reviewed:   EKG:  EKG is  ordered today.  The ekg ordered today demonstrates Normal sinus rhythm with PAC  Recent Labs: 12/05/2016: TSH 1.10 01/02/2017: BUN 13; Creatinine, Ser 1.00; Hemoglobin 13.5; Platelets 151; Potassium 3.9; Sodium 142 05/01/2017: ALT 18   Lipid Panel     Component Value Date/Time   CHOL 133 05/01/2017 0804   TRIG 60 05/01/2017 0804   HDL 54 05/01/2017 0804   CHOLHDL 2.5 05/01/2017 0804   CHOLHDL 3 08/01/2016 0816   VLDL 19.2 08/01/2016 0816   LDLCALC 67 05/01/2017 0804   LDLDIRECT 169.4 07/09/2012 1021    Additional studies/ records that were reviewed today include:  Cardiac catheterization 01/01/17 Procedures    Coronary Stent Intervention  Left Heart Cath and Coronary Angiography  Conclusion       There is mild left ventricular systolic dysfunction.  Dist RCA lesion, 100 %stenosed.  Mid RCA lesion, 65 %stenosed.  A STENT SYNERGY DES 2.5X28 drug eluting stent was successfully placed.  Lat 1st Mrg lesion, 99 %stenosed.  Post intervention, there is a 0% residual stenosis.   Low-normal to mild LV dysfunction with an ejection fraction of 50% with a small focal region of distal inferior hypocontractility.   Significant 2 vessel coronary obstructive disease with a subtotal 99 100% stenosis involving the inferior branch of the obtuse marginal 1 vessel; and 65% mid RCA stenosis proximal to the acute margin with total occlusion of the PLA with retrograde collateralization to the distal PLA via the AV groove circumflex branches.  LAD with mild luminal irregularity.   Successful PCI to the circumflex marginal vessel with ultimate insertion of a Synergy DES 2.528 mm stent postdilated 2.75 mm with the 99% stenosis reduced to 0% and resumption of brisk TIMI-3 flow    2-D echo 12/26/16   Study Conclusions   - Left ventricle: The cavity size was normal. Wall thickness was   increased in a pattern of mild LVH. Systolic function was mildly   to moderately reduced. The estimated ejection fraction was in the   range of 40% to 45%. Moderate hypokinesis of the   mid-apicalinferoseptal myocardium. Doppler parameters are   consistent with abnormal left ventricular relaxation (grade 1   diastolic dysfunction). - Aortic valve: There was  trivial regurgitation. - Right atrium: The atrium was mildly dilated.  TTE: 05/01/2017 - Left ventricle: The cavity size was normal. Wall thickness was   increased in a pattern of mild LVH. There was moderate concentric   hypertrophy. Systolic function was normal. The estimated ejection   fraction was in the range of 50% to 55%. Diffuse hypokinesis.   Doppler parameters are consistent with abnormal left ventricular   relaxation (grade 1 diastolic dysfunction). - Aortic valve: There was mild regurgitation. - Aorta: Ascending aortic diameter: 42 mm (S). - Ascending aorta: The ascending aorta was mildly dilated. - Mitral valve: Calcified annulus. Mildly thickened, mildly   calcified leaflets . - Left atrium: The atrium was mildly dilated.   Nuclear stress test 12/26/16 Study Highlights     Nuclear stress EF: 43%.  Findings consistent with ischemia.  This is an intermediate risk study.  The left ventricular ejection fraction is moderately decreased (30-44%).  Upsloping ST segment depression ST segment depression of 1 mm was noted during stress in the I, aVL, V5 and V6 leads.   Moderate appearing inferior wall ischemia from apex to base Diffuse hypokinesis worse in apex EF 43%        ASSESSMENT:    1. CAD in native artery   2. Hyperlipidemia, unspecified hyperlipidemia type   3. Essential hypertension   4. Cardiomyopathy, ischemic   5. PVC's (premature ventricular contractions)    PLAN:  In order of problems listed above:  CAD status post DES to the circumflex on 01/01/2017 with residual RCA disease treated medically and continue aspirin, Lipitor, Brilinta and Toprol and Cozaar. He is completely asymptomatic. Repeat echocardiogram showed improved LVEF from 40-45% to 50-55%. No bleeding/bruising.  PVCs resolved with metoprolol  Hyperlipidemia on Lipitor. All lipids at goal, tolerating lipitor well.  BP well controlled.  Medication Adjustments/Labs and Tests  Ordered: Current medicines are reviewed at length with the patient today.  Concerns regarding medicines are outlined above.  Medication changes, Labs and Tests ordered today are listed in the Patient Instructions below. Patient Instructions  Medication Instructions:   Your physician recommends that you continue on your current medications as directed. Please  refer to the Current Medication list given to you today.    Follow-Up:  Your physician wants you to follow-up in: 6  MONTHS WITH  DR Johnell Comings will receive a reminder letter in the mail two months in advance. If you don't receive a letter, please call our office to schedule the follow-up appointment.        If you need a refill on your cardiac medications before your next appointment, please call your pharmacy.      Signed, Tobias Alexander, MD  05/08/2017 4:22 PM    [facility] [address] [place], [place], [place]  [address] Phone: [phone]; Fax: [phone]

## 2017-05-08 NOTE — Patient Instructions (Signed)

## 2017-06-16 ENCOUNTER — Ambulatory Visit (INDEPENDENT_AMBULATORY_CARE_PROVIDER_SITE_OTHER): Payer: 59 | Admitting: Internal Medicine

## 2017-06-16 ENCOUNTER — Encounter: Payer: Self-pay | Admitting: Internal Medicine

## 2017-06-16 VITALS — BP 130/80 | HR 86 | Temp 98.1°F | Wt 215.0 lb

## 2017-06-16 DIAGNOSIS — R3129 Other microscopic hematuria: Secondary | ICD-10-CM

## 2017-06-16 DIAGNOSIS — N3000 Acute cystitis without hematuria: Secondary | ICD-10-CM

## 2017-06-16 DIAGNOSIS — R82998 Other abnormal findings in urine: Secondary | ICD-10-CM

## 2017-06-16 DIAGNOSIS — R8299 Other abnormal findings in urine: Secondary | ICD-10-CM | POA: Diagnosis not present

## 2017-06-16 DIAGNOSIS — R300 Dysuria: Secondary | ICD-10-CM | POA: Diagnosis not present

## 2017-06-16 DIAGNOSIS — R350 Frequency of micturition: Secondary | ICD-10-CM

## 2017-06-16 LAB — POC URINALSYSI DIPSTICK (AUTOMATED)
BILIRUBIN UA: NEGATIVE
GLUCOSE UA: NEGATIVE
KETONES UA: NEGATIVE
Nitrite, UA: POSITIVE
PH UA: 6 (ref 5.0–8.0)
Spec Grav, UA: 1.025 (ref 1.010–1.025)
Urobilinogen, UA: 0.2 E.U./dL

## 2017-06-16 MED ORDER — CIPROFLOXACIN HCL 500 MG PO TABS: 500.0000 mg | ORAL_TABLET | Freq: Two times a day (BID) | ORAL | 0 refills | Status: DC

## 2017-06-16 NOTE — Patient Instructions (Signed)
Urinary Tract Infection, Adult °A urinary tract infection (UTI) is an infection of any part of the urinary tract. The urinary tract includes the: °· Kidneys. °· Ureters. °· Bladder. °· Urethra. ° °These organs make, store, and get rid of pee (urine) in the body. °Follow these instructions at home: °· Take over-the-counter and prescription medicines only as told by your doctor. °· If you were prescribed an antibiotic medicine, take it as told by your doctor. Do not stop taking the antibiotic even if you start to feel better. °· Avoid the following drinks: °? Alcohol. °? Caffeine. °? Tea. °? Carbonated drinks. °· Drink enough fluid to keep your pee clear or pale yellow. °· Keep all follow-up visits as told by your doctor. This is important. °· Make sure to: °? Empty your bladder often and completely. Do not to hold pee for long periods of time. °? Empty your bladder before and after sex. °? Wipe from front to back after a bowel movement if you are male. Use each tissue one time when you wipe. °Contact a doctor if: °· You have back pain. °· You have a fever. °· You feel sick to your stomach (nauseous). °· You throw up (vomit). °· Your symptoms do not get better after 3 days. °· Your symptoms go away and then come back. °Get help right away if: °· You have very bad back pain. °· You have very bad lower belly (abdominal) pain. °· You are throwing up and cannot keep down any medicines or water. °This information is not intended to replace advice given to you by your health care provider. Make sure you discuss any questions you have with your health care provider. °Document Released: 05/26/2008 Document Revised: 05/15/2016 Document Reviewed: 10/29/2015 °Elsevier Interactive Patient Education © 2018 Elsevier Inc. ° °

## 2017-06-16 NOTE — Addendum Note (Signed)
Addended by: Roena Malady on: 06/16/2017 02:16 PM   Modules accepted: Orders

## 2017-06-16 NOTE — Progress Notes (Signed)
HPI  Pt presents to the clinic today with c/o urinary frequency, dysuria and dark urine. He reports he noticed this 2-3 days ago. He has noticed some blood in his urine. He denies fever, chills, nausea or low back pain. He denies penile discharge. He has not taken anything OTC for this.   Review of Systems  Past Medical History:  Diagnosis Date  . Hyperlipidemia 07/1995  . Low testosterone    per Dr. Reuel Boom, with urology    Family History  Problem Relation Age of Onset  . Diabetes Mother        kidney removal  . Hypertension Mother   . Heart disease Brother        CABG, long time smoker, now quit, pacer/defib placement  . Colon polyps Brother   . Parkinsonism Father   . Prostate cancer Other   . Colon cancer Neg Hx     Social History   Social History  . Marital status: Married    Spouse name: N/A  . Number of children: N/A  . Years of education: N/A   Occupational History  . [employer] tech [employer]    Social History Main Topics  . Smoking status: Former Smoker    Packs/day: 2.00    Years: 5.00    Types: Cigarettes    Quit date: 13  . Smokeless tobacco: Former Neurosurgeon    Types: Chew    Quit date: 74  . Alcohol use 2.4 oz/week    2 Glasses of wine, 2 Cans of beer per week  . Drug use: No  . Sexual activity: Not on file   Other Topics Concern  . 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   GU: Pt reports dark urine, blood in urine, frequency and pain with urination. Denies burning sensation, blood in urine, odor or discharge.   No other specific complaints in a complete review of systems (except as listed in HPI above).    Objective:   Physical Exam  BP 130/80   Pulse 86   Temp 98.1 F (36.7 C) (Oral)   Wt 215 lb (97.5 kg)    SpO2 97%   BMI 27.60 kg/m   Wt Readings from Last 3 Encounters:  06/16/17 215 lb (97.5 kg)  05/08/17 218 lb (98.9 kg)  02/18/17 220 lb (99.8 kg)    General: Appears his stated age, well developed, well nourished in NAD. Abdomen: Soft. Normal bowel sounds. No distention or masses noted.  Tender to palpation over the bladder area. No CVA tenderness.       Assessment & Plan:   Dark Urine, Blood in Urine, Frequency, Dysuria secondary to UTI:  Urinalysis: 2+ leuks, + nitrites and 3+ blood Will send urine culture eRx sent if for Cipro 500 mg BID x 7 days OK to take AZO OTC Drink plenty of fluids  RTC as needed or if symptoms persist. Nicki Reaper, NP

## 2017-06-19 LAB — URINE CULTURE

## 2017-07-31 ENCOUNTER — Telehealth: Payer: Self-pay | Admitting: Family Medicine

## 2017-07-31 NOTE — Telephone Encounter (Signed)
Pt has appt to see Dr Ermalene Searing on 08/01/17 at 10:45 at [facility].

## 2017-07-31 NOTE — Telephone Encounter (Signed)
[facility] - Client TELEPHONE ADVICE RECORD   [facility]     Patient Name: Randy Pittman Initial Comment caller states husband on his left leg he has a bruise with a knot in it.   DOB: [birth year]      Nurse Assessment  Nurse: Karlene Lineman RN, Bonita Quin Date/Time (Eastern Time): 07/31/2017 3:55:07 PM  Confirm and document reason for call. If symptomatic, describe symptoms. ---Caller states husband has a bruise with a knot in it on his left leg . Inside of left leg 2 inches below the left knee. On blood thinner twice daily since January; stent placement in heart in January. Noticing a bruise in both areas on both calves, inside calf. One on the left has a knot on it. Not painful. Lump is size of thumb nail. Bruise is size of a small hand. No other symptoms. No SOB or chest pain.  Does the patient have any new or worsening symptoms? ---Yes  Will a triage be completed? ---Yes  Related visit to physician within the last 2 weeks? ---No  Does the PT have any chronic conditions? (i.e. diabetes, asthma, etc.) ---Yes  List chronic conditions. ---hx of cardiac stent placement  Is this a behavioral health or substance abuse call? ---No    Guidelines     Guideline Title Affirmed Question Affirmed Notes   Skin Lump or Localized Swelling [1] Swelling is painful to touch AND [2] no fever    Final Disposition User   See Physician within 24 Hours Kluth, RN, [employer] took address and number for [facility].   Referrals   [facility]   Disagree/Comply: Comply

## 2017-08-01 ENCOUNTER — Ambulatory Visit (INDEPENDENT_AMBULATORY_CARE_PROVIDER_SITE_OTHER): Payer: 59 | Admitting: Family Medicine

## 2017-08-01 DIAGNOSIS — T148XXA Other injury of unspecified body region, initial encounter: Secondary | ICD-10-CM | POA: Diagnosis not present

## 2017-08-01 NOTE — Patient Instructions (Signed)
No treatment needed. Will resolve over time.

## 2017-08-01 NOTE — Progress Notes (Signed)
   Subjective:    Patient ID: Randy Pittman, male    DOB: 20-Sep-1951, 66 y.o.   MRN: [id number]  HPI   66 year old male pt of Dr. Para March with history of CAD Presents with new onset bilateral leg bruising x 4-5 days.  He does not recall injury. He works as a Photographer so crawls a lot.  He noted bruising in in each calf at the same time, no soreness.  No swelling in ankles or legs. No fever.  No other bleeding.. No gum bleeding, no nose bleeds, no blood in stool.   No flu-like symptoms.  On ASA 81 mg daily on Brilinta twice daily. No NSAIDs Social History /Family History/Past Medical History reviewed in detail and updated in EMR if needed. Blood pressure 124/84, pulse 74, temperature 99 F (37.2 C), temperature source Oral, weight 214 lb 8 oz (97.3 kg), SpO2 97 %.  Review of Systems  Constitutional: Negative for fatigue and fever.  HENT: Negative for ear pain.   Eyes: Negative for pain.  Respiratory: Negative for cough and shortness of breath.   Cardiovascular: Negative for chest pain, palpitations and leg swelling.  Gastrointestinal: Negative for abdominal pain.  Genitourinary: Negative for dysuria.  Musculoskeletal: Negative for arthralgias.  Neurological: Negative for syncope, light-headedness and headaches.  Psychiatric/Behavioral: Negative for dysphoric mood.       Objective:   Physical Exam  Constitutional: Vital signs are normal. He appears well-developed and well-nourished.  HENT:  Head: Normocephalic.  Right Ear: Hearing normal.  Left Ear: Hearing normal.  Nose: Nose normal.  Mouth/Throat: Oropharynx is clear and moist and mucous membranes are normal.  Neck: Trachea normal. Carotid bruit is not present. No thyroid mass and no thyromegaly present.  Cardiovascular: Normal rate, regular rhythm and normal pulses.  Exam reveals no gallop, no distant heart sounds and no friction rub.   No murmur heard. No peripheral edema  Pulmonary/Chest: Effort normal  and breath sounds normal. No respiratory distress.  Skin: Skin is warm, dry and intact. No rash noted.  Resolving contusion on bilateral calves.. Left > right Left with associated mobile nodule shooter marble size, no tender.  Psychiatric: He has a normal mood and affect. His speech is normal and behavior is normal. Thought content normal.          Assessment & Plan:

## 2017-08-01 NOTE — Assessment & Plan Note (Signed)
Likely secondary to Brinlinta and asa use. Likely injury posterior calves with crawling activity he does at work. NO sign of red flags, no sign of life threatening bleed. No sign of infection.  pt reassured. Discussed time course for hematoma to resolve.

## 2017-08-02 ENCOUNTER — Other Ambulatory Visit: Payer: Self-pay | Admitting: Family Medicine

## 2017-08-02 DIAGNOSIS — E785 Hyperlipidemia, unspecified: Secondary | ICD-10-CM

## 2017-08-02 DIAGNOSIS — Z125 Encounter for screening for malignant neoplasm of prostate: Secondary | ICD-10-CM

## 2017-08-11 ENCOUNTER — Other Ambulatory Visit (INDEPENDENT_AMBULATORY_CARE_PROVIDER_SITE_OTHER): Payer: 59

## 2017-08-11 DIAGNOSIS — Z125 Encounter for screening for malignant neoplasm of prostate: Secondary | ICD-10-CM | POA: Diagnosis not present

## 2017-08-11 DIAGNOSIS — E785 Hyperlipidemia, unspecified: Secondary | ICD-10-CM | POA: Diagnosis not present

## 2017-08-11 LAB — BASIC METABOLIC PANEL
BUN: 15 mg/dL (ref 6–23)
CHLORIDE: 107 meq/L (ref 96–112)
CO2: 30 meq/L (ref 19–32)
CREATININE: 0.98 mg/dL (ref 0.40–1.50)
Calcium: 8.9 mg/dL (ref 8.4–10.5)
GFR: 81.75 mL/min (ref >60.00)
GLUCOSE: 106 mg/dL — AB (ref 70–99)
POTASSIUM: 4 meq/L (ref 3.5–5.1)
Sodium: 142 mEq/L (ref 135–145)

## 2017-08-11 LAB — PSA: PSA: 1.46 ng/mL (ref 0.10–4.00)

## 2017-08-12 ENCOUNTER — Other Ambulatory Visit: Payer: 59

## 2017-08-17 ENCOUNTER — Encounter: Payer: Self-pay | Admitting: Family Medicine

## 2017-08-17 ENCOUNTER — Ambulatory Visit (INDEPENDENT_AMBULATORY_CARE_PROVIDER_SITE_OTHER): Payer: 59 | Admitting: Family Medicine

## 2017-08-17 VITALS — BP 110/80 | HR 66 | Temp 98.6°F | Wt 215.5 lb

## 2017-08-17 DIAGNOSIS — Z8042 Family history of malignant neoplasm of prostate: Secondary | ICD-10-CM | POA: Diagnosis not present

## 2017-08-17 DIAGNOSIS — I2510 Atherosclerotic heart disease of native coronary artery without angina pectoris: Secondary | ICD-10-CM | POA: Diagnosis not present

## 2017-08-17 DIAGNOSIS — E785 Hyperlipidemia, unspecified: Secondary | ICD-10-CM | POA: Diagnosis not present

## 2017-08-17 DIAGNOSIS — Z0000 Encounter for general adult medical examination without abnormal findings: Secondary | ICD-10-CM

## 2017-08-17 NOTE — Patient Instructions (Addendum)
Check with your insurance to see if they will cover the shingrix shot. I would get a flu shot each fall.   Take care.  Glad to see you.  Update me as needed. 

## 2017-08-17 NOTE — Progress Notes (Signed)
CPE- See plan.  Routine anticipatory guidance given to patient.  See health maintenance.  The possibility exists that previously documented standard health maintenance information may have been brought forward from a previous encounter into this note.  If needed, that same information has been updated to reflect the current situation based on today's encounter.    Tetanus 2011  Flu done at work  PNA at 66  Shingles 2014  AAA screening not due. D/w pt.  Colonoscopy 2010  PSA wnl, FH noted.  Living will d/w pt. Would have his wife designated if patient were incapacitated.   Diet and exercise d/w pt. Doing well with both. Yard work and walking. Diet is good.  Working nights, doing well. No sig memory changes.  Still doing his job and functioning well.   [facility] Had a doughnut the day prior to labs.  Weight is not up.  D/w pt about avoiding sweets.    Elevated Cholesterol: Using medications without problems: yes Muscle aches: no Diet compliance:yes Exercise:yes Lipids prev controlled on check.    CAD s/p stent.  No CP, SOB, BLE edema.  Some bruising on the DAPT but able to tolerate o/w.    PMH and SH reviewed  Meds, vitals, and allergies reviewed.   ROS: Per HPI.  Unless specifically indicated otherwise in HPI, the patient denies:  General: fever. Eyes: acute vision changes ENT: sore throat Cardiovascular: chest pain Respiratory: SOB GI: vomiting GU: dysuria Musculoskeletal: acute back pain Derm: acute rash Neuro: acute motor dysfunction Psych: worsening mood Endocrine: polydipsia Heme: bleeding Allergy: hayfever  GEN: nad, alert and oriented HEENT: mucous membranes moist NECK: supple w/o LA CV: rrr. PULM: ctab, no inc wob ABD: soft, +bs EXT: no edema SKIN: no acute rash but minimal bruising noted on the L arm.

## 2017-08-17 NOTE — Assessment & Plan Note (Signed)
No CP, SOB, BLE edema.  Some bruising on the DAPT but able to tolerate o/w.  Continue as is.

## 2017-08-17 NOTE — Assessment & Plan Note (Signed)
Able to tolerate statin, continue as is.  Prev lipids at goal.

## 2017-08-17 NOTE — Assessment & Plan Note (Signed)
PSA wnl, d/w pt.

## 2017-08-17 NOTE — Assessment & Plan Note (Signed)
Tetanus 2011  Flu done at work  PNA at 26  Shingles 2014  AAA screening not due. D/w pt.  Colonoscopy 2010  PSA wnl, FH noted.  Living will d/w pt. Would have his wife designated if patient were incapacitated.   Diet and exercise d/w pt. Doing well with both. Yard work and walking. Diet is good.  Working nights, doing well. No sig memory changes.  Still doing his job and functioning well.   [facility] Had a doughnut the day prior to labs.  Weight is not up.  D/w pt about avoiding sweets.

## 2017-10-06 ENCOUNTER — Encounter: Payer: Self-pay | Admitting: Family Medicine

## 2017-11-09 ENCOUNTER — Ambulatory Visit (INDEPENDENT_AMBULATORY_CARE_PROVIDER_SITE_OTHER): Payer: 59 | Admitting: Physician Assistant

## 2017-11-09 ENCOUNTER — Encounter: Payer: Self-pay | Admitting: Physician Assistant

## 2017-11-09 VITALS — BP 130/92 | HR 79 | Ht 75.0 in | Wt 213.0 lb

## 2017-11-09 DIAGNOSIS — I493 Ventricular premature depolarization: Secondary | ICD-10-CM | POA: Diagnosis not present

## 2017-11-09 DIAGNOSIS — I255 Ischemic cardiomyopathy: Secondary | ICD-10-CM | POA: Diagnosis not present

## 2017-11-09 DIAGNOSIS — E785 Hyperlipidemia, unspecified: Secondary | ICD-10-CM | POA: Diagnosis not present

## 2017-11-09 DIAGNOSIS — I2510 Atherosclerotic heart disease of native coronary artery without angina pectoris: Secondary | ICD-10-CM

## 2017-11-09 LAB — LIPID PANEL
Chol/HDL Ratio: 2.1 ratio (ref 0.0–5.0)
Cholesterol, Total: 130 mg/dL (ref 100–199)
HDL: 61 mg/dL (ref >39)
LDL CALC: 58 mg/dL (ref 0–99)
Triglycerides: 57 mg/dL (ref 0–149)
VLDL CHOLESTEROL CAL: 11 mg/dL (ref 5–40)

## 2017-11-09 LAB — HEPATIC FUNCTION PANEL
ALK PHOS: 61 IU/L (ref 39–117)
ALT: 18 IU/L (ref 0–44)
AST: 17 IU/L (ref 0–40)
Albumin: 4.7 g/dL (ref 3.6–4.8)
Bilirubin Total: 0.7 mg/dL (ref 0.0–1.2)
Bilirubin, Direct: 0.2 mg/dL (ref 0.00–0.40)
TOTAL PROTEIN: 6.7 g/dL (ref 6.0–8.5)

## 2017-11-09 NOTE — Progress Notes (Signed)
Cardiology Office Note    Date:  11/09/2017   ID:  Randy Pittman, DOB August 01, 1951, MRN [id number]  PCP:  Joaquim Nam, MD  Cardiologist: Dr. Delton See  Chief Complaint  Patient presents with  . Follow-up    CAD/Seen for Dr. Delton See    History of Present Illness:  Randy Pittman is a 66 y.o. male with history of CAD after undergoing lifestyle screening and noted to have PVCs.  He was asymptomatic.  His twin brother has significant ischemic cardiomyopathy.  Abnormal nuclear stress test 12/27/15 showed moderate appearing inferior wall ischemia from the apex to the base and diffuse hypokinesis is worse in the apex EF 43%.  Left heart cath showed significant 2 vessel CAD with subtotal 99% OM1 and 65% mid RCA and total occlusion of the PLA with collaterals he had DES to the circumflex marginal and RCA may need stenting in the future if it progresses.  He had mild LV dysfunction ejection fraction 50%.  Also has hypertension and HLD.  Last seen by Dr. Delton See 04/2017 and doing well.  Patient comes in today for follow-up accompanied by his wife.  He works 12-hour days and does yard work but does not get any regular activity.  He denies any chest pain, palpitations, dyspnea, dyspnea on exertion, dizziness or presyncope.  He is concerned because his twin brother continues to decline and has a defibrillator.  Patient has never had any cardiac symptoms.  Blood pressure is up today but he checks it regularly at work and at home and it is always normal.    Past Medical History:  Diagnosis Date  . CAD (coronary artery disease)   . Hyperlipidemia 07/1995  . Low testosterone    per Dr. Reuel Boom, with urology    Past Surgical History:  Procedure Laterality Date  . COLONOSCOPY    . CORONARY ANGIOPLASTY WITH STENT PLACEMENT  01/01/2017  . Coronary Stent Intervention N/A 01/01/2017   Performed by Lennette Bihari, MD at [facility] INVASIVE CV LAB  . Left Heart Cath and Coronary Angiography N/A 01/01/2017   Performed by Lennette Bihari, MD at [facility] INVASIVE CV LAB    Current Medications: Current Meds  Medication Sig  . aspirin 81 MG tablet Take 81 mg by mouth daily.    Marland Kitchen atorvastatin (LIPITOR) 80 MG tablet TAKE 1 TABLET BY MOUTH  DAILY AT 6 PM.  . cholecalciferol (VITAMIN D) 1000 UNITS tablet Take 1,000 Units by mouth daily.  Marland Kitchen losartan (COZAAR) 25 MG tablet Take 0.5 tablets (12.5 mg total) by mouth daily.  . metoprolol succinate (TOPROL-XL) 25 MG 24 hr tablet Take 1 tablet (25 mg total) by mouth every evening.  . nitroGLYCERIN (NITROSTAT) 0.4 MG SL tablet Place 1 tablet (0.4 mg total) under the tongue every 5 (five) minutes as needed for chest pain.  . ticagrelor (BRILINTA) 90 MG TABS tablet Take 1 tablet (90 mg total) by mouth 2 (two) times daily.  . vitamin B-12 (CYANOCOBALAMIN) 500 MCG tablet Take 500 mcg by mouth daily.     Allergies:   Patient has no known allergies.   Social History   Socioeconomic History  . Marital status: Married    Spouse name: None  . Number of children: None  . Years of education: None  . Highest education level: None  Social Needs  . Financial resource strain: None  . Food insecurity - worry: None  . Food insecurity - inability: None  . Transportation needs - medical: None  .  Transportation needs - non-medical: None  Occupational History  . Occupation: Scientific laboratory technician [employer]  Tobacco Use  . Smoking status: Former Smoker    Packs/day: 2.00    Years: 5.00    Pack years: 10.00    Types: Cigarettes    Last attempt to quit: 1980    Years since quitting: 38.9  . Smokeless tobacco: Former Neurosurgeon    Types: Chew    Quit date: 1975  Substance and Sexual Activity  . Alcohol use: Yes    Alcohol/week: 2.4 oz    Types: 2 Glasses of wine, 2 Cans of beer per week  . Drug use: No  . Sexual activity: None  Other Topics Concern  . 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   ROS:   Please see the history of present illness.    Review of Systems  Constitution: Negative.  HENT: Negative.   Cardiovascular: Negative.   Respiratory: Negative.   Endocrine: Negative.   Hematologic/Lymphatic: Negative.   Musculoskeletal: Negative.   Gastrointestinal: Negative.   Genitourinary: Negative.   Neurological: Negative.    All other systems reviewed and are negative.   PHYSICAL EXAM:   VS:  BP (!) 130/92   Pulse 79   Ht 6\' 3"  (1.905 m)   Wt 213 lb (96.6 kg)   SpO2 99%   BMI 26.62 kg/m   Physical Exam  GEN: Well nourished, well developed, in no acute distress  Neck: no JVD, carotid bruits, or masses Cardiac:RRR; no murmurs, rubs, or gallops  Respiratory:  clear to auscultation bilaterally, normal work of breathing GI: soft, nontender, nondistended, + BS Ext: without cyanosis, clubbing, or edema, Good distal pulses bilaterally Neuro:  Alert and Oriented x 3 Psych: euthymic mood, full affect  Wt Readings from Last 3 Encounters:  11/09/17 213 lb (96.6 kg)  08/17/17 215 lb 8 oz (97.8 kg)  08/01/17 214 lb 8 oz (97.3 kg)      Studies/Labs Reviewed:   EKG:  EKG is not ordered today.   Recent Labs: 12/05/2016: TSH 1.10 01/02/2017: Hemoglobin 13.5; Platelets 151 05/01/2017: ALT 18 08/11/2017: BUN 15; Creatinine, Ser 0.98; Potassium 4.0; Sodium 142   Lipid Panel    Component Value Date/Time   CHOL 133 05/01/2017 0804   TRIG 60 05/01/2017 0804   HDL 54 05/01/2017 0804   CHOLHDL 2.5 05/01/2017 0804   CHOLHDL 3 08/01/2016 0816   VLDL 19.2 08/01/2016 0816   LDLCALC 67 05/01/2017 0804   LDLDIRECT 169.4 07/09/2012 1021    Additional studies/ records that were reviewed today  include:   Cardiac catheterization 01/01/17 Procedures    Coronary Stent Intervention  Left Heart Cath and Coronary Angiography  Conclusion       There is mild left ventricular systolic dysfunction.  Dist RCA lesion, 100 %stenosed.  Mid RCA lesion, 65 %stenosed.  A STENT SYNERGY DES 2.5X28 drug eluting stent was successfully placed.  Lat 1st Mrg lesion, 99 %stenosed.  Post intervention, there is a 0% residual stenosis.   Low-normal to mild LV dysfunction with an ejection fraction of 50%  with a small focal region of distal inferior hypocontractility.   Significant 2 vessel coronary obstructive disease with a subtotal 99 100% stenosis involving the inferior branch of the obtuse marginal 1 vessel; and 65% mid RCA stenosis proximal to the acute margin with total occlusion of the PLA with retrograde collateralization to the distal PLA via the AV groove circumflex branches.  LAD with mild luminal irregularity.   Successful PCI to the circumflex marginal vessel with ultimate insertion of a Synergy DES 2.528 mm stent postdilated 2.75 mm with the 99% stenosis reduced to 0% and resumption of brisk TIMI-3 flow    2-D echo 12/26/16   Study Conclusions   - Left ventricle: The cavity size was normal. Wall thickness was   increased in a pattern of mild LVH. Systolic function was mildly   to moderately reduced. The estimated ejection fraction was in the   range of 40% to 45%. Moderate hypokinesis of the   mid-apicalinferoseptal myocardium. Doppler parameters are   consistent with abnormal left ventricular relaxation (grade 1   diastolic dysfunction). - Aortic valve: There was trivial regurgitation. - Right atrium: The atrium was mildly dilated.   TTE: 05/01/2017 - Left ventricle: The cavity size was normal. Wall thickness was   increased in a pattern of mild LVH. There was moderate concentric   hypertrophy. Systolic function was normal. The estimated ejection   fraction was in the range of  50% to 55%. Diffuse hypokinesis.   Doppler parameters are consistent with abnormal left ventricular   relaxation (grade 1 diastolic dysfunction). - Aortic valve: There was mild regurgitation. - Aorta: Ascending aortic diameter: 42 mm (S). - Ascending aorta: The ascending aorta was mildly dilated. - Mitral valve: Calcified annulus. Mildly thickened, mildly   calcified leaflets . - Left atrium: The atrium was mildly dilated.   Nuclear stress test 12/26/16 Study Highlights     Nuclear stress EF: 43%.  Findings consistent with ischemia.  This is an intermediate risk study.  The left ventricular ejection fraction is moderately decreased (30-44%).  Upsloping ST segment depression ST segment depression of 1 mm was noted during stress in the I, aVL, V5 and V6 leads.   Moderate appearing inferior wall ischemia from apex to base Diffuse hypokinesis worse in apex EF 43%           ASSESSMENT:    1. CAD in native artery   2. Cardiomyopathy, ischemic   3. PVC's (premature ventricular contractions)   4. Left ventricular dysfunction   5. Hyperlipidemia, unspecified hyperlipidemia type      PLAN:  In order of problems listed above:  CAD status post DES to the circumflex marginal with residual RCA disease 12/2016.  Patient was asymptomatic prior to this but had a lot of PVCs on lifestyle screening that led to an echo and stress test that was abnormal.  We will follow-up stress test next month.  Follow-up with Dr. Delton SeeNelson next available.  Ischemic cardiomyopathy ejection fraction improved to 50-55% on limited echo 05/01/17.  No heart failure symptoms.  PVCs stable on metoprolol  Hyperlipidemia on Lipitor check fasting lipid panel and LFTs today.  Medication Adjustments/Labs and Tests Ordered: Current medicines are reviewed at length with the patient today.  Concerns regarding medicines are outlined above.  Medication changes, Labs and Tests ordered today are listed in the Patient  Instructions below. There are no Patient Instructions on file for this visit.   Signed, Jacolyn ReedyMichele Lenze, PA-C  11/09/2017 9:15 AM    [facility] [address], [place], [place]  [address] Phone: [phone]; Fax: [phone]

## 2017-11-09 NOTE — Patient Instructions (Addendum)
Medication Instructions:  Your physician recommends that you continue on your current medications as directed. Please refer to the Current Medication list given to you today.   Labwork: TODAY:  LIPID & LFT  Testing/Procedures: Your physician has requested that you have en exercise stress myoview. For further information please visit [email or web address] Please follow instruction sheet, as given.    Follow-Up: Your physician recommends that you schedule a follow-up appointment in: 1 ST AVAILABLE WITH DR. Delton See   Any Other Special Instructions Will Be Listed Below (If Applicable).   If you need a refill on your cardiac medications before your next appointment, please call your pharmacy.

## 2017-11-10 ENCOUNTER — Telehealth (HOSPITAL_COMMUNITY): Payer: Self-pay | Admitting: Radiology

## 2017-11-10 ENCOUNTER — Telehealth (HOSPITAL_COMMUNITY): Payer: Self-pay | Admitting: *Deleted

## 2017-11-10 NOTE — Telephone Encounter (Signed)
Patient given detailed instructions per Myocardial Perfusion Study Information Sheet for the test on 11/17/2017 at 7:30. Patient notified to arrive 15 minutes early and that it is imperative to arrive on time for appointment to keep from having the test rescheduled.  If you need to cancel or reschedule your appointment, please call the office within 24 hours of your appointment. . Patient verbalized understanding.EHK

## 2017-11-10 NOTE — Telephone Encounter (Signed)
Left message on voicemail in reference to upcoming appointment scheduled for 11/17/17. Phone number given for a call back so details instructions can be given. Derron Pipkins W   

## 2017-11-17 ENCOUNTER — Ambulatory Visit (HOSPITAL_COMMUNITY): Payer: 59 | Attending: Cardiology

## 2017-11-17 DIAGNOSIS — I2510 Atherosclerotic heart disease of native coronary artery without angina pectoris: Secondary | ICD-10-CM | POA: Diagnosis not present

## 2017-11-17 DIAGNOSIS — R002 Palpitations: Secondary | ICD-10-CM | POA: Diagnosis not present

## 2017-11-17 DIAGNOSIS — I255 Ischemic cardiomyopathy: Secondary | ICD-10-CM

## 2017-11-17 DIAGNOSIS — R9439 Abnormal result of other cardiovascular function study: Secondary | ICD-10-CM | POA: Diagnosis not present

## 2017-11-17 DIAGNOSIS — Z8249 Family history of ischemic heart disease and other diseases of the circulatory system: Secondary | ICD-10-CM | POA: Diagnosis not present

## 2017-11-17 DIAGNOSIS — I10 Essential (primary) hypertension: Secondary | ICD-10-CM | POA: Diagnosis not present

## 2017-11-17 LAB — MYOCARDIAL PERFUSION IMAGING
CHL CUP NUCLEAR SRS: 9
CHL CUP NUCLEAR SSS: 10
CHL CUP RESTING HR STRESS: 88 {beats}/min
CSEPED: 9 min
CSEPEDS: 0 s
CSEPEW: 10.1 METS
CSEPHR: 94 %
LV dias vol: 157 mL (ref 62–150)
LV sys vol: 85 mL
MPHR: 156 {beats}/min
Peak HR: 148 {beats}/min
RATE: 0.27
SDS: 1
TID: 0.96

## 2017-11-17 MED ORDER — TECHNETIUM TC 99M TETROFOSMIN IV KIT
10.5000 | PACK | Freq: Once | INTRAVENOUS | Status: AC | PRN
  Administered 2017-11-17: 10.5 via INTRAVENOUS
  Filled 2017-11-17: qty 11

## 2017-11-17 MED ORDER — TECHNETIUM TC 99M TETROFOSMIN IV KIT
31.4000 | PACK | Freq: Once | INTRAVENOUS | Status: AC | PRN
  Administered 2017-11-17: 31.4 via INTRAVENOUS
  Filled 2017-11-17: qty 32

## 2017-12-09 ENCOUNTER — Encounter: Payer: Self-pay | Admitting: Cardiology

## 2017-12-09 ENCOUNTER — Ambulatory Visit (INDEPENDENT_AMBULATORY_CARE_PROVIDER_SITE_OTHER): Payer: 59 | Admitting: Cardiology

## 2017-12-09 VITALS — BP 132/84 | HR 78 | Resp 16 | Ht 75.0 in | Wt 214.4 lb

## 2017-12-09 DIAGNOSIS — I77810 Thoracic aortic ectasia: Secondary | ICD-10-CM | POA: Insufficient documentation

## 2017-12-09 DIAGNOSIS — I493 Ventricular premature depolarization: Secondary | ICD-10-CM

## 2017-12-09 DIAGNOSIS — I2510 Atherosclerotic heart disease of native coronary artery without angina pectoris: Secondary | ICD-10-CM

## 2017-12-09 DIAGNOSIS — E782 Mixed hyperlipidemia: Secondary | ICD-10-CM | POA: Diagnosis not present

## 2017-12-09 DIAGNOSIS — I10 Essential (primary) hypertension: Secondary | ICD-10-CM

## 2017-12-09 DIAGNOSIS — I7121 Aneurysm of the ascending aorta, without rupture: Secondary | ICD-10-CM

## 2017-12-09 DIAGNOSIS — I712 Thoracic aortic aneurysm, without rupture: Secondary | ICD-10-CM

## 2017-12-09 DIAGNOSIS — E785 Hyperlipidemia, unspecified: Secondary | ICD-10-CM | POA: Diagnosis not present

## 2017-12-09 NOTE — Patient Instructions (Signed)
Medication Instructions:   Your physician recommends that you continue on your current medications as directed. Please refer to the Current Medication list given to you today.    Testing/Procedures:  Your physician has requested that you have an echocardiogram. Echocardiography is a painless test that uses sound waves to create images of your heart. It provides your doctor with information about the size and shape of your heart and how well your heart's chambers and valves are working. This procedure takes approximately one hour. There are no restrictions for this procedure. PLEASE SCHEDULE THIS A WEEK PRIOR TO YOUR 6 MONTH FOLLOW-UP APPOINTMENT WITH DR Delton See    Follow-Up:  Your physician wants you to follow-up in: 6 MONTHS WITH DR Johnell Comings will receive a reminder letter in the mail two months in advance. If you don't receive a letter, please call our office to schedule the follow-up appointment.        If you need a refill on your cardiac medications before your next appointment, please call your pharmacy.

## 2017-12-09 NOTE — Progress Notes (Signed)
Cardiology Office Note    Date:  12/09/2017   ID:  Randy Pittman, DOB 09/24/1951, MRN [id number]  PCP:  Joaquim Nam, MD  Cardiologist: Dr. Delton See  Chief Complaint  Patient presents with  . Follow-up    stress test     History of Present Illness:  Randy Pittman is a 66 y.o. male with history of CAD after undergoing lifestyle screening and noted to have PVCs.  He was asymptomatic.  His twin brother has significant ischemic cardiomyopathy.  Abnormal nuclear stress test 12/27/15 showed moderate appearing inferior wall ischemia from the apex to the base and diffuse hypokinesis is worse in the apex EF 43%.  Left heart cath showed significant 2 vessel CAD with subtotal 99% OM1 and 65% mid RCA and total occlusion of the PLA with collaterals he had DES to the circumflex marginal and RCA may need stenting in the future if it progresses.  He had mild LV dysfunction ejection fraction 50%.  Also has hypertension and HLD.  Last seen by Dr. Delton See 04/2017 and doing well.  11/09/17 - Patient comes in today for follow-up accompanied by his wife.  He works 12-hour days and does yard work but does not get any regular activity.  He denies any chest pain, palpitations, dyspnea, dyspnea on exertion, dizziness or presyncope.  He is concerned because his twin brother continues to decline and has a defibrillator.  Patient has never had any cardiac symptoms.  Blood pressure is up today but he checks it regularly at work and at home and it is always normal.  12/09/2017 - the patient is coming after 1 month, he states that he is free of dyspnea or shortness of breath. He works 12 hour shifts, at night where he walks a lot and has no symptoms. He also has no lower extremity edema orthopnea or proximal nocturnal dyspnea. He has no bleeding only mild bruising with dual antiplatelet therapy, his concern with the new house plant that he is getting in January his Marden Noble might be more expensive. He has no side effects  with atorvastatin.   Past Medical History:  Diagnosis Date  . CAD (coronary artery disease)   . Hyperlipidemia 07/1995  . Low testosterone    per Dr. Reuel Boom, with urology    Past Surgical History:  Procedure Laterality Date  . CARDIAC CATHETERIZATION N/A 01/01/2017   Procedure: Left Heart Cath and Coronary Angiography;  Surgeon: Lennette Bihari, MD;  Location: [facility] INVASIVE CV LAB;  Service: Cardiovascular;  Laterality: N/A;  . CARDIAC CATHETERIZATION N/A 01/01/2017   Procedure: Coronary Stent Intervention;  Surgeon: Lennette Bihari, MD;  Location: [facility] INVASIVE CV LAB;  Service: Cardiovascular;  Laterality: N/A;  . COLONOSCOPY    . CORONARY ANGIOPLASTY WITH STENT PLACEMENT  01/01/2017    Current Medications: Current Meds  Medication Sig  . aspirin 81 MG tablet Take 81 mg by mouth daily.    Marland Kitchen atorvastatin (LIPITOR) 80 MG tablet TAKE 1 TABLET BY MOUTH  DAILY AT 6 PM.  . cholecalciferol (VITAMIN D) 1000 UNITS tablet Take 1,000 Units by mouth daily.  Marland Kitchen losartan (COZAAR) 25 MG tablet Take 0.5 tablets (12.5 mg total) by mouth daily.  . metoprolol succinate (TOPROL-XL) 25 MG 24 hr tablet Take 1 tablet (25 mg total) by mouth every evening.  . nitroGLYCERIN (NITROSTAT) 0.4 MG SL tablet Place 1 tablet (0.4 mg total) under the tongue every 5 (five) minutes as needed for chest pain.  . ticagrelor (BRILINTA) 90  MG TABS tablet Take 1 tablet (90 mg total) by mouth 2 (two) times daily.  . vitamin B-12 (CYANOCOBALAMIN) 500 MCG tablet Take 500 mcg by mouth daily.     Allergies:   Patient has no known allergies.   Social History   Socioeconomic History  . Marital status: Married    Spouse name: None  . Number of children: None  . Years of education: None  . Highest education level: None  Social Needs  . Financial resource strain: None  . Food insecurity - worry: None  . Food insecurity - inability: None  . Transportation needs - medical: None  . Transportation needs - non-medical: None    Occupational History  . Occupation: Scientific laboratory technicianqot tech [employer]  Tobacco Use  . Smoking status: Former Smoker    Packs/day: 2.00    Years: 5.00    Pack years: 10.00    Types: Cigarettes    Last attempt to quit: 1980    Years since quitting: 38.9  . Smokeless tobacco: Former NeurosurgeonUser    Types: Chew    Quit date: 1975  Substance and Sexual Activity  . Alcohol use: Yes    Alcohol/week: 2.4 oz    Types: 2 Glasses of wine, 2 Cans of beer per week  . Drug use: No  . Sexual activity: None  Other Topics Concern  . 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   ROS:   Please see the history of present illness.    Review of Systems  Constitution: Negative.  HENT: Negative.   Cardiovascular: Negative.   Respiratory: Negative.   Endocrine: Negative.   Hematologic/Lymphatic: Negative.   Musculoskeletal: Negative.   Gastrointestinal: Negative.   Genitourinary: Negative.   Neurological: Negative.    All other systems reviewed and are negative.   PHYSICAL EXAM:   VS:  BP 132/84   Pulse 78   Resp 16   Ht 6\' 3"  (1.905 m)   Wt 214 lb 6.4 oz (97.3 kg)   SpO2 98%   BMI 26.80 kg/m   Physical Exam  GEN: Well nourished, well developed, in no acute distress  Neck: no JVD, carotid bruits, or masses Cardiac:RRR; no murmurs, rubs, or gallops  Respiratory:  clear to auscultation bilaterally, normal work of breathing GI: soft, nontender, nondistended, + BS Ext: without cyanosis, clubbing, or edema, Good distal pulses bilaterally Neuro:  Alert and Oriented x 3 Psych: euthymic mood, full affect  Wt Readings from Last 3 Encounters:   12/09/17 214 lb 6.4 oz (97.3 kg)  11/17/17 213 lb (96.6 kg)  11/09/17 213 lb (96.6 kg)      Studies/Labs Reviewed:   EKG:  EKG is ordered today.  It shows normal sinus rhythm with left axis deviation, inferior infarct age undetermined, this is unchanged from prior and personally reviewed.  Recent Labs: 01/02/2017: Hemoglobin 13.5; Platelets 151 08/11/2017: BUN 15; Creatinine, Ser 0.98; Potassium 4.0; Sodium 142 11/09/2017: ALT 18   Lipid Panel    Component Value Date/Time   CHOL 130 11/09/2017 0939   TRIG 57 11/09/2017 0939   HDL  61 11/09/2017 0939   CHOLHDL 2.1 11/09/2017 0939   CHOLHDL 3 08/01/2016 0816   VLDL 19.2 08/01/2016 0816   LDLCALC 58 11/09/2017 0939   LDLDIRECT 169.4 07/09/2012 1021    Additional studies/ records that were reviewed today include:   Cardiac catheterization 01/01/17 Procedures    Coronary Stent Intervention  Left Heart Cath and Coronary Angiography  Conclusion       There is mild left ventricular systolic dysfunction.  Dist RCA lesion, 100 %stenosed.  Mid RCA lesion, 65 %stenosed.  A STENT SYNERGY DES 2.5X28 drug eluting stent was successfully placed.  Lat 1st Mrg lesion, 99 %stenosed.  Post intervention, there is a 0% residual stenosis.   Low-normal to mild LV dysfunction with an ejection fraction of 50% with a small focal region of distal inferior hypocontractility.   Significant 2 vessel coronary obstructive disease with a subtotal 99 100% stenosis involving the inferior branch of the obtuse marginal 1 vessel; and 65% mid RCA stenosis proximal to the acute margin with total occlusion of the PLA with retrograde collateralization to the distal PLA via the AV groove circumflex branches.  LAD with mild luminal irregularity.   Successful PCI to the circumflex marginal vessel with ultimate insertion of a Synergy DES 2.528 mm stent postdilated 2.75 mm with the 99% stenosis reduced to 0% and resumption of brisk TIMI-3 flow    2-D echo  12/26/16   Study Conclusions   - Left ventricle: The cavity size was normal. Wall thickness was   increased in a pattern of mild LVH. Systolic function was mildly   to moderately reduced. The estimated ejection fraction was in the   range of 40% to 45%. Moderate hypokinesis of the   mid-apicalinferoseptal myocardium. Doppler parameters are   consistent with abnormal left ventricular relaxation (grade 1   diastolic dysfunction). - Aortic valve: There was trivial regurgitation. - Right atrium: The atrium was mildly dilated.   TTE: 05/01/2017 - Left ventricle: The cavity size was normal. Wall thickness was   increased in a pattern of mild LVH. There was moderate concentric   hypertrophy. Systolic function was normal. The estimated ejection   fraction was in the range of 50% to 55%. Diffuse hypokinesis.   Doppler parameters are consistent with abnormal left ventricular   relaxation (grade 1 diastolic dysfunction). - Aortic valve: There was mild regurgitation. - Aorta: Ascending aortic diameter: 42 mm (S). - Ascending aorta: The ascending aorta was mildly dilated. - Mitral valve: Calcified annulus. Mildly thickened, mildly   calcified leaflets . - Left atrium: The atrium was mildly dilated.   Nuclear stress test 12/26/16 Study Highlights     Nuclear stress EF: 43%.  Findings consistent with ischemia.  This is an intermediate risk study.  The left ventricular ejection fraction is moderately decreased (30-44%).  Upsloping ST segment depression ST segment depression of 1 mm was noted during stress in the I, aVL, V5 and V6 leads.   Moderate appearing inferior wall ischemia from apex to base Diffuse hypokinesis worse in apex EF 43%        ASSESSMENT:    1. CAD in native artery   2. Essential hypertension   3. Hyperlipidemia, unspecified hyperlipidemia type   4. Ascending aortic aneurysm (HCC)   5. PVC's (premature ventricular contractions)   6. Mixed hyperlipidemia    PLAN:    In order of problems listed above:  1. CAD status post DES to the circumflex marginal with residual RCA disease 12/2016.  Patient was  asymptomatic prior to this but had a lot of PVCs on lifestyle screening that led to an echo and stress test that was abnormal.   I have reviewed his nuclear stress test from 11/17/2017 there is a scar in the inferior wall however no ischemia. The patient is asymptomatic we will continue medical management. He has very significant family history of premature coronary artery disease including his twin brother who has low EF and internal defibrillator. We'll continue Toprol-XL, losartan atorvastatin and dual antiplatelet therapy with aspirin and Brilinta. If cost becomes a concern, he would be okay to discontinue Brilinta after January 2019.  2. Ischemic cardiomyopathy ejection fraction improved to 50-55% on limited echo 05/01/17.  No heart failure symptoms. We will continue Toprol-XL and losartan.  3. 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 Hyperlipidemia on Lipitor - all lipids at goal and normal LFTs.  Medication Adjustments/Labs and Tests Ordered: Current medicines are reviewed at length with the patient today.  Concerns regarding medicines are outlined above.  Medication changes, Labs and Tests ordered today are listed in the Patient Instructions below. Patient Instructions  Medication Instructions:   Your physician recommends that you continue on your current medications as directed. Please refer to the Current Medication list given to you today.    Testing/Procedures:  Your physician has requested that you have an echocardiogram. Echocardiography is a painless test that uses sound waves to create images of your heart. It provides your doctor with information about the size and shape of your heart and how well your heart's chambers and valves are working. This procedure takes approximately one hour. There are no restrictions for this procedure. PLEASE  SCHEDULE THIS A WEEK PRIOR TO YOUR 6 MONTH FOLLOW-UP APPOINTMENT WITH DR Delton See    Follow-Up:  Your physician wants you to follow-up in: 6 MONTHS WITH DR Johnell Comings will receive a reminder letter in the mail two months in advance. If you don't receive a letter, please call our office to schedule the follow-up appointment.        If you need a refill on your cardiac medications before your next appointment, please call your pharmacy.      Signed, Tobias Alexander, MD  12/09/2017 3:49 PM    [facility] [address] [place], [place], [place]  [address] Phone: [phone]; Fax: [phone]

## 2018-02-08 ENCOUNTER — Encounter: Payer: Self-pay | Admitting: Family Medicine

## 2018-02-08 ENCOUNTER — Ambulatory Visit (INDEPENDENT_AMBULATORY_CARE_PROVIDER_SITE_OTHER): Payer: 59 | Admitting: Family Medicine

## 2018-02-08 VITALS — BP 142/88 | HR 85 | Temp 98.6°F | Wt 214.5 lb

## 2018-02-08 DIAGNOSIS — R413 Other amnesia: Secondary | ICD-10-CM

## 2018-02-08 LAB — CBC WITH DIFFERENTIAL/PLATELET
Basophils Absolute: 0 10*3/uL (ref 0.0–0.1)
Basophils Relative: 0.6 % (ref 0.0–3.0)
EOS PCT: 2.5 % (ref 0.0–5.0)
Eosinophils Absolute: 0.2 10*3/uL (ref 0.0–0.7)
HCT: 45.8 % (ref 39.0–52.0)
HEMOGLOBIN: 15.4 g/dL (ref 13.0–17.0)
Lymphocytes Relative: 14.2 % (ref 12.0–46.0)
Lymphs Abs: 1 10*3/uL (ref 0.7–4.0)
MCHC: 33.7 g/dL (ref 30.0–36.0)
MCV: 88.5 fl (ref 78.0–100.0)
MONO ABS: 0.7 10*3/uL (ref 0.1–1.0)
MONOS PCT: 9.6 % (ref 3.0–12.0)
Neutro Abs: 5.1 10*3/uL (ref 1.4–7.7)
Neutrophils Relative %: 73.1 % (ref 43.0–77.0)
Platelets: 196 10*3/uL (ref 150.0–400.0)
RBC: 5.18 Mil/uL (ref 4.22–5.81)
RDW: 13.7 % (ref 11.5–15.5)
WBC: 6.9 10*3/uL (ref 4.0–10.5)

## 2018-02-08 LAB — COMPREHENSIVE METABOLIC PANEL
ALBUMIN: 4.4 g/dL (ref 3.5–5.2)
ALK PHOS: 52 U/L (ref 39–117)
ALT: 16 U/L (ref 0–53)
AST: 15 U/L (ref 0–37)
BILIRUBIN TOTAL: 1 mg/dL (ref 0.2–1.2)
BUN: 15 mg/dL (ref 6–23)
CO2: 29 mEq/L (ref 19–32)
CREATININE: 1.07 mg/dL (ref 0.40–1.50)
Calcium: 9.2 mg/dL (ref 8.4–10.5)
Chloride: 105 mEq/L (ref 96–112)
GFR: 73.75 mL/min (ref >60.00)
Glucose, Bld: 97 mg/dL (ref 70–99)
Potassium: 4.4 mEq/L (ref 3.5–5.1)
Sodium: 142 mEq/L (ref 135–145)
TOTAL PROTEIN: 7.1 g/dL (ref 6.0–8.3)

## 2018-02-08 LAB — TSH: TSH: 1.08 u[IU]/mL (ref 0.35–4.50)

## 2018-02-08 LAB — VITAMIN B12: Vitamin B-12: 841 pg/mL (ref 211–911)
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
## 2018-02-08 NOTE — Patient Instructions (Addendum)
Check with the pharmacy about the losartan recall.   Stop the losartan in the meantime, check your BP and see how you feel.  I'll check with cardiology in the meantime.  Take care.  Glad to see you.  Go to the lab on the way out.  We'll contact you with your lab report.

## 2018-02-10 ENCOUNTER — Telehealth: Payer: Self-pay | Admitting: Family Medicine

## 2018-02-10 NOTE — Telephone Encounter (Signed)
-----   Message from [name], MD sent at 02/10/2018  5:20 PM EST ----- I agree with you, he has CAD and h/o low LVEF so ideally he should be on both, but would rather keep metoprolol and d/c losartan if hypotension ongoing. Aris Lot   ----- Message ----- From: Joaquim Nam, MD Sent: 02/10/2018   4:54 PM To: [name], MD  Patient may have overtreated blood pressure.  He occasionally gets a little lightheaded.  There is a question about his relatively low blood pressure affecting his memory and concentration.  I had him hold his losartan in the meantime, for short period of time, and he will update me about how he feels.  I wanted to let you know.  I would appreciate any input that you have.  Thanks.   Clelia Croft
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
## 2018-02-19 ENCOUNTER — Encounter: Payer: Self-pay | Admitting: Family Medicine

## 2018-02-28 ENCOUNTER — Encounter: Payer: Self-pay | Admitting: Family Medicine

## 2018-05-06 ENCOUNTER — Other Ambulatory Visit: Payer: Self-pay | Admitting: Cardiology

## 2018-05-15 ENCOUNTER — Other Ambulatory Visit: Payer: Self-pay | Admitting: Cardiology

## 2018-06-02 ENCOUNTER — Ambulatory Visit (HOSPITAL_COMMUNITY): Payer: 59 | Attending: Cardiology

## 2018-06-02 ENCOUNTER — Other Ambulatory Visit: Payer: Self-pay

## 2018-06-02 DIAGNOSIS — E785 Hyperlipidemia, unspecified: Secondary | ICD-10-CM

## 2018-06-02 DIAGNOSIS — I2510 Atherosclerotic heart disease of native coronary artery without angina pectoris: Secondary | ICD-10-CM | POA: Diagnosis not present

## 2018-06-02 DIAGNOSIS — I351 Nonrheumatic aortic (valve) insufficiency: Secondary | ICD-10-CM | POA: Insufficient documentation

## 2018-06-02 DIAGNOSIS — I7121 Aneurysm of the ascending aorta, without rupture: Secondary | ICD-10-CM

## 2018-06-02 DIAGNOSIS — I712 Thoracic aortic aneurysm, without rupture: Secondary | ICD-10-CM | POA: Diagnosis not present

## 2018-06-02 DIAGNOSIS — I10 Essential (primary) hypertension: Secondary | ICD-10-CM | POA: Diagnosis not present

## 2018-06-03 ENCOUNTER — Telehealth: Payer: Self-pay | Admitting: *Deleted

## 2018-06-03 DIAGNOSIS — E782 Mixed hyperlipidemia: Secondary | ICD-10-CM

## 2018-06-03 DIAGNOSIS — I712 Thoracic aortic aneurysm, without rupture: Secondary | ICD-10-CM

## 2018-06-03 DIAGNOSIS — Z8249 Family history of ischemic heart disease and other diseases of the circulatory system: Secondary | ICD-10-CM

## 2018-06-03 DIAGNOSIS — I2510 Atherosclerotic heart disease of native coronary artery without angina pectoris: Secondary | ICD-10-CM

## 2018-06-03 DIAGNOSIS — I7121 Aneurysm of the ascending aorta, without rupture: Secondary | ICD-10-CM

## 2018-06-03 DIAGNOSIS — I519 Heart disease, unspecified: Secondary | ICD-10-CM

## 2018-06-03 NOTE — Telephone Encounter (Signed)
-----   Message from Rayfield Citizen, [place] sent at 06/03/2018  3:18 PM EDT ----- Regarding: RE: needs CT Angio Aorta for increased size of ascending aorta on echo Randy Pittman   Can you put me in a BMET   Stacey  ----- Message ----- From: Loa Socks, LPN Sent: 5/78/4696   2:04 PM To: Toy Care Snead, NT, [facility] Subject: needs CT Angio Aorta for increased size of a#  Dr Delton See wants this pt to have a CT Angio of the aorta to further evaluate his ascending aortic aneurysm that was noted an increase in size on his recent echo. Pt is scheduled to see Delton See on 06/25/18, and she would ideally like this study done prior to this visit. Order is in and pt aware that you will call to schedule.  Can you please arrange?  Thanks so much,  [employer]

## 2018-06-03 NOTE — Telephone Encounter (Signed)
Spoke with the pt and informed him that per Dr Delton See, his echo showed that his LVEF has further improved, now 55-60%, but since his  prior study on 05/01/2017 ascending aorta has further dilated from 42 mm to 44 mm, and she recommends that a  dedicated CTA or MRA to be done for further evaluation.  Informed the pt that Dr Delton See also stated that she will go into more depth about this at his visit with her on 06/25/18.  Informed the pt that I will place the order for the CT Angio Aorta w/wo contrast in the system and have our schedulers to call him back to have this scheduled, ideally prior to his appt with Dr Delton See on 06/25/18.  Pt verbalized understanding and agrees with this plan.

## 2018-06-03 NOTE — Telephone Encounter (Signed)
BMET order placed per CT protocol.  Pt to be scheduled for lab and CTA by scheduling.

## 2018-06-03 NOTE — Telephone Encounter (Signed)
-----   Message from [name], MD sent at 06/02/2018 10:53 PM EDT ----- LVEF further improved, now 55-60%, Since the prior study on 05/01/2017 ascending aorta has further dilated from 42 mm to 44 mm. A dedicated CTA or MRA is recommended for further evaluation. We can discuss it at the next visit

## 2018-06-07 ENCOUNTER — Other Ambulatory Visit: Payer: 59 | Admitting: *Deleted

## 2018-06-07 DIAGNOSIS — I7121 Aneurysm of the ascending aorta, without rupture: Secondary | ICD-10-CM

## 2018-06-07 DIAGNOSIS — I712 Thoracic aortic aneurysm, without rupture: Secondary | ICD-10-CM

## 2018-06-07 DIAGNOSIS — I519 Heart disease, unspecified: Secondary | ICD-10-CM

## 2018-06-07 DIAGNOSIS — I2510 Atherosclerotic heart disease of native coronary artery without angina pectoris: Secondary | ICD-10-CM

## 2018-06-07 DIAGNOSIS — Z8249 Family history of ischemic heart disease and other diseases of the circulatory system: Secondary | ICD-10-CM

## 2018-06-07 DIAGNOSIS — E782 Mixed hyperlipidemia: Secondary | ICD-10-CM

## 2018-06-07 LAB — BASIC METABOLIC PANEL
BUN/Creatinine Ratio: 14 (ref 10–24)
BUN: 15 mg/dL (ref 8–27)
CO2: 24 mmol/L (ref 20–29)
Calcium: 8.9 mg/dL (ref 8.6–10.2)
Chloride: 107 mmol/L — ABNORMAL HIGH (ref 96–106)
Creatinine, Ser: 1.09 mg/dL (ref 0.76–1.27)
GFR calc Af Amer: 82 mL/min/{1.73_m2} (ref >59)
GFR calc non Af Amer: 71 mL/min/{1.73_m2} (ref >59)
Glucose: 98 mg/dL (ref 65–99)
Potassium: 4.2 mmol/L (ref 3.5–5.2)
Sodium: 144 mmol/L (ref 134–144)

## 2018-06-16 ENCOUNTER — Ambulatory Visit (INDEPENDENT_AMBULATORY_CARE_PROVIDER_SITE_OTHER)
Admission: RE | Admit: 2018-06-16 | Discharge: 2018-06-16 | Disposition: A | Discharge status: Home / Self Care | Payer: 59 | Source: Ambulatory Visit | Attending: Cardiology | Admitting: Cardiology

## 2018-06-16 DIAGNOSIS — I712 Thoracic aortic aneurysm, without rupture: Secondary | ICD-10-CM

## 2018-06-16 DIAGNOSIS — I7121 Aneurysm of the ascending aorta, without rupture: Secondary | ICD-10-CM

## 2018-06-16 MED ORDER — IOPAMIDOL (ISOVUE-370) INJECTION 76%
100.0000 mL | Freq: Once | INTRAVENOUS | Status: AC | PRN
  Administered 2018-06-16: 100 mL via INTRAVENOUS

## 2018-06-18 ENCOUNTER — Telehealth: Payer: Self-pay | Admitting: *Deleted

## 2018-06-18 DIAGNOSIS — I712 Thoracic aortic aneurysm, without rupture: Secondary | ICD-10-CM

## 2018-06-18 DIAGNOSIS — I255 Ischemic cardiomyopathy: Secondary | ICD-10-CM

## 2018-06-18 DIAGNOSIS — Z8249 Family history of ischemic heart disease and other diseases of the circulatory system: Secondary | ICD-10-CM

## 2018-06-18 DIAGNOSIS — I7121 Aneurysm of the ascending aorta, without rupture: Secondary | ICD-10-CM

## 2018-06-18 DIAGNOSIS — I2510 Atherosclerotic heart disease of native coronary artery without angina pectoris: Secondary | ICD-10-CM

## 2018-06-18 NOTE — Telephone Encounter (Signed)
-----   Message from [name], MD sent at 06/17/2018  7:05 PM EDT ----- His ascending aortic aneurysm measures 43 mm from previous 42 mm, we will follow it annually. No intervention needed right now.

## 2018-06-18 NOTE — Telephone Encounter (Signed)
Order for BMET placed and to be done in one year, prior to his CT Angio Aorta.  Wife is to schedule this pts BMET appt for one year out, at his 06/25/18 appt with Dr Delton See.  Pts CT Angio Aorta is scheduled for 06/20/2019.  Wife made aware of appt date and time by CT Scheduler.

## 2018-06-18 NOTE — Telephone Encounter (Signed)
-----   Message from Rayfield Citizen, [place] sent at 06/18/2018  9:14 AM EDT ----- Regarding: RE: repeat CT Angio  Aorta in one year per Delton See Pt is scheduled a year out he will need labs a week prior to CT in 2020. I asked his wife to try to sch BMET when they come in on Thursday to go over results. Thank you   Misty Stanley    ----- Message ----- From: Loa Socks, LPN Sent: 03/30/8118   8:47 AM To: Rayfield Citizen, NT, [facility] Subject: repeat CT Angio  Aorta in one year per Delton See  This pt needs to be scheduled for a repeat CT Angio of Aorta in one year per Dr Delton See, for follow-up of his ascending aortic aneurysm. Order is in the system and pts wife is aware that you will call to tentatively arrange this appt for one year out. Can you arrange?  Thanks,  [employer]

## 2018-06-18 NOTE — Telephone Encounter (Signed)
Spoke with the pts wife (on [place]), and endorsed to her that per Dr Delton See, the pts CT Angio of Aorta showed that his ascending aortic aneurysm 43 mm from previous 42 mm, and we will follow it yearly, and no intervention is needed right now. Informed the pts wife that I will place the order in the system and have a scheduler call them back to arrange for this repeat image to be done in 1 yr.  Also informed the wife that we will still plan to see the pt on 7/5.  Wife verbalized understanding and agrees with this plan.  Wife states she will endorse this to the pt.

## 2018-06-25 ENCOUNTER — Ambulatory Visit (INDEPENDENT_AMBULATORY_CARE_PROVIDER_SITE_OTHER): Payer: 59 | Admitting: Cardiology

## 2018-06-25 ENCOUNTER — Encounter: Payer: Self-pay | Admitting: Cardiology

## 2018-06-25 VITALS — BP 128/100 | HR 77 | Ht 75.0 in | Wt 216.8 lb

## 2018-06-25 DIAGNOSIS — I255 Ischemic cardiomyopathy: Secondary | ICD-10-CM

## 2018-06-25 DIAGNOSIS — I2510 Atherosclerotic heart disease of native coronary artery without angina pectoris: Secondary | ICD-10-CM

## 2018-06-25 DIAGNOSIS — E782 Mixed hyperlipidemia: Secondary | ICD-10-CM | POA: Diagnosis not present

## 2018-06-25 DIAGNOSIS — I10 Essential (primary) hypertension: Secondary | ICD-10-CM | POA: Diagnosis not present

## 2018-06-25 NOTE — Progress Notes (Signed)
Cardiology Office Note    Date:  06/25/2018   ID:  Randy Pittman, DOB 1951-06-08, MRN [id number]  PCP:  Joaquim Nam, MD  Cardiologist: Dr. Delton See  No chief complaint on file.   History of Present Illness:  Randy Pittman is a 67 y.o. male with history of CAD after undergoing lifestyle screening and noted to have PVCs.  He was asymptomatic.  His twin brother has significant ischemic cardiomyopathy.  Abnormal nuclear stress test 12/27/15 showed moderate appearing inferior wall ischemia from the apex to the base and diffuse hypokinesis is worse in the apex EF 43%.  Left heart cath showed significant 2 vessel CAD with subtotal 99% OM1 and 65% mid RCA and total occlusion of the PLA with collaterals he had DES to the circumflex marginal and RCA may need stenting in the future if it progresses.  He had mild LV dysfunction ejection fraction 50%.  Also has hypertension and HLD.  Last seen by Dr. Delton See 04/2017 and doing well.  11/09/17 - Patient comes in today for follow-up accompanied by his wife.  He works 12-hour days and does yard work but does not get any regular activity.  He denies any chest pain, palpitations, dyspnea, dyspnea on exertion, dizziness or presyncope.  He is concerned because his twin brother continues to decline and has a defibrillator.  Patient has never had any cardiac symptoms.  Blood pressure is up today but he checks it regularly at work and at home and it is always normal.  12/09/2017 - the patient is coming after 1 month, he states that he is free of dyspnea or shortness of breath. He works 12 hour shifts, at night where he walks a lot and has no symptoms. He also has no lower extremity edema orthopnea or proximal nocturnal dyspnea. He has no bleeding only mild bruising with dual antiplatelet therapy, his concern with the new house plant that he is getting in January his Marden Noble might be more expensive. He has no side effects with atorvastatin.   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  He checks his blood pressure frequently at work and at home and it is always at goal.  He has been experiencing a lot of bruising with Brilinta also his co-pay significantly high so he would like to discontinue if it safe.  He is tolerating atorvastatin well.  Past Medical History:  Diagnosis Date  . CAD (coronary artery disease)   . Hyperlipidemia 07/1995  . Low testosterone    per Dr. Reuel Boom, with urology    Past Surgical History:  Procedure Laterality Date  . CARDIAC CATHETERIZATION N/A 01/01/2017   Procedure: Left Heart Cath and Coronary Angiography;  Surgeon: Lennette Bihari, MD;  Location: [facility] INVASIVE CV LAB;  Service: Cardiovascular;  Laterality: N/A;  . CARDIAC CATHETERIZATION N/A 01/01/2017   Procedure: Coronary Stent Intervention;  Surgeon: Lennette Bihari, MD;  Location: [facility] INVASIVE CV LAB;  Service: Cardiovascular;  Laterality: N/A;  . COLONOSCOPY    . CORONARY ANGIOPLASTY WITH STENT PLACEMENT  01/01/2017    Current Medications: Current Meds  Medication Sig  . aspirin 81 MG tablet Take 81 mg by mouth daily.    Marland Kitchen atorvastatin (LIPITOR) 80 MG tablet TAKE 1 TABLET BY MOUTH  DAILY AT 6PM.  . cholecalciferol (VITAMIN  D) 1000 UNITS tablet Take 1,000 Units by mouth daily.  . metoprolol succinate (TOPROL-XL) 25 MG 24 hr tablet TAKE 1 TABLET BY MOUTH  EVERY EVENING  . nitroGLYCERIN (NITROSTAT) 0.4 MG SL tablet Place 1 tablet (0.4 mg total) under the tongue every 5 (five) minutes as needed for chest pain.  . vitamin B-12 (CYANOCOBALAMIN) 500 MCG tablet Take 500 mcg by mouth daily.  . [DISCONTINUED] BRILINTA 90 MG TABS tablet TAKE 1 TABLET BY MOUTH TWO  TIMES DAILY     Allergies:   Patient has no known allergies.   Social History   Socioeconomic History  . Marital  status: Married    Spouse name: Not on file  . Number of children: Not on file  . Years of education: Not on file  . Highest education level: Not on file  Occupational History  . Occupation: Scientific laboratory technician [employer]  Social Needs  . Financial resource strain: Not on file  . Food insecurity:    Worry: Not on file    Inability: Not on file  . Transportation needs:    Medical: Not on file    Non-medical: Not on file  Tobacco Use  . Smoking status: Former Smoker    Packs/day: 2.00    Years: 5.00    Pack years: 10.00    Types: Cigarettes    Last attempt to quit: 1980    Years since quitting: 39.5  . Smokeless tobacco: Former Neurosurgeon    Types: Chew    Quit date: 1975  Substance and Sexual Activity  . Alcohol use: Yes    Alcohol/week: 2.4 oz    Types: 2 Glasses of wine, 2 Cans of beer per week  . Drug use: No  . Sexual activity: Not on file  Lifestyle  . Physical activity:    Days per week: Not on file    Minutes per session: Not on file  . Stress: Not on file  Relationships  . Social connections:    Talks on phone: Not on file    Gets together: Not on file    Attends religious service: Not on file    Active member of club or organization: Not on file    Attends meetings of clubs or organizations: Not on file    Relationship status: Not on file  Other Topics Concern  . 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   ROS:   Please see the history of present illness.    Review of Systems  Constitution: Negative.  HENT: Negative.   Cardiovascular: Negative.   Respiratory: Negative.   Endocrine: Negative.     Hematologic/Lymphatic: Negative.   Musculoskeletal: Negative.   Gastrointestinal: Negative.   Genitourinary: Negative.   Neurological: Negative.    All other systems reviewed and are negative.  PHYSICAL EXAM:   VS:  BP (!) 128/100   Pulse 77   Ht  (1.905 m)   Wt 216 lb 12.8 oz (98.3 kg)   SpO2 97%   BMI 27.10 kg/m   Physical Exam  GEN: Well nourished, well  developed, in no acute distress  Neck: no JVD, carotid bruits, or masses Cardiac:RRR; no murmurs, rubs, or gallops  Respiratory:  clear to auscultation bilaterally, normal work of breathing GI: soft, nontender, nondistended, + BS Ext: without cyanosis, clubbing, or edema, Good distal pulses bilaterally Neuro:  Alert and Oriented x 3 Psych: euthymic mood, full affect  Wt Readings from Last 3 Encounters:  06/25/18 216 lb 12.8 oz (98.3 kg)  02/08/18 214 lb 8 oz (97.3 kg)  12/09/17 214 lb 6.4 oz (97.3 kg)    Studies/Labs Reviewed:   EKG:  EKG is ordered today.  It shows normal sinus rhythm with left axis deviation, inferior infarct age undetermined, this is unchanged from prior and personally reviewed.  Recent Labs: 02/08/2018: ALT 16; Hemoglobin 15.4; Platelets 196.0; TSH 1.08 06/07/2018: BUN 15; Creatinine, Ser 1.09; Potassium 4.2; Sodium 144   Lipid Panel    Component Value Date/Time   CHOL 130 11/09/2017 0939   TRIG 57 11/09/2017 0939   HDL 61 11/09/2017 0939   CHOLHDL 2.1 11/09/2017 0939   CHOLHDL 3 08/01/2016 0816   VLDL 19.2 08/01/2016 0816   LDLCALC 58 11/09/2017 0939   LDLDIRECT 169.4 07/09/2012 1021   Additional studies/ records that were reviewed today include:   Cardiac catheterization 01/01/17 Procedures    Coronary Stent Intervention  Left Heart Cath and Coronary Angiography  Conclusion       There is mild left ventricular systolic dysfunction.  Dist RCA lesion, 100 %stenosed.  Mid RCA lesion, 65 %stenosed.  A STENT SYNERGY DES 2.5X28 drug eluting stent was successfully placed.  Lat  1st Mrg lesion, 99 %stenosed.  Post intervention, there is a 0% residual stenosis.   Low-normal to mild LV dysfunction with an ejection fraction of 50% with a small focal region of distal inferior hypocontractility.   Significant 2 vessel coronary obstructive disease with a subtotal 99 100% stenosis involving the inferior branch of the obtuse marginal 1 vessel; and 65% mid RCA stenosis proximal to the acute margin with total occlusion of the PLA with retrograde collateralization to the distal PLA via the AV groove circumflex branches.  LAD with mild luminal irregularity.   Successful PCI to the circumflex marginal vessel with ultimate insertion of a Synergy DES 2.528 mm stent postdilated 2.75 mm with the 99% stenosis reduced to 0% and resumption of brisk TIMI-3 flow    2-D echo 12/26/16   Study Conclusions   - Left ventricle: The cavity size was normal. Wall thickness was   increased in a pattern of mild LVH. Systolic function was mildly   to moderately reduced. The estimated ejection fraction was in the   range of 40% to 45%. Moderate hypokinesis of the   mid-apicalinferoseptal myocardium. Doppler parameters are   consistent with abnormal left ventricular relaxation (grade 1   diastolic dysfunction). - Aortic valve: There was trivial regurgitation. - Right atrium: The atrium was mildly dilated.   TTE: 05/01/2017 - Left ventricle: The cavity size was normal. Wall thickness was   increased in a pattern of mild LVH. There was moderate concentric   hypertrophy. Systolic function was normal. The estimated ejection   fraction was in the range of 50% to 55%. Diffuse hypokinesis.   Doppler parameters are consistent with abnormal left ventricular   relaxation (grade 1 diastolic dysfunction). - Aortic valve: There was mild regurgitation. - Aorta: Ascending aortic diameter: 42 mm (S). - Ascending aorta: The ascending aorta was mildly dilated. - Mitral valve: Calcified annulus. Mildly thickened,  mildly   calcified leaflets . - Left atrium: The atrium was mildly dilated.   Nuclear stress test 12/26/16 Study Highlights     Nuclear stress EF: 43%.  Findings consistent with ischemia.  This is an intermediate risk study.  The left ventricular ejection fraction is moderately decreased (30-44%).  Upsloping ST segment depression ST segment depression of 1 mm was noted during stress in the I, aVL, V5 and V6 leads.   Moderate appearing inferior wall ischemia from apex to base Diffuse hypokinesis worse in apex EF 43%      EKG performed today June 25, 2018 shows normal sinus rhythm left axis deviation, otherwise normal EKG unchanged from prior.  This was personally reviewed.   ASSESSMENT:    1. CAD in native artery   2. Essential hypertension   3. Mixed hyperlipidemia    PLAN:  In order of problems listed above:  1. CAD status post DES to the circumflex marginal with residual RCA disease 12/2016.  Patient was asymptomatic prior to this but had a lot of PVCs on lifestyle screening that led to an echo and stress test that was abnormal.  I have reviewed his nuclear stress test from 11/17/2017 there is a scar in the inferior wall however no ischemia. The patient is asymptomatic we will continue medical management. He has very significant family history of premature coronary artery disease including his twin brother who has low EF and internal defibrillator.  We will discontinue Brilinta as it is very expensive and causing significant bruising.  We will continue Toprol XL, he discontinued losartan in March his blood pressure is well controlled.  We will continue atorvastatin and aspirin 81 mg daily.  2. Ischemic cardiomyopathy ejection fraction improved to 50-55% on limited echo 05/01/17.  No heart failure symptoms. We will continue Toprol-XL.  3. 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 Hyperlipidemia on Lipitor - all lipids at goal and normal LFTs.  Follow-up in 6  months.  Medication Adjustments/Labs and Tests Ordered: Current medicines are reviewed at length with the patient today.  Concerns regarding medicines are outlined above.  Medication changes, Labs and Tests ordered today are listed in the Patient Instructions below. Patient Instructions  Medication Instructions:  Your physician has recommended you make the following change in your medication:   STOP: Brilinta  Labwork: None ordered  Testing/Procedures: None ordered  Follow-Up: Your physician wants you to follow-up in: 6 months with Dr. Delton See. You will receive a reminder letter in the mail two months in advance. If you don't receive a letter, please call our office to schedule the follow-up appointment.   Any Other Special Instructions Will Be Listed Below (If Applicable).     If you need a refill on your cardiac medications before your next appointment, please call your pharmacy.      Signed, Tobias Alexander, MD  06/25/2018 11:31 AM    [facility] [address] [place], [place], [place]  [address] Phone: [phone]; Fax: [phone]

## 2018-06-25 NOTE — Patient Instructions (Signed)
Medication Instructions:  Your physician has recommended you make the following change in your medication:   STOP: Brilinta  Labwork: None ordered  Testing/Procedures: None ordered  Follow-Up: Your physician wants you to follow-up in: 6 months with Dr. Delton See. You will receive a reminder letter in the mail two months in advance. If you don't receive a letter, please call our office to schedule the follow-up appointment.   Any Other Special Instructions Will Be Listed Below (If Applicable).     If you need a refill on your cardiac medications before your next appointment, please call your pharmacy.

## 2018-08-20 ENCOUNTER — Ambulatory Visit (INDEPENDENT_AMBULATORY_CARE_PROVIDER_SITE_OTHER): Payer: 59 | Admitting: Family Medicine

## 2018-08-20 ENCOUNTER — Encounter: Payer: Self-pay | Admitting: Family Medicine

## 2018-08-20 VITALS — BP 118/84 | HR 84 | Temp 98.5°F | Ht 75.0 in | Wt 218.5 lb

## 2018-08-20 DIAGNOSIS — Z125 Encounter for screening for malignant neoplasm of prostate: Secondary | ICD-10-CM | POA: Diagnosis not present

## 2018-08-20 DIAGNOSIS — I2510 Atherosclerotic heart disease of native coronary artery without angina pectoris: Secondary | ICD-10-CM | POA: Diagnosis not present

## 2018-08-20 DIAGNOSIS — Z23 Encounter for immunization: Secondary | ICD-10-CM

## 2018-08-20 DIAGNOSIS — Z7189 Other specified counseling: Secondary | ICD-10-CM

## 2018-08-20 DIAGNOSIS — Z0000 Encounter for general adult medical examination without abnormal findings: Secondary | ICD-10-CM

## 2018-08-20 LAB — COMPREHENSIVE METABOLIC PANEL
ALBUMIN: 4.5 g/dL (ref 3.5–5.2)
ALK PHOS: 48 U/L (ref 39–117)
ALT: 19 U/L (ref 0–53)
AST: 16 U/L (ref 0–37)
BILIRUBIN TOTAL: 0.9 mg/dL (ref 0.2–1.2)
BUN: 18 mg/dL (ref 6–23)
CO2: 32 mEq/L (ref 19–32)
Calcium: 9.3 mg/dL (ref 8.4–10.5)
Chloride: 104 mEq/L (ref 96–112)
Creatinine, Ser: 1.05 mg/dL (ref 0.40–1.50)
GFR: 75.25 mL/min (ref >60.00)
Glucose, Bld: 96 mg/dL (ref 70–99)
Potassium: 4.6 mEq/L (ref 3.5–5.1)
SODIUM: 142 meq/L (ref 135–145)
TOTAL PROTEIN: 6.7 g/dL (ref 6.0–8.3)

## 2018-08-20 LAB — LIPID PANEL
Cholesterol: 143 mg/dL (ref 0–200)
HDL: 54.9 mg/dL (ref >39.00)
LDL Cholesterol: 72 mg/dL (ref 0–99)
NonHDL: 88.54
Total CHOL/HDL Ratio: 3
Triglycerides: 84 mg/dL (ref 0.0–149.0)
VLDL: 16.8 mg/dL (ref 0.0–40.0)

## 2018-08-20 LAB — CBC WITH DIFFERENTIAL/PLATELET
Basophils Absolute: 0 10*3/uL (ref 0.0–0.1)
Basophils Relative: 0.9 % (ref 0.0–3.0)
Eosinophils Absolute: 0.3 10*3/uL (ref 0.0–0.7)
Eosinophils Relative: 5.4 % — ABNORMAL HIGH (ref 0.0–5.0)
HCT: 45.3 % (ref 39.0–52.0)
HEMOGLOBIN: 15.2 g/dL (ref 13.0–17.0)
Lymphocytes Relative: 22.1 % (ref 12.0–46.0)
Lymphs Abs: 1.1 10*3/uL (ref 0.7–4.0)
MCHC: 33.5 g/dL (ref 30.0–36.0)
MCV: 88.6 fl (ref 78.0–100.0)
MONO ABS: 0.4 10*3/uL (ref 0.1–1.0)
Monocytes Relative: 9.3 % (ref 3.0–12.0)
NEUTROS PCT: 62.3 % (ref 43.0–77.0)
Neutro Abs: 3 10*3/uL (ref 1.4–7.7)
Platelets: 165 10*3/uL (ref 150.0–400.0)
RBC: 5.12 Mil/uL (ref 4.22–5.81)
RDW: 13.5 % (ref 11.5–15.5)
WBC: 4.8 10*3/uL (ref 4.0–10.5)

## 2018-08-20 LAB — PSA, MEDICARE: PSA: 1.24 ng/mL (ref 0.10–4.00)

## 2018-08-20 NOTE — Patient Instructions (Addendum)
Go to the lab on the way out.  We'll contact you with your lab report. I would get a flu shot each fall.   Pneumonia shot today.   Take care.  Glad to see you.  Update me as needed.   Don't change your meds for now.

## 2018-08-20 NOTE — Progress Notes (Signed)
CPE- See plan.  Routine anticipatory guidance given to patient.  See health maintenance.  The possibility exists that previously documented standard health maintenance information may have been brought forward from a previous encounter into this note.  If needed, that same information has been updated to reflect the current situation based on today's encounter.    Flu to be done at work Shingles out of stock.  Discussed with patient. PNA 2019 Tetanus 2011 Colonoscopy 2010 PSA pending.  Potential risk for false positive discussed with patient.  See notes on labs. Advance directive discussed with patient.  Wife designated if patient were incapacitated  His mother is in poor health, d/w pt. She needed placement.  Condolences offered.  Discussed with patient.  CAD.  No CP, SOB, BLE edema.  Has seen cards.  Recent EKG done. No NTG use.  Labs pending.  See notes on labs.  PMH and SH reviewed  Meds, vitals, and allergies reviewed.   ROS: Per HPI.  Unless specifically indicated otherwise in HPI, the patient denies:  General: fever. Eyes: acute vision changes ENT: sore throat Cardiovascular: chest pain Respiratory: SOB GI: vomiting GU: dysuria Musculoskeletal: acute back pain Derm: acute rash Neuro: acute motor dysfunction Psych: worsening mood Endocrine: polydipsia Heme: bleeding Allergy: hayfever  GEN: nad, alert and oriented HEENT: mucous membranes moist NECK: supple w/o LA CV: rrr. PULM: ctab, no inc wob ABD: soft, +bs EXT: no edema SKIN: no acute rash

## 2018-08-23 NOTE — Assessment & Plan Note (Signed)
No chest pain.  See notes on labs.  Continue current medications as is.

## 2018-08-23 NOTE — Assessment & Plan Note (Signed)
Advance directive discussed with patient.  Wife designated if patient were incapacitated. 

## 2018-08-23 NOTE — Assessment & Plan Note (Signed)
Flu to be done at work Shingles out of stock.  Discussed with patient. PNA 2019 Tetanus 2011 Colonoscopy 2010 PSA pending.  Potential risk for false positive discussed with patient.  See notes on labs. Advance directive discussed with patient.  Wife designated if patient were incapacitated

## 2018-09-20 ENCOUNTER — Telehealth: Payer: Self-pay

## 2018-09-20 NOTE — Telephone Encounter (Signed)
Pt notified and voiced understanding we do not have the shingrix vaccine at this time; pt will ck with pharmacies to see if shingrix is available and if needed pt will cb for rx to go to a pharmacy.

## 2018-09-20 NOTE — Telephone Encounter (Signed)
Copied from CRM [id number]. Topic: Inquiry >> Sep 20, 2018  2:09 PM Crist Infante wrote: Reason for CRM: pt states at his cpe last month you did not have the shingrix in.  He would like to know if you do now?  Pt would like to schedule.

## 2019-01-03 ENCOUNTER — Other Ambulatory Visit: Payer: Self-pay | Admitting: Cardiology

## 2019-01-03 ENCOUNTER — Ambulatory Visit (INDEPENDENT_AMBULATORY_CARE_PROVIDER_SITE_OTHER): Payer: Medicare Other | Admitting: Cardiology

## 2019-01-03 ENCOUNTER — Encounter: Payer: Self-pay | Admitting: Cardiology

## 2019-01-03 VITALS — BP 140/92 | HR 78 | Ht 75.0 in | Wt 222.2 lb

## 2019-01-03 DIAGNOSIS — I10 Essential (primary) hypertension: Secondary | ICD-10-CM | POA: Diagnosis not present

## 2019-01-03 DIAGNOSIS — I2510 Atherosclerotic heart disease of native coronary artery without angina pectoris: Secondary | ICD-10-CM

## 2019-01-03 DIAGNOSIS — E782 Mixed hyperlipidemia: Secondary | ICD-10-CM

## 2019-01-03 MED ORDER — METOPROLOL SUCCINATE ER 25 MG PO TB24: 25.0000 mg | ORAL_TABLET | Freq: Every evening | ORAL | 3 refills | Status: DC

## 2019-01-03 MED ORDER — ATORVASTATIN CALCIUM 80 MG PO TABS: ORAL_TABLET | ORAL | 3 refills | Status: DC

## 2019-01-03 NOTE — Patient Instructions (Signed)
Medication Instructions:   Your physician recommends that you continue on your current medications as directed. Please refer to the Current Medication list given to you today.  If you need a refill on your cardiac medications before your next appointment, please call your pharmacy.      Follow-Up: At [facility], you and your health needs are our priority.  As part of our continuing mission to provide you with exceptional heart care, we have created designated Provider Care Teams.  These Care Teams include your primary Cardiologist (physician) and Advanced Practice Providers (APPs -  Physician Assistants and Nurse Practitioners) who all work together to provide you with the care you need, when you need it. You will need a follow up appointment in 12 months.  Please call our office 2 months in advance to schedule this appointment.  You may see Katarina Nelson, MD or one of the following Advanced Practice Providers on your designated Care Team:   Brittainy Simmons, PA-C Dayna Dunn, PA-C . Michele Lenze, PA-C     

## 2019-01-03 NOTE — Progress Notes (Signed)
Cardiology Office Note    Date:  01/03/2019   ID:  Randy Pittman, DOB November 28, 1951, MRN [id number]  PCP:  Joaquim Nam, MD  Cardiologist: Dr. Delton See  No chief complaint on file.   History of Present Illness:  Randy Pittman is a 68 y.o. male with history of CAD after undergoing lifestyle screening and noted to have PVCs.  He was asymptomatic.  His twin brother has significant ischemic cardiomyopathy.  Abnormal nuclear stress test 12/27/15 showed moderate appearing inferior wall ischemia from the apex to the base and diffuse hypokinesis is worse in the apex EF 43%.  Left heart cath showed significant 2 vessel CAD with subtotal 99% OM1 and 65% mid RCA and total occlusion of the PLA with collaterals he had DES to the circumflex marginal and RCA may need stenting in the future if it progresses.  He had mild LV dysfunction ejection fraction 50%.  Also has hypertension and HLD.  Last seen by Dr. Delton See 04/2017 and doing well.  11/09/17 - Patient comes in today for follow-up accompanied by his wife.  He works 12-hour days and does yard work but does not get any regular activity.  He denies any chest pain, palpitations, dyspnea, dyspnea on exertion, dizziness.  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  He checks his blood pressure frequently at work and at home and it is always at goal.  He has been experiencing a lot of bruising with Brilinta also his co-pay significantly high so he would like to discontinue if it safe.  He is tolerating atorvastatin well.  01/03/2019 -this is 6 months follow-up, the patient states that he feels great, he continues to work 12-hour shifts walks on average 10-14,000 steps a day and has no symptoms.  He is compliant with his medications and has no side effects.  Past Medical  History:  Diagnosis Date  . CAD (coronary artery disease)   . Hyperlipidemia 07/1995  . Low testosterone    per Dr. Reuel Boom, with urology    Past Surgical History:  Procedure Laterality Date  . CARDIAC CATHETERIZATION N/A 01/01/2017   Procedure: Left Heart Cath and Coronary Angiography;  Surgeon: Lennette Bihari, MD;  Location: [facility] INVASIVE CV LAB;  Service: Cardiovascular;  Laterality: N/A;  . CARDIAC CATHETERIZATION N/A 01/01/2017   Procedure: Coronary Stent Intervention;  Surgeon: Lennette Bihari, MD;  Location: [facility] INVASIVE CV LAB;  Service: Cardiovascular;  Laterality: N/A;  . COLONOSCOPY    . CORONARY ANGIOPLASTY WITH STENT PLACEMENT  01/01/2017    Current Medications: Current Meds  Medication Sig  . aspirin 81 MG tablet Take 81 mg by mouth daily.    Marland Kitchen atorvastatin (LIPITOR) 80 MG tablet TAKE 1 TABLET BY MOUTH  DAILY AT 6PM.  . cholecalciferol (VITAMIN D) 1000 UNITS tablet Take 1,000 Units by mouth daily.  . metoprolol succinate (TOPROL-XL) 25 MG 24 hr tablet TAKE 1 TABLET BY MOUTH  EVERY EVENING  . nitroGLYCERIN (NITROSTAT) 0.4 MG SL tablet Place 1 tablet (0.4 mg total) under the tongue every 5 (five) minutes as needed for chest pain.  . vitamin B-12 (CYANOCOBALAMIN) 500 MCG tablet Take 500 mcg by mouth daily.     Allergies:   Patient has no known allergies.   Social History   Socioeconomic History  . Marital  status: Married    Spouse name: Not on file  . Number of children: Not on file  . Years of education: Not on file  . Highest education level: Not on file  Occupational History  . Occupation: Scientific laboratory technician [employer]  Social Needs  . Financial resource strain: Not on file  . Food insecurity:    Worry: Not on file    Inability: Not on file  . Transportation needs:    Medical: Not on file    Non-medical: Not on file  Tobacco Use  . Smoking status: Former Smoker    Packs/day: 2.00    Years: 5.00    Pack years: 10.00    Types: Cigarettes    Last attempt to quit: 1980     Years since quitting: 40.0  . Smokeless tobacco: Former Neurosurgeon    Types: Chew    Quit date: 1975  Substance and Sexual Activity  . Alcohol use: Yes    Alcohol/week: 4.0 standard drinks    Types: 2 Glasses of wine, 2 Cans of beer per week  . Drug use: No  . Sexual activity: Not on file  Lifestyle  . Physical activity:    Days per week: Not on file    Minutes per session: Not on file  . Stress: Not on file  Relationships  . Social connections:    Talks on phone: Not on file    Gets together: Not on file    Attends religious service: Not on file    Active member of club or organization: Not on file    Attends meetings of clubs or organizations: Not on file    Relationship status: Not on file  Other Topics Concern  . 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   ROS:   Please see the history of present illness.    Review of Systems  Constitution: Negative.  HENT: Negative.   Cardiovascular: Negative.   Respiratory: Negative.   Endocrine: Negative.   Hematologic/Lymphatic: Negative.   Musculoskeletal: Negative.   Gastrointestinal: Negative.   Genitourinary: Negative.   Neurological: Negative.    All other systems reviewed and are negative.  PHYSICAL EXAM:   VS:  BP (!) 140/92   Pulse 78   Ht  (1.905 m)   Wt 222 lb 3.2 oz (100.8 kg)   BMI 27.77 kg/m   Physical Exam  GEN: Well nourished, well developed, in no acute distress  Neck: no JVD, carotid bruits, or masses Cardiac:RRR; no murmurs, rubs, or gallops  Respiratory:  clear to  auscultation bilaterally, normal work of breathing GI: soft, nontender, nondistended, + BS Ext: without cyanosis, clubbing, or edema, Good distal pulses bilaterally Neuro:  Alert and Oriented x 3 Psych: euthymic mood, full affect  Wt Readings from Last 3 Encounters:  01/03/19 222 lb 3.2 oz (100.8 kg)  08/20/18 218 lb 8 oz (99.1 kg)  06/25/18 216 lb 12.8 oz (98.3 kg)    Studies/Labs Reviewed:   EKG:  EKG is ordered today.  It shows normal sinus rhythm with left axis deviation, inferior infarct age undetermined, this is unchanged from prior and personally reviewed.  Recent Labs: 02/08/2018: TSH 1.08 08/20/2018: ALT 19; BUN 18; Creatinine, Ser 1.05; Hemoglobin 15.2; Platelets 165.0; Potassium 4.6; Sodium 142   Lipid Panel    Component Value Date/Time   CHOL 143 08/20/2018 1107   CHOL 130 11/09/2017 0939   TRIG 84.0 08/20/2018 1107   HDL 54.90 08/20/2018 1107   HDL 61 11/09/2017 0939   CHOLHDL 3 08/20/2018 1107   VLDL 16.8 08/20/2018 1107   LDLCALC 72 08/20/2018 1107   LDLCALC 58 11/09/2017 0939   LDLDIRECT 169.4 07/09/2012 1021   Additional studies/ records that were reviewed today include:   Cardiac catheterization 01/01/17 Procedures    Coronary Stent Intervention  Left Heart Cath and Coronary Angiography  Conclusion       There is mild left ventricular systolic dysfunction.  Dist RCA lesion, 100 %stenosed.  Mid RCA lesion, 65 %stenosed.  A STENT SYNERGY DES 2.5X28 drug eluting stent was successfully placed.  Lat 1st Mrg lesion, 99 %stenosed.  Post intervention, there is a 0% residual stenosis.   Low-normal to mild LV dysfunction with an ejection fraction of 50% with a small focal region of distal inferior hypocontractility.   Significant 2 vessel coronary obstructive disease with a subtotal 99 100% stenosis involving the inferior branch of the obtuse marginal 1 vessel; and 65% mid RCA stenosis proximal to the acute margin with total occlusion of the PLA with  retrograde collateralization to the distal PLA via the AV groove circumflex branches.  LAD with mild luminal irregularity.   Successful PCI to the circumflex marginal vessel with ultimate insertion of a Synergy DES 2.528 mm stent postdilated 2.75 mm with the 99% stenosis reduced to 0% and resumption of brisk TIMI-3 flow    2-D echo 12/26/16   Study Conclusions   - Left ventricle: The cavity size was normal. Wall thickness was   increased in a pattern of mild LVH. Systolic function was mildly   to moderately reduced. The estimated ejection fraction was in the   range of 40% to 45%. Moderate hypokinesis of the   mid-apicalinferoseptal myocardium. Doppler parameters are   consistent with abnormal left ventricular relaxation (grade 1   diastolic dysfunction). - Aortic valve: There was trivial regurgitation. - Right atrium: The atrium was mildly dilated.   TTE: 05/01/2017 - Left ventricle: The cavity size was normal. Wall thickness was   increased in a pattern of mild LVH. There was moderate concentric   hypertrophy. Systolic function was normal. The estimated ejection   fraction was in the range of 50% to 55%. Diffuse hypokinesis.   Doppler parameters are consistent with abnormal left ventricular   relaxation (grade 1 diastolic dysfunction). - Aortic valve: There was mild regurgitation. - Aorta: Ascending aortic diameter: 42 mm (S). - Ascending aorta: The ascending aorta was mildly dilated. - Mitral valve: Calcified annulus. Mildly thickened, mildly   calcified leaflets . - Left atrium: The atrium was mildly dilated.   Nuclear stress test 12/26/16 Study Highlights     Nuclear stress EF: 43%.  Findings consistent with ischemia.  This is an intermediate risk study.  The left ventricular ejection fraction is moderately decreased (30-44%).  Upsloping ST segment depression ST segment depression of 1 mm was noted during stress in the I, aVL, V5 and V6 leads.   Moderate appearing  inferior wall ischemia from apex to base Diffuse hypokinesis worse in apex  EF 43%      EKG performed today June 25, 2018 shows normal sinus rhythm left axis deviation, otherwise normal EKG unchanged from prior.  This was personally reviewed.   ASSESSMENT:    1. CAD in native artery   2. Essential hypertension   3. Mixed hyperlipidemia    PLAN:  In order of problems listed above:  1. CAD status post DES to the circumflex marginal with residual RCA disease 12/2016.  Patient was asymptomatic prior to this but had a lot of PVCs on lifestyle screening that led to an echo and stress test that was abnormal.  I have reviewed his nuclear stress test from 11/17/2017 there is a scar in the inferior wall however no ischemia. The patient is asymptomatic we will continue medical management. He has very significant family history of premature coronary artery disease including his twin brother who has low EF and internal defibrillator.  He is completely asymptomatic, no ischemic work-up needed right now we will continue aspirin and metoprolol as well as atorvastatin.  2. Ischemic cardiomyopathy ejection fraction improved to 50-55% on limited echo 05/01/17.  No heart failure symptoms. We will continue Toprol-XL.  3. 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 Hyperlipidemia on Lipitor - all lipids at goal and normal LFTs in June 2019.  Follow-up in 1 year.  Medication Adjustments/Labs and Tests Ordered: Current medicines are reviewed at length with the patient today.  Concerns regarding medicines are outlined above.  Medication changes, Labs and Tests ordered today are listed in the Patient Instructions below. Patient Instructions  Medication Instructions:   Your physician recommends that you continue on your current medications as directed. Please refer to the Current Medication list given to you today.  If you need a refill on your cardiac medications before your next appointment, please call your  pharmacy.     Follow-Up: At [facility], you and your health needs are our priority.  As part of our continuing mission to provide you with exceptional heart care, we have created designated Provider Care Teams.  These Care Teams include your primary Cardiologist (physician) and Advanced Practice Providers (APPs -  Physician Assistants and Nurse Practitioners) who all work together to provide you with the care you need, when you need it. You will need a follow up appointment in 12 months.  Please call our office 2 months in advance to schedule this appointment.  You may see Tobias AlexanderKatarina Raelin Pixler, MD or one of the following Advanced Practice Providers on your designated Care Team:   WelcomeBrittainy Simmons, PA-C Ronie Spiesayna Dunn, PA-C . Jacolyn ReedyMichele Lenze, PA-C        Signed, Tobias AlexanderKatarina Kalynn Declercq, MD  01/03/2019 11:26 AM    [facility] [address], [place], [place]  [address] Phone: [phone]; Fax: [phone]

## 2019-01-03 NOTE — Telephone Encounter (Signed)
Pt's medications were sent to pt's pharmacy as requested. Confirmation received.  

## 2019-03-08 DIAGNOSIS — H35371 Puckering of macula, right eye: Secondary | ICD-10-CM | POA: Diagnosis not present

## 2019-03-08 DIAGNOSIS — H47323 Drusen of optic disc, bilateral: Secondary | ICD-10-CM | POA: Diagnosis not present

## 2019-03-08 DIAGNOSIS — H2513 Age-related nuclear cataract, bilateral: Secondary | ICD-10-CM | POA: Diagnosis not present

## 2019-03-08 DIAGNOSIS — H40013 Open angle with borderline findings, low risk, bilateral: Secondary | ICD-10-CM | POA: Diagnosis not present

## 2019-06-10 ENCOUNTER — Other Ambulatory Visit: Payer: Medicare Other | Admitting: *Deleted

## 2019-06-10 ENCOUNTER — Other Ambulatory Visit: Payer: Self-pay

## 2019-06-10 DIAGNOSIS — Z8249 Family history of ischemic heart disease and other diseases of the circulatory system: Secondary | ICD-10-CM | POA: Diagnosis not present

## 2019-06-10 DIAGNOSIS — I255 Ischemic cardiomyopathy: Secondary | ICD-10-CM | POA: Diagnosis not present

## 2019-06-10 DIAGNOSIS — I2510 Atherosclerotic heart disease of native coronary artery without angina pectoris: Secondary | ICD-10-CM

## 2019-06-10 DIAGNOSIS — I712 Thoracic aortic aneurysm, without rupture: Secondary | ICD-10-CM

## 2019-06-10 DIAGNOSIS — I7121 Aneurysm of the ascending aorta, without rupture: Secondary | ICD-10-CM

## 2019-06-10 LAB — BASIC METABOLIC PANEL
BUN/Creatinine Ratio: 16 (ref 10–24)
BUN: 17 mg/dL (ref 8–27)
CO2: 25 mmol/L (ref 20–29)
Calcium: 9 mg/dL (ref 8.6–10.2)
Chloride: 104 mmol/L (ref 96–106)
Creatinine, Ser: 1.07 mg/dL (ref 0.76–1.27)
GFR calc Af Amer: 83 mL/min/{1.73_m2} (ref >59)
GFR calc non Af Amer: 72 mL/min/{1.73_m2} (ref >59)
Glucose: 99 mg/dL (ref 65–99)
Potassium: 4.1 mmol/L (ref 3.5–5.2)
Sodium: 142 mmol/L (ref 134–144)

## 2019-06-17 ENCOUNTER — Telehealth: Payer: Self-pay | Admitting: *Deleted

## 2019-06-17 NOTE — Telephone Encounter (Signed)

## 2019-06-20 ENCOUNTER — Other Ambulatory Visit: Payer: Self-pay

## 2019-06-20 ENCOUNTER — Ambulatory Visit (INDEPENDENT_AMBULATORY_CARE_PROVIDER_SITE_OTHER)
Admission: RE | Admit: 2019-06-20 | Discharge: 2019-06-20 | Disposition: A | Discharge status: Home / Self Care | Payer: Medicare Other | Source: Ambulatory Visit | Attending: Cardiology | Admitting: Cardiology

## 2019-06-20 DIAGNOSIS — I712 Thoracic aortic aneurysm, without rupture: Secondary | ICD-10-CM

## 2019-06-20 DIAGNOSIS — I7121 Aneurysm of the ascending aorta, without rupture: Secondary | ICD-10-CM

## 2019-06-20 MED ORDER — IOHEXOL 350 MG/ML SOLN
100.0000 mL | Freq: Once | INTRAVENOUS | Status: AC | PRN
  Administered 2019-06-20: 100 mL via INTRAVENOUS

## 2019-06-21 ENCOUNTER — Telehealth: Payer: Self-pay | Admitting: Cardiology

## 2019-06-21 DIAGNOSIS — I712 Thoracic aortic aneurysm, without rupture, unspecified: Secondary | ICD-10-CM

## 2019-06-21 DIAGNOSIS — I255 Ischemic cardiomyopathy: Secondary | ICD-10-CM

## 2019-06-21 DIAGNOSIS — I7121 Aneurysm of the ascending aorta, without rupture: Secondary | ICD-10-CM

## 2019-06-21 NOTE — Telephone Encounter (Signed)
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 We will repeat in a year

## 2019-06-21 NOTE — Telephone Encounter (Signed)
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 Informed the pt that I will place the order in the system and send a message to our [facility] schedulers to arrange this study for one year out.  Pt verbalized understanding and agrees with this plan.

## 2019-06-21 NOTE — Telephone Encounter (Signed)
New Message ° ° ° °Patient calling for CT results. °

## 2019-06-30 IMAGING — NM NM MISC PROCEDURE
3 series · 18 of 18 positions shown · non-contrast
Comparison: none

[Series 1: rest_(id)_sa · 6.4mm · 6.40mm/px · 6 of 64 frames shown]
[frame 6/64]
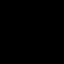
[frame 16/64]
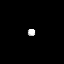
[frame 27/64]
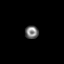
[frame 38/64]
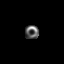
[frame 48/64]
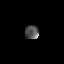
[frame 59/64]
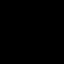

[Series 1: stress-gsp_(id)_sa · 6.4mm · 6.40mm/px · 6 of 512 frames shown]
[frame 43/512]
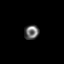
[frame 128/512]
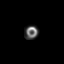
[frame 214/512]
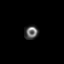
[frame 299/512]
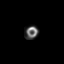
[frame 384/512]
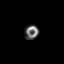
[frame 470/512]
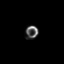

[Series 1: stress-sum-em_(id)_sa · 6.4mm · 6.40mm/px · 6 of 64 frames shown]
[frame 6/64]
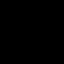
[frame 16/64]
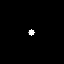
[frame 27/64]
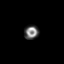
[frame 38/64]
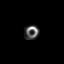
[frame 48/64]
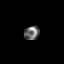
[frame 59/64]
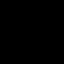

[18 of 18 positions shown; findings below may reference images not displayed]

Canned report from images found in remote index.

Refer to host system for actual result text.

## 2019-07-07 ENCOUNTER — Other Ambulatory Visit: Payer: Self-pay

## 2019-07-07 ENCOUNTER — Ambulatory Visit (INDEPENDENT_AMBULATORY_CARE_PROVIDER_SITE_OTHER): Payer: Medicare Other | Admitting: Family Medicine

## 2019-07-07 ENCOUNTER — Encounter: Payer: Self-pay | Admitting: Family Medicine

## 2019-07-07 ENCOUNTER — Ambulatory Visit: Payer: Medicare Other

## 2019-07-07 VITALS — BP 126/82 | HR 95 | Temp 98.1°F | Ht 75.0 in | Wt 217.3 lb

## 2019-07-07 DIAGNOSIS — Z23 Encounter for immunization: Secondary | ICD-10-CM | POA: Diagnosis not present

## 2019-07-07 DIAGNOSIS — I255 Ischemic cardiomyopathy: Secondary | ICD-10-CM | POA: Diagnosis not present

## 2019-07-07 DIAGNOSIS — S61419A Laceration without foreign body of unspecified hand, initial encounter: Secondary | ICD-10-CM

## 2019-07-07 DIAGNOSIS — T148XXA Other injury of unspecified body region, initial encounter: Secondary | ICD-10-CM

## 2019-07-07 NOTE — Patient Instructions (Signed)
Tetanus shot today.  Keep it clean and covered.  It doesn't look infected and should heal over.  You shouldn't have to start antibiotics at this point, given the timeline.  If the dog is acting abnormally, then go to the ER for rabies shots.  If you have any spreading redness, then let us know.  Take care.  Glad to see you.

## 2019-07-07 NOTE — Progress Notes (Signed)
He fosters dogs.  He was wiping off a foster dog after it had been out in the rain and bit him.  This was 07/03/2019.  The dog is up to date on vaccines and quarantined in the meantime and still acting normally.  The dog hasn't bitten anyone else.    This dog has been adopted in the meantime (to an owner who is familiar with dealing with these types of situations and is aware of recent history), with the quarantine pending.   Bitten on the R hand.  Patient didn't know if the towel was a trigger for the dog, ie if the towel scared the dog.    His R hand isn't painful.  Normal ROM.  No other lesions.  No FCNAVD.    Meds, vitals, and allergies reviewed.   ROS: Per HPI unless specifically indicated in ROS section   nad Normal R hand inspection expect for 2 small <<1cm abrasions that didn't appear to penetrate the skin and 1 V-shaped skin tear 5 x7 mm, all on the dorsum of the hand.  Not ttp.  No spreading redness.  Not bleeding, appears to be healing normally.  Distally NV intact.

## 2019-07-10 DIAGNOSIS — S61419A Laceration without foreign body of unspecified hand, initial encounter: Secondary | ICD-10-CM | POA: Insufficient documentation

## 2019-07-10 NOTE — Assessment & Plan Note (Signed)
Tetanus shot today.  Advised patient to keep it clean and covered.  It doesn't look infected and should heal over.  He shouldn't have to start antibiotics at this point, given the timeline.  If the dog is acting abnormally, then patient should go to the ER for rabies shots.  If any spreading redness, then let us know.  He agrees with plan.  Dog is quarantined in the meantime.

## 2019-07-11 DIAGNOSIS — T148XXA Other injury of unspecified body region, initial encounter: Secondary | ICD-10-CM | POA: Diagnosis not present

## 2019-07-11 DIAGNOSIS — W540XXA Bitten by dog, initial encounter: Secondary | ICD-10-CM | POA: Diagnosis not present

## 2019-07-11 DIAGNOSIS — Z23 Encounter for immunization: Secondary | ICD-10-CM | POA: Diagnosis not present

## 2019-07-11 DIAGNOSIS — S61411A Laceration without foreign body of right hand, initial encounter: Secondary | ICD-10-CM | POA: Diagnosis not present

## 2019-07-14 ENCOUNTER — Encounter: Payer: Self-pay | Admitting: Gastroenterology

## 2019-08-23 ENCOUNTER — Other Ambulatory Visit: Payer: Self-pay

## 2019-08-23 ENCOUNTER — Ambulatory Visit (INDEPENDENT_AMBULATORY_CARE_PROVIDER_SITE_OTHER): Payer: Medicare Other | Admitting: Family Medicine

## 2019-08-23 ENCOUNTER — Encounter: Payer: Self-pay | Admitting: Family Medicine

## 2019-08-23 VITALS — BP 110/82 | HR 83 | Temp 98.4°F | Ht 71.75 in | Wt 214.6 lb

## 2019-08-23 DIAGNOSIS — Z125 Encounter for screening for malignant neoplasm of prostate: Secondary | ICD-10-CM

## 2019-08-23 DIAGNOSIS — Z0000 Encounter for general adult medical examination without abnormal findings: Secondary | ICD-10-CM

## 2019-08-23 DIAGNOSIS — I255 Ischemic cardiomyopathy: Secondary | ICD-10-CM

## 2019-08-23 DIAGNOSIS — I2510 Atherosclerotic heart disease of native coronary artery without angina pectoris: Secondary | ICD-10-CM

## 2019-08-23 DIAGNOSIS — Z7189 Other specified counseling: Secondary | ICD-10-CM

## 2019-08-23 NOTE — Patient Instructions (Signed)
Thanks for your effort.  Go to the lab on the way out.  We'll contact you with your lab report. I would get a flu shot each fall.   Take care.  Glad to see you.  Update me as needed.  Keep seeing cardiology yearly.

## 2019-08-23 NOTE — Progress Notes (Signed)
I have personally reviewed the Medicare Annual Wellness questionnaire and have noted 1. The patient's medical and social history 2. Their use of alcohol, tobacco or illicit drugs 3. Their current medications and supplements 4. The patient's functional ability including ADL's, fall risks, home safety risks and hearing or visual             impairment. 5. Diet and physical activities 6. Evidence for depression or mood disorders  The patients weight, height, BMI have been recorded in the chart and visual acuity is per eye clinic.  I have made referrals, counseling and provided education to the patient based review of the above and I have provided the pt with a written personalized care plan for preventive services.  Provider list updated- see scanned forms.  Routine anticipatory guidance given to patient.  See health maintenance. The possibility exists that previously documented standard health maintenance information may have been brought forward from a previous encounter into this note.  If needed, that same information has been updated to reflect the current situation based on today's encounter.    Flu to be done at work Shingles up-to-date PNA discussed with patient.  Already had pneumonia 27. Tetanus 2020 Colon cancer screening discussed with patient.  He already got a letter about scheduling that. Prostate cancer screening pending.  Discussed with patient about pros and cons of screening. Advance directive-wife designated if patient were incapacitated. Cognitive function addressed- see scanned forms- and if abnormal then additional documentation follows.   Whisper hearing intact. EKG already on chart.  CAD.  Using medication without problems or lightheadedness: yes Chest pain with exertion:no Edema:no Short of breath:no Muscle aches: no Diet compliance: yes Exercise:yes Labs pending.    PMH and SH reviewed  Meds, vitals, and allergies reviewed.   ROS: Per HPI.  Unless  specifically indicated otherwise in HPI, the patient denies:  General: fever. Eyes: acute vision changes ENT: sore throat Cardiovascular: chest pain Respiratory: SOB GI: vomiting GU: dysuria Musculoskeletal: acute back pain Derm: acute rash Neuro: acute motor dysfunction Psych: worsening mood Endocrine: polydipsia Heme: bleeding Allergy: hayfever  GEN: nad, alert and oriented HEENT: ncat NECK: supple w/o LA CV: rrr. PULM: ctab, no inc wob ABD: soft, +bs EXT: no edema SKIN: no acute rash  Health Maintenance  Topic Date Due  . COLONOSCOPY  07/18/2019  . INFLUENZA VACCINE  07/23/2019  . PNA vac Low Risk Adult (2 of 2 - PCV13) 08/21/2019  . TETANUS/TDAP  07/10/2029  . Hepatitis C Screening  Completed

## 2019-08-24 LAB — CBC WITH DIFFERENTIAL/PLATELET
Basophils Absolute: 0.1 10*3/uL (ref 0.0–0.1)
Basophils Relative: 0.7 % (ref 0.0–3.0)
Eosinophils Absolute: 0.3 10*3/uL (ref 0.0–0.7)
Eosinophils Relative: 4.2 % (ref 0.0–5.0)
HCT: 44.2 % (ref 39.0–52.0)
Hemoglobin: 14.7 g/dL (ref 13.0–17.0)
Lymphocytes Relative: 17.5 % (ref 12.0–46.0)
Lymphs Abs: 1.4 10*3/uL (ref 0.7–4.0)
MCHC: 33.3 g/dL (ref 30.0–36.0)
MCV: 89 fl (ref 78.0–100.0)
Monocytes Absolute: 0.7 10*3/uL (ref 0.1–1.0)
Monocytes Relative: 8.4 % (ref 3.0–12.0)
Neutro Abs: 5.7 10*3/uL (ref 1.4–7.7)
Neutrophils Relative %: 69.2 % (ref 43.0–77.0)
Platelets: 203 10*3/uL (ref 150.0–400.0)
RBC: 4.97 Mil/uL (ref 4.22–5.81)
RDW: 13.9 % (ref 11.5–15.5)
WBC: 8.3 10*3/uL (ref 4.0–10.5)

## 2019-08-24 LAB — COMPREHENSIVE METABOLIC PANEL
ALT: 15 U/L (ref 0–53)
AST: 17 U/L (ref 0–37)
Albumin: 4.5 g/dL (ref 3.5–5.2)
Alkaline Phosphatase: 41 U/L (ref 39–117)
BUN: 22 mg/dL (ref 6–23)
CO2: 28 mEq/L (ref 19–32)
Calcium: 9.4 mg/dL (ref 8.4–10.5)
Chloride: 107 mEq/L (ref 96–112)
Creatinine, Ser: 1.21 mg/dL (ref 0.40–1.50)
GFR: 59.93 mL/min — ABNORMAL LOW (ref >60.00)
Glucose, Bld: 78 mg/dL (ref 70–99)
Potassium: 4.4 mEq/L (ref 3.5–5.1)
Sodium: 143 mEq/L (ref 135–145)
Total Bilirubin: 0.7 mg/dL (ref 0.2–1.2)
Total Protein: 6.8 g/dL (ref 6.0–8.3)

## 2019-08-24 LAB — PSA, MEDICARE: PSA: 2.09 ng/ml (ref 0.10–4.00)

## 2019-08-24 LAB — LIPID PANEL
Cholesterol: 153 mg/dL (ref 0–200)
HDL: 57 mg/dL (ref >39.00)
LDL Cholesterol: 81 mg/dL (ref 0–99)
NonHDL: 96.12
Total CHOL/HDL Ratio: 3
Triglycerides: 76 mg/dL (ref 0.0–149.0)
VLDL: 15.2 mg/dL (ref 0.0–40.0)

## 2019-08-24 NOTE — Assessment & Plan Note (Signed)
Advance directive- wife designated if patient were incapacitated.  

## 2019-08-24 NOTE — Assessment & Plan Note (Signed)
No chest pain or shortness of breath.  No change in meds at this point.  Labs pending.  See notes on labs.  Continue work on diet and exercise.

## 2019-08-24 NOTE — Assessment & Plan Note (Signed)
Flu to be done at work Shingles up-to-date PNA discussed with patient.  Already had pneumonia 59. Tetanus 2020 Colon cancer screening discussed with patient.  He already got a letter about scheduling that. Prostate cancer screening pending.  Discussed with patient about pros and cons of screening. Advance directive-wife designated if patient were incapacitated. Cognitive function addressed- see scanned forms- and if abnormal then additional documentation follows.

## 2019-12-09 ENCOUNTER — Other Ambulatory Visit: Payer: Self-pay | Admitting: Cardiology

## 2020-02-14 DIAGNOSIS — Z23 Encounter for immunization: Secondary | ICD-10-CM | POA: Diagnosis not present

## 2020-02-15 ENCOUNTER — Encounter: Payer: Self-pay | Admitting: Cardiology

## 2020-02-15 ENCOUNTER — Other Ambulatory Visit: Payer: Self-pay

## 2020-02-15 ENCOUNTER — Ambulatory Visit (INDEPENDENT_AMBULATORY_CARE_PROVIDER_SITE_OTHER): Payer: Medicare Other | Admitting: Cardiology

## 2020-02-15 VITALS — BP 130/84 | HR 88 | Ht 71.0 in | Wt 216.0 lb

## 2020-02-15 DIAGNOSIS — I7121 Aneurysm of the ascending aorta, without rupture: Secondary | ICD-10-CM

## 2020-02-15 DIAGNOSIS — I712 Thoracic aortic aneurysm, without rupture: Secondary | ICD-10-CM

## 2020-02-15 MED ORDER — ATORVASTATIN CALCIUM 80 MG PO TABS: ORAL_TABLET | ORAL | 0 refills | Status: DC

## 2020-02-15 NOTE — Patient Instructions (Signed)
Medication Instructions:  Your physician recommends that you continue on your current medications as directed. Please refer to the Current Medication list given to you today.  *If you need a refill on your cardiac medications before your next appointment, please call your pharmacy*  Lab Work: None If you have labs (blood work) drawn today and your tests are completely normal, you will receive your results only by: Marland Kitchen MyChart Message (if you have MyChart) OR . A paper copy in the mail If you have any lab test that is abnormal or we need to change your treatment, we will call you to review the results.  Testing/Procedures: None  Follow-Up: At [facility], you and your health needs are our priority.  As part of our continuing mission to provide you with exceptional heart care, we have created designated Provider Care Teams.  These Care Teams include your primary Cardiologist (physician) and Advanced Practice Providers (APPs -  Physician Assistants and Nurse Practitioners) who all work together to provide you with the care you need, when you need it.  Your next appointment:   1 year(s)  The format for your next appointment:   In Person  Provider:   Tobias Alexander, MD  Other Instructions

## 2020-02-15 NOTE — Progress Notes (Signed)
Cardiology Office Note    Date:  02/15/2020   ID:  Randy Pittman, DOB 06-30-1951, MRN [id number]  PCP:  [name], MD  Cardiologist: Dr. [name]  Reason for visit: 1 year follow up  History of Present Illness:  Randy Pittman is a 69 y.o. male with history of CAD after undergoing lifestyle screening and noted to have PVCs.  He was asymptomatic.  His twin brother has significant ischemic cardiomyopathy.  Abnormal nuclear stress test 12/27/15 showed moderate appearing inferior wall ischemia from the apex to the base and diffuse hypokinesis is worse in the apex EF 43%.  Left heart cath showed significant 2 vessel CAD with subtotal 99% OM1 and 65% mid RCA and total occlusion of the PLA with collaterals he had DES to the circumflex marginal and RCA may need stenting in the future if it progresses.  He had mild LV dysfunction ejection fraction 50%.  Also has hypertension and HLD.  Last seen by Dr. [name] 04/2017 and doing well.  11/09/17 - Patient comes in today for follow-up accompanied by his wife.  He works 12-hour days and does yard work but does not get any regular activity.  He denies any chest pain, palpitations, dyspnea, dyspnea on exertion, dizziness.  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  He checks his blood pressure frequently at work and at home and it is always at goal.  He has been experiencing a lot of bruising with Brilinta also his co-pay significantly high so he would like to discontinue if it safe.  He is tolerating atorvastatin well.  01/03/2019 -this is 6 months follow-up, the patient states that he feels great, he continues to work 12-hour shifts walks on average 10-14,000 steps a day and has no symptoms.  He is compliant with his medications and has no side  effects.  02/15/2020, the patient is coming after 1 year, he continues to work, he walks a lot at work and has no symptoms of chest pain or shortness of breath, denies any palpitations other than occasional skipped beats, no lower extremity edema orthopnea or proximal nocturnal dyspnea.  He has been compliant with his medications and has no side effects.  Past Medical History:  Diagnosis Date  . CAD (coronary artery disease)   . Hyperlipidemia 07/1995  . Low testosterone    per Dr. Quillian Quince, with urology    Past Surgical History:  Procedure Laterality Date  . CARDIAC CATHETERIZATION N/A 01/01/2017   Procedure: Left Heart Cath and Coronary Angiography;  Surgeon: Troy Sine, MD;  Location: [facility];  Service: Cardiovascular;  Laterality: N/A;  . CARDIAC CATHETERIZATION N/A 01/01/2017   Procedure: Coronary Stent Intervention;  Surgeon: Troy Sine, MD;  Location: [facility];  Service: Cardiovascular;  Laterality: N/A;  . COLONOSCOPY    . CORONARY ANGIOPLASTY WITH STENT PLACEMENT  01/01/2017    Current Medications: Current Meds  Medication Sig  . aspirin 81 MG tablet Take 81 mg by mouth daily.    Marland Kitchen atorvastatin (LIPITOR) 80 MG tablet TAKE 1 TABLET DAILY AT 6 P.M.  . cholecalciferol (VITAMIN D) 1000 UNITS tablet Take 1,000 Units by mouth daily.  . metoprolol succinate (TOPROL-XL) 25 MG 24 hr tablet TAKE 1 TABLET EVERY EVENING  . nitroGLYCERIN (NITROSTAT) 0.4 MG SL  tablet Place 1 tablet (0.4 mg total) under the tongue every 5 (five) minutes as needed for chest pain.  . vitamin B-12 (CYANOCOBALAMIN) 500 MCG tablet Take 500 mcg by mouth daily.  . [DISCONTINUED] atorvastatin (LIPITOR) 80 MG tablet TAKE 1 TABLET DAILY AT 6 P.M.     Allergies:   Patient has no known allergies.   Social History   Socioeconomic History  . Marital status: Married    Spouse name: Not on file  . Number of children: Not on file  . Years of education: Not on file  . Highest education  level: Not on file  Occupational History  . Occupation: Scientific laboratory technician [employer]  Tobacco Use  . Smoking status: Former Smoker    Packs/day: 2.00    Years: 5.00    Pack years: 10.00    Types: Cigarettes    Quit date: 1980    Years since quitting: 41.1  . Smokeless tobacco: Former Neurosurgeon    Types: Chew    Quit date: 1975  Substance and Sexual Activity  . Alcohol use: Yes    Alcohol/week: 4.0 standard drinks    Types: 2 Glasses of wine, 2 Cans of beer per week  . Drug use: No  . Sexual activity: Not on file  Other Topics Concern  . 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 Difficulty of Paying Living Expenses: Not on file  Food Insecurity:   . Worried About Programme researcher, broadcasting/film/video in the Last Year: Not on file  . Ran Out of Food in the Last Year: Not on file  Transportation Needs:   . Lack of Transportation (Medical): Not on file  . Lack of Transportation (Non-Medical): Not on file  Physical Activity:   . Days of Exercise per Week: Not on file  . Minutes of Exercise per Session: Not on file  Stress:   . Feeling of Stress : Not on file  Social Connections:   . Frequency of Communication with Friends and Family: Not on file  . Frequency of Social Gatherings with Friends and Family: Not on file  . Attends Religious Services: Not on file  . Active Member of Clubs or Organizations: Not on file  . Attends Banker Meetings: Not on file  . Marital Status: Not on file     Family History:  The patient's   family history includes Cirrhosis in his mother; Colon cancer in an other family member; Colon polyps in his brother; Diabetes in his mother; Heart disease in his brother; Hypertension in his mother; Parkinsonism in his father; Prostate cancer in an other family member.   ROS:   Please  see the history of present illness.    Review of Systems  Constitution: Negative.  HENT: Negative.   Cardiovascular: Negative.   Respiratory: Negative.   Endocrine: Negative.   Hematologic/Lymphatic: Negative.   Musculoskeletal: Negative.   Gastrointestinal: Negative.   Genitourinary: Negative.   Neurological: Negative.    All other systems reviewed and are negative.  PHYSICAL EXAM:   VS:  BP 130/84   Pulse 88   Ht 5\' 11"  (1.803 m)   Wt 216 lb (98 kg)   SpO2 97%   BMI 30.13 kg/m   Physical Exam  GEN:  Well nourished, well developed, in no acute distress  Neck: no JVD, carotid bruits, or masses Cardiac:RRR; no murmurs, rubs, or gallops  Respiratory:  clear to auscultation bilaterally, normal work of breathing GI: soft, nontender, nondistended, + BS Ext: without cyanosis, clubbing, or edema, Good distal pulses bilaterally Neuro:  Alert and Oriented x 3 Psych: euthymic mood, full affect  Wt Readings from Last 3 Encounters:  02/15/20 216 lb (98 kg)  08/23/19 214 lb 9 oz (97.3 kg)  07/07/19 217 lb 5 oz (98.6 kg)    Studies/Labs Reviewed:   EKG:  EKG is ordered today.  It shows normal sinus rhythm with left axis deviation, inferior infarct age undetermined, this is unchanged from prior and personally reviewed.  Recent Labs: 08/23/2019: ALT 15; BUN 22; Creatinine, Ser 1.21; Hemoglobin 14.7; Platelets 203.0; Potassium 4.4; Sodium 143   Lipid Panel    Component Value Date/Time   CHOL 153 08/23/2019 1616   CHOL 130 11/09/2017 0939   TRIG 76.0 08/23/2019 1616   HDL 57.00 08/23/2019 1616   HDL 61 11/09/2017 0939   CHOLHDL 3 08/23/2019 1616   VLDL 15.2 08/23/2019 1616   LDLCALC 81 08/23/2019 1616   LDLCALC 58 11/09/2017 0939   LDLDIRECT 169.4 07/09/2012 1021   Additional studies/ records that were reviewed today include:   Cardiac catheterization 01/01/17 Procedures    Coronary Stent Intervention  Left Heart Cath and Coronary Angiography  Conclusion       There is  mild left ventricular systolic dysfunction.  Dist RCA lesion, 100 %stenosed.  Mid RCA lesion, 65 %stenosed.  A STENT SYNERGY DES 2.5X28 drug eluting stent was successfully placed.  Lat 1st Mrg lesion, 99 %stenosed.  Post intervention, there is a 0% residual stenosis.   Low-normal to mild LV dysfunction with an ejection fraction of 50% with a small focal region of distal inferior hypocontractility.   Significant 2 vessel coronary obstructive disease with a subtotal 99 100% stenosis involving the inferior branch of the obtuse marginal 1 vessel; and 65% mid RCA stenosis proximal to the acute margin with total occlusion of the PLA with retrograde collateralization to the distal PLA via the AV groove circumflex branches.  LAD with mild luminal irregularity.   Successful PCI to the circumflex marginal vessel with ultimate insertion of a Synergy DES 2.528 mm stent postdilated 2.75 mm with the 99% stenosis reduced to 0% and resumption of brisk TIMI-3 flow    2-D echo 12/26/16   Study Conclusions   - Left ventricle: The cavity size was normal. Wall thickness was   increased in a pattern of mild LVH. Systolic function was mildly   to moderately reduced. The estimated ejection fraction was in the   range of 40% to 45%. Moderate hypokinesis of the   mid-apicalinferoseptal myocardium. Doppler parameters are   consistent with abnormal left ventricular relaxation (grade 1   diastolic dysfunction). - Aortic valve: There was trivial regurgitation. - Right atrium: The atrium was mildly dilated.   TTE: 05/01/2017 - Left ventricle: The cavity size was normal. Wall thickness was   increased in a pattern of mild LVH. There was moderate concentric   hypertrophy. Systolic function was normal. The estimated ejection   fraction was in the range of 50% to 55%. Diffuse hypokinesis.   Doppler parameters are consistent with abnormal left ventricular   relaxation (grade 1 diastolic dysfunction). - Aortic valve:  There was mild regurgitation. - Aorta: Ascending aortic diameter: 42 mm (S). - Ascending aorta: The ascending aorta  was mildly dilated. - Mitral valve: Calcified annulus. Mildly thickened, mildly   calcified leaflets . - Left atrium: The atrium was mildly dilated.   Nuclear stress test 12/26/16 Study Highlights     Nuclear stress EF: 43%.  Findings consistent with ischemia.  This is an intermediate risk study.  The left ventricular ejection fraction is moderately decreased (30-44%).  Upsloping ST segment depression ST segment depression of 1 mm was noted during stress in the I, aVL, V5 and V6 leads.   Moderate appearing inferior wall ischemia from apex to base Diffuse hypokinesis worse in apex EF 43%      EKG performed today June 25, 2018 shows normal sinus rhythm left axis deviation, otherwise normal EKG unchanged from prior.  This was personally reviewed.   ASSESSMENT:    1. Ascending aortic aneurysm (HCC)    PLAN:  In order of problems listed above:  CAD status post DES to the circumflex marginal with residual RCA disease 12/2016.  The patient is asymptomatic, LVEF improved from 40% to 55 to 60%.  We will continue current management.  His EKG has not changed.  Ischemic cardiomyopathy ejection fraction improved t from 40% to 55 to 60% on the echocardiogram in June 2019.  Hyperlipidemia on Lipitor - all lipids at goal and normal LFTs in June 2020.  Ascending aortic aneurysm, with maximal diameter 44 mm on the echo in 2019, chest CTA in June 2020 showed stable size of the ascending aorta measuring 42 mm.  He is scheduled for follow-up chest CTA 05/2020.  Follow-up in 1 year.  Medication Adjustments/Labs and Tests Ordered: Current medicines are reviewed at length with the patient today.  Concerns regarding medicines are outlined above.  Medication changes, Labs and Tests ordered today are listed in the Patient Instructions below. Patient Instructions  Medication Instructions:   Your physician recommends that you continue on your current medications as directed. Please refer to the Current Medication list given to you today.  *If you need a refill on your cardiac medications before your next appointment, please call your pharmacy*  Lab Work: None If you have labs (blood work) drawn today and your tests are completely normal, you will receive your results only by: Marland Kitchen MyChart Message (if you have MyChart) OR . A paper copy in the mail If you have any lab test that is abnormal or we need to change your treatment, we will call you to review the results.  Testing/Procedures: None  Follow-Up: At [facility], you and your health needs are our priority.  As part of our continuing mission to provide you with exceptional heart care, we have created designated Provider Care Teams.  These Care Teams include your primary Cardiologist (physician) and Advanced Practice Providers (APPs -  Physician Assistants and Nurse Practitioners) who all work together to provide you with the care you need, when you need it.  Your next appointment:   1 year(s)  The format for your next appointment:   In Person  Provider:   Tobias Alexander, MD  Other Instructions      Signed, Tobias Alexander, MD  02/15/2020 9:05 AM    [facility] [address] [place], [place], [place]  [address] Phone: [phone]; Fax: [phone]

## 2020-02-16 ENCOUNTER — Ambulatory Visit: Payer: Medicare Other | Admitting: Cardiology

## 2020-02-26 ENCOUNTER — Other Ambulatory Visit: Payer: Self-pay | Admitting: Cardiology

## 2020-04-02 ENCOUNTER — Telehealth: Payer: Self-pay | Admitting: Family Medicine

## 2020-04-02 NOTE — Telephone Encounter (Signed)
Got patient scheduled for CPE and labs. 

## 2020-04-03 NOTE — Telephone Encounter (Signed)
Thanks

## 2020-05-08 ENCOUNTER — Other Ambulatory Visit: Payer: Self-pay | Admitting: Cardiology

## 2020-06-11 ENCOUNTER — Telehealth: Payer: Self-pay | Admitting: *Deleted

## 2020-06-11 DIAGNOSIS — I712 Thoracic aortic aneurysm, without rupture, unspecified: Secondary | ICD-10-CM

## 2020-06-11 DIAGNOSIS — I7121 Aneurysm of the ascending aorta, without rupture: Secondary | ICD-10-CM

## 2020-06-11 NOTE — Addendum Note (Signed)
Addended by: Loa Socks on: 06/11/2020 04:12 PM   Modules accepted: Orders

## 2020-06-11 NOTE — Telephone Encounter (Signed)
Per [facility] scheduling, pt will need BMET prior to CT Angio, per protocol. Order for BMET placed and [facility] will call to arrange this appt as well as order for him to have his CT Angio done at the hospital vs GI now, for GI is booked up until early July.

## 2020-06-11 NOTE — Telephone Encounter (Signed)
Good Morning... this pt is scheduled for the 30th of this month. I see that he was scheduled a while ago but however he has MCR and we can't scan for [facility] at the moment. He would have to go to [facility] or one of the hospitals. Sorry for the inconvenience   Message above was received by our CT dept, stating that the pt has Medicare and cannot get his yearly CT Angio done here in the office, this will have to be done at [facility] for coverage. New order placed and [facility] to call the pt and arrange this test at covered location. [facility] Scheduling will call the pt to arrange this.

## 2020-06-13 NOTE — Telephone Encounter (Signed)
Pt is scheduled for lab on 6/25 and his CT Angio Chest Aorta is scheduled for 6/29 at 3:30 pm at [facility].  Pt made aware of appt date and time by [facility] scheduling.

## 2020-06-15 ENCOUNTER — Other Ambulatory Visit: Payer: Medicare Other | Admitting: *Deleted

## 2020-06-15 ENCOUNTER — Other Ambulatory Visit: Payer: Self-pay

## 2020-06-15 DIAGNOSIS — I712 Thoracic aortic aneurysm, without rupture, unspecified: Secondary | ICD-10-CM

## 2020-06-15 DIAGNOSIS — I7121 Aneurysm of the ascending aorta, without rupture: Secondary | ICD-10-CM

## 2020-06-16 LAB — BASIC METABOLIC PANEL
BUN/Creatinine Ratio: 20 (ref 10–24)
BUN: 21 mg/dL (ref 8–27)
CO2: 23 mmol/L (ref 20–29)
Calcium: 9.1 mg/dL (ref 8.6–10.2)
Chloride: 104 mmol/L (ref 96–106)
Creatinine, Ser: 1.04 mg/dL (ref 0.76–1.27)
GFR calc Af Amer: 85 mL/min/{1.73_m2} (ref >59)
GFR calc non Af Amer: 74 mL/min/{1.73_m2} (ref >59)
Glucose: 83 mg/dL (ref 65–99)
Potassium: 4 mmol/L (ref 3.5–5.2)
Sodium: 143 mmol/L (ref 134–144)

## 2020-06-19 ENCOUNTER — Other Ambulatory Visit: Payer: Self-pay

## 2020-06-19 ENCOUNTER — Ambulatory Visit (HOSPITAL_COMMUNITY)
Admission: RE | Admit: 2020-06-19 | Discharge: 2020-06-19 | Disposition: A | Discharge status: Home / Self Care | Payer: Medicare Other | Source: Ambulatory Visit | Attending: Cardiology | Admitting: Cardiology

## 2020-06-19 DIAGNOSIS — I712 Thoracic aortic aneurysm, without rupture, unspecified: Secondary | ICD-10-CM

## 2020-06-19 DIAGNOSIS — I7121 Aneurysm of the ascending aorta, without rupture: Secondary | ICD-10-CM

## 2020-06-19 DIAGNOSIS — I2510 Atherosclerotic heart disease of native coronary artery without angina pectoris: Secondary | ICD-10-CM | POA: Diagnosis not present

## 2020-06-19 DIAGNOSIS — I700 Atherosclerosis of aorta: Secondary | ICD-10-CM | POA: Diagnosis not present

## 2020-06-19 DIAGNOSIS — I517 Cardiomegaly: Secondary | ICD-10-CM | POA: Diagnosis not present

## 2020-06-19 MED ORDER — IOHEXOL 350 MG/ML SOLN
75.0000 mL | Freq: Once | INTRAVENOUS | Status: AC | PRN
  Administered 2020-06-19: 75 mL via INTRAVENOUS

## 2020-06-20 ENCOUNTER — Inpatient Hospital Stay: Admission: RE | Admit: 2020-06-20 | Payer: Medicare Other | Source: Ambulatory Visit

## 2020-06-20 ENCOUNTER — Telehealth: Payer: Self-pay | Admitting: *Deleted

## 2020-06-20 DIAGNOSIS — I7121 Aneurysm of the ascending aorta, without rupture: Secondary | ICD-10-CM

## 2020-06-20 DIAGNOSIS — I712 Thoracic aortic aneurysm, without rupture, unspecified: Secondary | ICD-10-CM

## 2020-06-20 NOTE — Telephone Encounter (Signed)
Spoke with the pt and informed him that per Dr. Delton See, his CT Angio showed stable ascending thoracic aortic aneurysm measuring approximately 4.2 cm, and she recommends annual imaging followup by CTA.  Informed the pt that I will place an order for him to have another CT Angio Chest Aorta in the system to have done in one year.  Informed the pt that a message will be sent to our [facility] schedulers to call him back and arrange this for one year out.  Pt verbalized understanding and agrees with this plan.

## 2020-06-20 NOTE — Telephone Encounter (Signed)
-----   Message from [name], MD sent at 06/20/2020 11:37 AM EDT ----- Stable ascending thoracic aortic aneurysm measuring approximately 4.2 cm. Recommend annual imaging followup by CTA or MRA.

## 2020-06-26 ENCOUNTER — Telehealth: Payer: Self-pay | Admitting: *Deleted

## 2020-06-26 DIAGNOSIS — I712 Thoracic aortic aneurysm, without rupture, unspecified: Secondary | ICD-10-CM

## 2020-06-26 DIAGNOSIS — Z0189 Encounter for other specified special examinations: Secondary | ICD-10-CM

## 2020-06-26 DIAGNOSIS — I7121 Aneurysm of the ascending aorta, without rupture: Secondary | ICD-10-CM

## 2020-06-26 NOTE — Telephone Encounter (Signed)
Loa Socks, LPN  03/24/5955 3:87 AM EDT Back to Top    repeat CT Angio Chest Aorta to be done for ONE YEAR out per Dr. Delton See Received: 5 days ago [name], LPN Patient is aware 06-19-21 @ [facility] / will need order for bmet /lab schedule 06-12-21    Loa Socks, LPN  5/64/3329 5:18 PM EDT     Spoke with the pt and informed him that per Dr. Delton See, his CT Angio showed stable ascending thoracic aortic aneurysm measuring approximately 4.2 cm, and she recommends annual imaging followup by CTA.  Informed the pt that I will place an order for him to have another CT Angio Chest Aorta in the system to have done in one year. Informed the pt that a message will be sent to our [facility] schedulers to call him back and arrange this for one year out.  Pt verbalized understanding and agrees with this plan.   [name], MD  06/20/2020 11:37 AM EDT     Stable ascending thoracic aortic aneurysm measuring approximately 4.2 cm. Recommend annual imaging followup by CTA or MRA.     BMET placed for the pt to have done on 06/12/21 prior to scheduled CT Angio Chest Aorta, on 06/19/21. Pt made aware by [facility] scheduling.

## 2020-06-26 NOTE — Telephone Encounter (Signed)
-----   Message from Katarina H Nelson, MD sent at 06/20/2020 11:37 AM EDT ----- Stable ascending thoracic aortic aneurysm measuring approximately 4.2 cm. Recommend annual imaging followup by CTA or MRA. 

## 2020-08-11 ENCOUNTER — Other Ambulatory Visit: Payer: Self-pay | Admitting: Family Medicine

## 2020-08-11 DIAGNOSIS — I2510 Atherosclerotic heart disease of native coronary artery without angina pectoris: Secondary | ICD-10-CM

## 2020-08-11 DIAGNOSIS — Z125 Encounter for screening for malignant neoplasm of prostate: Secondary | ICD-10-CM

## 2020-08-22 ENCOUNTER — Other Ambulatory Visit: Payer: Self-pay

## 2020-08-22 ENCOUNTER — Other Ambulatory Visit (INDEPENDENT_AMBULATORY_CARE_PROVIDER_SITE_OTHER): Payer: Medicare Other

## 2020-08-22 ENCOUNTER — Other Ambulatory Visit: Payer: Self-pay | Admitting: Family Medicine

## 2020-08-22 ENCOUNTER — Telehealth: Payer: Self-pay | Admitting: *Deleted

## 2020-08-22 DIAGNOSIS — I7121 Aneurysm of the ascending aorta, without rupture: Secondary | ICD-10-CM

## 2020-08-22 DIAGNOSIS — I2510 Atherosclerotic heart disease of native coronary artery without angina pectoris: Secondary | ICD-10-CM

## 2020-08-22 DIAGNOSIS — Z125 Encounter for screening for malignant neoplasm of prostate: Secondary | ICD-10-CM | POA: Diagnosis not present

## 2020-08-22 DIAGNOSIS — Z0189 Encounter for other specified special examinations: Secondary | ICD-10-CM

## 2020-08-22 DIAGNOSIS — I712 Thoracic aortic aneurysm, without rupture, unspecified: Secondary | ICD-10-CM

## 2020-08-22 LAB — CBC WITH DIFFERENTIAL/PLATELET
Basophils Absolute: 0 10*3/uL (ref 0.0–0.1)
Basophils Relative: 0.6 % (ref 0.0–3.0)
Eosinophils Absolute: 0.3 10*3/uL (ref 0.0–0.7)
Eosinophils Relative: 5.6 % — ABNORMAL HIGH (ref 0.0–5.0)
HCT: 42.7 % (ref 39.0–52.0)
Hemoglobin: 14.2 g/dL (ref 13.0–17.0)
Lymphocytes Relative: 20.5 % (ref 12.0–46.0)
Lymphs Abs: 1.2 10*3/uL (ref 0.7–4.0)
MCHC: 33.1 g/dL (ref 30.0–36.0)
MCV: 88.9 fl (ref 78.0–100.0)
Monocytes Absolute: 0.6 10*3/uL (ref 0.1–1.0)
Monocytes Relative: 9.9 % (ref 3.0–12.0)
Neutro Abs: 3.6 10*3/uL (ref 1.4–7.7)
Neutrophils Relative %: 63.4 % (ref 43.0–77.0)
Platelets: 186 10*3/uL (ref 150.0–400.0)
RBC: 4.8 Mil/uL (ref 4.22–5.81)
RDW: 13.9 % (ref 11.5–15.5)
WBC: 5.7 10*3/uL (ref 4.0–10.5)

## 2020-08-22 LAB — COMPREHENSIVE METABOLIC PANEL
ALT: 16 U/L (ref 0–53)
AST: 16 U/L (ref 0–37)
Albumin: 4.6 g/dL (ref 3.5–5.2)
Alkaline Phosphatase: 39 U/L (ref 39–117)
BUN: 21 mg/dL (ref 6–23)
CO2: 28 mEq/L (ref 19–32)
Calcium: 9.4 mg/dL (ref 8.4–10.5)
Chloride: 102 mEq/L (ref 96–112)
Creatinine, Ser: 1.04 mg/dL (ref 0.40–1.50)
GFR: 71.15 mL/min (ref >60.00)
Glucose, Bld: 85 mg/dL (ref 70–99)
Potassium: 4.1 mEq/L (ref 3.5–5.1)
Sodium: 140 mEq/L (ref 135–145)
Total Bilirubin: 0.7 mg/dL (ref 0.2–1.2)
Total Protein: 6.6 g/dL (ref 6.0–8.3)

## 2020-08-22 LAB — LIPID PANEL
Cholesterol: 154 mg/dL (ref 0–200)
HDL: 61.7 mg/dL (ref >39.00)
LDL Cholesterol: 83 mg/dL (ref 0–99)
NonHDL: 91.86
Total CHOL/HDL Ratio: 2
Triglycerides: 45 mg/dL (ref 0.0–149.0)
VLDL: 9 mg/dL (ref 0.0–40.0)

## 2020-08-22 LAB — PSA, MEDICARE: PSA: 1.48 ng/ml (ref 0.10–4.00)

## 2020-08-22 NOTE — Telephone Encounter (Signed)
BMET cancelled per lab request.  Reordered for pt to have done prior to his CT Angio, for 06/12/2021.  CT Angio will be on 06/19/21.

## 2020-08-22 NOTE — Telephone Encounter (Signed)
-----   Message from Aquilla Solian, RT sent at 08/22/2020  9:18 AM EDT ----- Regarding: RE: Lab Error The BMET please and reorder if your office will still want it, it is linked to an appt in 2022 so it would not let me cancel it :/  Thank you!  Happy Humpday! Almost the weekend! ----- Message ----- From: Loa Socks, LPN Sent: 07/27/7618   9:09 AM EDT To: Aquilla Solian, RT Subject: FW: Lab Error                                  Which lab order should I cancel?  CMET or BMET?  Sorry I am a bit confused. LOL!  Lajoyce Corners  ----- Message ----- From: Aquilla Solian, RT Sent: 08/22/2020   8:40 AM EDT To: Loa Socks, LPN Subject: Lab Error                                      Apologies but we accidentally released an order for a BMET that was from  your office.  We did however draw a CMET so results on that should be released today. If you could cancel the order that would be great, it will not let us.  Thank you!  So sorry! Thanks  [place]

## 2020-08-22 NOTE — Addendum Note (Signed)
Addended by: Aquilla Solian on: 08/22/2020 10:03 AM   Modules accepted: Orders

## 2020-08-23 ENCOUNTER — Ambulatory Visit: Payer: Medicare Other

## 2020-08-28 ENCOUNTER — Other Ambulatory Visit: Payer: Self-pay

## 2020-08-28 ENCOUNTER — Ambulatory Visit (INDEPENDENT_AMBULATORY_CARE_PROVIDER_SITE_OTHER): Payer: Medicare Other

## 2020-08-28 DIAGNOSIS — Z0000 Encounter for general adult medical examination without abnormal findings: Secondary | ICD-10-CM | POA: Diagnosis not present

## 2020-08-28 NOTE — Progress Notes (Signed)
Subjective:   Randy Pittman is a 69 y.o. male who presents for Medicare Annual/Subsequent preventive examination.  Review of Systems: N/A      I connected with the patient today by telephone and verified that I am speaking with the correct person using two identifiers. Location patient: home Location nurse: work Persons participating in the telephone visit: patient, nurse.   I discussed the limitations, risks, security and privacy concerns of performing an evaluation and management service by telephone and the availability of in person appointments. I also discussed with the patient that there may be a patient responsible charge related to this service. The patient expressed understanding and verbally consented to this telephonic visit.        Cardiac Risk Factors include: advanced age (>17men, >71 women);male gender;dyslipidemia     Objective:    Today's Vitals   There is no height or weight on file to calculate BMI.  Advanced Directives 08/28/2020 01/01/2017 01/01/2017  Does Patient Have a Medical Advance Directive? Yes Yes Yes  Type of Estate agent of [place];Living will Living will -  Does patient want to make changes to medical advance directive? - No - Patient declined -  Copy of Healthcare Power of Attorney in Chart? No - copy requested - -    Current Medications (verified) Outpatient Encounter Medications as of 08/28/2020  Medication Sig  . aspirin 81 MG tablet Take 81 mg by mouth daily.    Marland Kitchen atorvastatin (LIPITOR) 80 MG tablet TAKE 1 TABLET DAILY AT 6 P.M.  . cholecalciferol (VITAMIN D) 1000 UNITS tablet Take 1,000 Units by mouth daily.  . metoprolol succinate (TOPROL-XL) 25 MG 24 hr tablet TAKE 1 TABLET EVERY EVENING  . nitroGLYCERIN (NITROSTAT) 0.4 MG SL tablet Place 1 tablet (0.4 mg total) under the tongue every 5 (five) minutes as needed for chest pain.  . vitamin B-12 (CYANOCOBALAMIN) 500 MCG tablet Take 500 mcg by mouth daily.   No  facility-administered encounter medications on file as of 08/28/2020.    Allergies (verified) Patient has no known allergies.   History: Past Medical History:  Diagnosis Date  . CAD (coronary artery disease)   . Hyperlipidemia 07/1995  . Low testosterone    per Dr. Reuel Boom, with urology   Past Surgical History:  Procedure Laterality Date  . CARDIAC CATHETERIZATION N/A 01/01/2017   Procedure: Left Heart Cath and Coronary Angiography;  Surgeon: Lennette Bihari, MD;  [facility] INVASIVE CV LAB;  Service: Cardiovascular;  Laterality: N/A;  . CARDIAC CATHETERIZATION N/A 01/01/2017   Procedure: Coronary Stent Intervention;  Surgeon: Lennette Bihari, MD;  Location: [facility] INVASIVE CV LAB;  Service: Cardiovascular;  Laterality: N/A;  . COLONOSCOPY    . CORONARY ANGIOPLASTY WITH STENT PLACEMENT  01/01/2017   Family History  Problem Relation Age of Onset  . Diabetes Mother        kidney removal  . Hypertension Mother   . Cirrhosis Mother   . Heart disease Brother        CABG, long time smoker, now quit, pacer/defib placement  . Colon polyps Brother   . Parkinsonism Father   . Prostate cancer Other        great uncle  . Colon cancer Other        great aunt   Social History   Socioeconomic History  . Marital status: Married    Spouse name: Not on file  . Number of children: Not on file  . Years of education:  Not on file  . Highest education level: Not on file  Occupational History  . Occupation: Scientific laboratory technician [employer]  Tobacco Use  . Smoking status: Former Smoker    Packs/day: 2.00    Years: 5.00    Pack years: 10.00    Types: Cigarettes    Quit date: 1980    Years since quitting: 41.7  . Smokeless tobacco: Former Neurosurgeon    Types: Chew    Quit date: Photographer  . Vaping Use: Never used  Substance and Sexual Activity  . Alcohol use: Yes    Alcohol/week: 4.0 standard drinks    Types: 2 Glasses of wine, 2 Cans of beer per week  . Drug use: No  . Sexual activity: Not on file    Other Topics Concern  . 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 Difficulty of Paying Living Expenses: Not hard at all  Food Insecurity: No Food Insecurity  . Worried About Programme researcher, broadcasting/film/video in the Last Year: Never true  . Ran Out of Food in the Last Year: Never true  Transportation Needs: No Transportation Needs  . Lack of Transportation (Medical): No  . Lack of Transportation (Non-Medical): No  Physical Activity: Sufficiently Active  . Days of Exercise per Week: 7 days  . Minutes of Exercise per Session: 60 min  Stress: No Stress Concern Present  . Feeling of Stress : Not at all  Social Connections:   . Frequency of Communication with Friends and Family: Not on file  . Frequency of Social Gatherings with Friends and Family: Not on file  . Attends Religious Services: Not on file  . Active Member of Clubs or Organizations: Not on file  . Attends Banker Meetings: Not on file  . Marital Status: Not on file    Tobacco Counseling Counseling given: Not Answered   Clinical Intake:  Pre-visit preparation completed: Yes  Pain : No/denies pain     Nutritional Risks: None Diabetes: No  How often do you need to have someone help you when you read instructions, pamphlets, or other written materials from your doctor or pharmacy?: 1 - Never What is the last grade level you completed in school?: 2 associate degrees  Diabetic: No Nutrition Risk Assessment:  Has the patient had any N/V/D within the last 2 months?  No  Does the patient have any non-healing wounds?  No  Has the patient had any unintentional weight loss or weight gain?  No   Diabetes:  Is the patient diabetic?  No  If diabetic, was a CBG obtained today?  N/A Did the patient bring in their  glucometer from home?  N/A How often do you monitor your CBG's? N/A.   Financial Strains and Diabetes Management:  Are you having any financial strains with the device, your supplies or your medication? N/A.  Does the patient want to be seen by Chronic Care Management for management of their diabetes?  N/A Would the patient like to be referred to a Nutritionist or for Diabetic Management?  N/A    Interpreter Needed?: No  Information entered by :: CJohnson, LPN   Activities of Daily Living In your present state of health, do you have any  difficulty performing the following activities: 08/28/2020  Hearing? N  Vision? N  Difficulty concentrating or making decisions? N  Walking or climbing stairs? N  Dressing or bathing? N  Doing errands, shopping? N  Preparing Food and eating ? N  Using the Toilet? N  In the past six months, have you accidently leaked urine? N  Do you have problems with loss of bowel control? N  Managing your Medications? N  Managing your Finances? N  Housekeeping or managing your Housekeeping? N  Some recent data might be hidden    Patient Care Team: Joaquim Nam, MD as PCP - General (Family Medicine) [name], MD as PCP - Cardiology (Cardiology)  Indicate any recent Medical Services you may have received from other than [facility] providers in the past year (date may be approximate).     Assessment:   This is a routine wellness examination for Randy Pittman.  Hearing/Vision screen  Hearing Screening   125Hz  250Hz  500Hz  1000Hz  2000Hz  3000Hz  4000Hz  6000Hz  8000Hz   Right ear:           Left ear:           Vision Screening Comments: Patient gets annual eye exams   Dietary issues and exercise activities discussed: Current Exercise Habits: Home exercise routine, Type of exercise: walking, Time (Minutes): 60, Frequency (Times/Week): 7, Weekly Exercise (Minutes/Week): 420, Intensity: Moderate, Exercise limited by: None identified  Goals    . Patient Stated      08/28/2020, I will continue to walk about 10,000 steps a day.       Depression Screen PHQ 2/9 Scores 08/28/2020 08/23/2019 08/20/2018  PHQ - 2 Score 0 0 0  PHQ- 9 Score 0 - -    Fall Risk Fall Risk  08/28/2020 08/23/2019 08/20/2018  Falls in the past year? 0 0 No  Number falls in past yr: 0 0 -  Injury with Fall? 0 0 -  Risk for fall due to : Medication side effect - -  Follow up Falls evaluation completed;Falls prevention discussed - -    Any stairs in or around the home? Yes  If so, are there any without handrails? No  Home free of loose throw rugs in walkways, pet beds, electrical cords, etc? Yes  Adequate lighting in your home to reduce risk of falls? Yes   ASSISTIVE DEVICES UTILIZED TO PREVENT FALLS:  Life alert? No  Use of a cane, walker or w/c? No  Grab bars in the bathroom? No  Shower chair or bench in shower? No  Elevated toilet seat or a handicapped toilet? No   TIMED UP AND GO:  Was the test performed? N/A, telephonic visit.    Cognitive Function: MMSE - Mini Mental State Exam 08/28/2020  Orientation to time 5  Orientation to Place 5  Registration 3  Attention/ Calculation 5  Recall 3  Language- repeat 1       Mini Cog  Mini-Cog screen was completed. Maximum score is 22. A value of 0 denotes this part of the MMSE was not completed or the patient failed this part of the Mini-Cog screening.  Immunizations Immunization History  Administered Date(s) Administered  . Influenza Whole 11/04/2004  . Influenza, Seasonal, Injecte, Preservative Fre 10/05/2016  . Influenza-Unspecified 09/24/2017, 09/26/2019  . PFIZER SARS-COV-2 Vaccination 01/24/2020, 02/14/2020  . Pneumococcal Polysaccharide-23 08/20/2018  . Td 02/19/2001, 07/11/2019  . Tdap 11/29/2010  . Zoster 10/07/2013  . Zoster Recombinat (Shingrix) 09/21/2018, 02/03/2019    TDAP status: Up to  date Flu Vaccine status: due, will get at upcoming physical Pneumococcal vaccine status: due, will get at  upcoming physical  Covid-19 vaccine status: Completed vaccines  Qualifies for Shingles Vaccine? Yes   Zostavax completed Yes   Shingrix Completed?: Yes  Screening Tests Health Maintenance  Topic Date Due  . COLONOSCOPY  07/18/2019  . PNA vac Low Risk Adult (2 of 2 - PCV13) 08/21/2019  . INFLUENZA VACCINE  07/22/2020  . TETANUS/TDAP  07/10/2029  . COVID-19 Vaccine  Completed  . Hepatitis C Screening  Completed    Health Maintenance  Health Maintenance Due  Topic Date Due  . COLONOSCOPY  07/18/2019  . PNA vac Low Risk Adult (2 of 2 - PCV13) 08/21/2019  . INFLUENZA VACCINE  07/22/2020    Colorectal cancer screening: due, wants to discuss with provider at physical   Lung Cancer Screening: (Low Dose CT Chest recommended if Age 63-80 years, 30 pack-year currently smoking OR have quit w/in 15 years.) does not qualify.    Additional Screening:  Hepatitis C Screening: does qualify; Completed 05/15/2015  Vision Screening: Recommended annual ophthalmology exams for early detection of glaucoma and other disorders of the eye. Is the patient up to date with their annual eye exam?  Yes  Who is the provider or what is the name of the office in which the patient attends annual eye exams? Dr. Nelle Don If pt is not established with a provider, would they like to be referred to a provider to establish care? No .   Dental Screening: Recommended annual dental exams for proper oral hygiene  Community Resource Referral / Chronic Care Management: CRR required this visit?  No   CCM required this visit?  No      Plan:     I have personally reviewed and noted the following in the patient's chart:   . Medical and social history . Use of alcohol, tobacco or illicit drugs  . Current medications and supplements . Functional ability and status . Nutritional status . Physical activity . Advanced directives . List of other physicians . Hospitalizations, surgeries, and ER visits in  previous 12 months . Vitals . Screenings to include cognitive, depression, and falls . Referrals and appointments  In addition, I have reviewed and discussed with patient certain preventive protocols, quality metrics, and best practice recommendations. A written personalized care plan for preventive services as well as general preventive health recommendations were provided to patient.   Due to this being a telephonic visit, the after visit summary with patients personalized plan was offered to patient via mail or my-chart. Patient preferred to pick up at office at next visit.   Janalyn Shy, LPN   03/23/7407

## 2020-08-28 NOTE — Patient Instructions (Signed)
[name] , Thank you for taking time to come for your Medicare Wellness Visit. I appreciate your ongoing commitment to your health goals. Please review the following plan we discussed and let me know if I can assist you in the future.   Screening recommendations/referrals: Colonoscopy: due, will discuss with provider at physical Recommended yearly ophthalmology/optometry visit for glaucoma screening and checkup Recommended yearly dental visit for hygiene and checkup  Vaccinations: Influenza vaccine: due, will get this at upcoming physical Pneumococcal vaccine: due, will get this at upcoming physical Tdap vaccine: Up to date, completed 07/11/2019, due 06/2029 Shingles vaccine: Completed series   Covid-19: Completed series  Advanced directives: Please bring a copy of your POA (Power of [place]) and/or Living Will to your next appointment.   Conditions/risks identified: hyperlipidemia  Next appointment: Follow up in one year for your annual wellness visit.   Preventive Care 67 Years and Older, Male Preventive care refers to lifestyle choices and visits with your health care provider that can promote health and wellness. What does preventive care include?  A yearly physical exam. This is also called an annual well check.  Dental exams once or twice a year.  Routine eye exams. Ask your health care provider how often you should have your eyes checked.  Personal lifestyle choices, including:  Daily care of your teeth and gums.  Regular physical activity.  Eating a healthy diet.  Avoiding tobacco and drug use.  Limiting alcohol use.  Practicing safe sex.  Taking low doses of aspirin every day.  Taking vitamin and mineral supplements as recommended by your health care provider. What happens during an annual well check? The services and screenings done by your health care provider during your annual well check will depend on your age, overall health, lifestyle risk factors,  and family history of disease. Counseling  Your health care provider may ask you questions about your:  Alcohol use.  Tobacco use.  Drug use.  Emotional well-being.  Home and relationship well-being.  Sexual activity.  Eating habits.  History of falls.  Memory and ability to understand (cognition).  Work and work Astronomer. Screening  You may have the following tests or measurements:  Height, weight, and BMI.  Blood pressure.  Lipid and cholesterol levels. These may be checked every 5 years, or more frequently if you are over 1 years old.  Skin check.  Lung cancer screening. You may have this screening every year starting at age 48 if you have a 30-pack-year history of smoking and currently smoke or have quit within the past 15 years.  Fecal occult blood test (FOBT) of the stool. You may have this test every year starting at age 33.  Flexible sigmoidoscopy or colonoscopy. You may have a sigmoidoscopy every 5 years or a colonoscopy every 10 years starting at age 4.  Prostate cancer screening. Recommendations will vary depending on your family history and other risks.  Hepatitis C blood test.  Hepatitis B blood test.  Sexually transmitted disease (STD) testing.  Diabetes screening. This is done by checking your blood sugar (glucose) after you have not eaten for a while (fasting). You may have this done every 1-3 years.  Abdominal aortic aneurysm (AAA) screening. You may need this if you are a current or former smoker.  Osteoporosis. You may be screened starting at age 93 if you are at high risk. Talk with your health care provider about your test results, treatment options, and if necessary, the need for more tests. Vaccines  Your health care provider may recommend certain vaccines, such as:  Influenza vaccine. This is recommended every year.  Tetanus, diphtheria, and acellular pertussis (Tdap, Td) vaccine. You may need a Td booster every 10  years.  Zoster vaccine. You may need this after age 65.  Pneumococcal 13-valent conjugate (PCV13) vaccine. One dose is recommended after age 14.  Pneumococcal polysaccharide (PPSV23) vaccine. One dose is recommended after age 45. Talk to your health care provider about which screenings and vaccines you need and how often you need them. This information is not intended to replace advice given to you by your health care provider. Make sure you discuss any questions you have with your health care provider. Document Released: 01/04/2016 Document Revised: 08/27/2016 Document Reviewed: 10/09/2015 Elsevier Interactive Patient Education  2017 [place] Prevention in the Home Falls can cause injuries. They can happen to people of all ages. There are many things you can do to make your home safe and to help prevent falls. What can I do on the outside of my home?  Regularly fix the edges of walkways and driveways and fix any cracks.  Remove anything that might make you trip as you walk through a door, such as a raised step or threshold.  Trim any bushes or trees on the path to your home.  Use bright outdoor lighting.  Clear any walking paths of anything that might make someone trip, such as rocks or tools.  Regularly check to see if handrails are loose or broken. Make sure that both sides of any steps have handrails.  Any raised decks and porches should have guardrails on the edges.  Have any leaves, snow, or ice cleared regularly.  Use sand or salt on walking paths during winter.  Clean up any spills in your garage right away. This includes oil or grease spills. What can I do in the bathroom?  Use night lights.  Install grab bars by the toilet and in the tub and shower. Do not use towel bars as grab bars.  Use non-skid mats or decals in the tub or shower.  If you need to sit down in the shower, use a plastic, non-slip stool.  Keep the floor dry. Clean up any water that  spills on the floor as soon as it happens.  Remove soap buildup in the tub or shower regularly.  Attach bath mats securely with double-sided non-slip rug tape.  Do not have throw rugs and other things on the floor that can make you trip. What can I do in the bedroom?  Use night lights.  Make sure that you have a light by your bed that is easy to reach.  Do not use any sheets or blankets that are too big for your bed. They should not hang down onto the floor.  Have a firm chair that has side arms. You can use this for support while you get dressed.  Do not have throw rugs and other things on the floor that can make you trip. What can I do in the kitchen?  Clean up any spills right away.  Avoid walking on wet floors.  Keep items that you use a lot in easy-to-reach places.  If you need to reach something above you, use a strong step stool that has a grab bar.  Keep electrical cords out of the way.  Do not use floor polish or wax that makes floors slippery. If you must use wax, use non-skid floor wax.  Do  not have throw rugs and other things on the floor that can make you trip. What can I do with my stairs?  Do not leave any items on the stairs.  Make sure that there are handrails on both sides of the stairs and use them. Fix handrails that are broken or loose. Make sure that handrails are as long as the stairways.  Check any carpeting to make sure that it is firmly attached to the stairs. Fix any carpet that is loose or worn.  Avoid having throw rugs at the top or bottom of the stairs. If you do have throw rugs, attach them to the floor with carpet tape.  Make sure that you have a light switch at the top of the stairs and the bottom of the stairs. If you do not have them, ask someone to add them for you. What else can I do to help prevent falls?  Wear shoes that:  Do not have high heels.  Have rubber bottoms.  Are comfortable and fit you well.  Are closed at the  toe. Do not wear sandals.  If you use a stepladder:  Make sure that it is fully opened. Do not climb a closed stepladder.  Make sure that both sides of the stepladder are locked into place.  Ask someone to hold it for you, if possible.  Clearly mark and make sure that you can see:  Any grab bars or handrails.  First and last steps.  Where the edge of each step is.  Use tools that help you move around (mobility aids) if they are needed. These include:  Canes.  Walkers.  Scooters.  Crutches.  Turn on the lights when you go into a dark area. Replace any light bulbs as soon as they burn out.  Set up your furniture so you have a clear path. Avoid moving your furniture around.  If any of your floors are uneven, fix them.  If there are any pets around you, be aware of where they are.  Review your medicines with your doctor. Some medicines can make you feel dizzy. This can increase your chance of falling. Ask your doctor what other things that you can do to help prevent falls. This information is not intended to replace advice given to you by your health care provider. Make sure you discuss any questions you have with your health care provider. Document Released: 10/04/2009 Document Revised: 05/15/2016 Document Reviewed: 01/12/2015 Elsevier Interactive Patient Education  2017 [employer].

## 2020-08-28 NOTE — Progress Notes (Signed)
PCP notes:  Health Maintenance: Prevnar 13- due Flu- due  Colonoscopy- due   Abnormal Screenings: none   Patient concerns: Dizziness (only 1 episode a few days ago)   Nurse concerns: none   Next PCP appt.: 08/30/2020 @ 11 am

## 2020-08-30 ENCOUNTER — Encounter: Payer: Self-pay | Admitting: Family Medicine

## 2020-08-30 ENCOUNTER — Ambulatory Visit (INDEPENDENT_AMBULATORY_CARE_PROVIDER_SITE_OTHER): Payer: Medicare Other | Admitting: Family Medicine

## 2020-08-30 ENCOUNTER — Other Ambulatory Visit: Payer: Self-pay

## 2020-08-30 VITALS — BP 122/82 | HR 84 | Temp 97.2°F | Ht 72.0 in | Wt 216.0 lb

## 2020-08-30 DIAGNOSIS — I712 Thoracic aortic aneurysm, without rupture: Secondary | ICD-10-CM | POA: Diagnosis not present

## 2020-08-30 DIAGNOSIS — I2510 Atherosclerotic heart disease of native coronary artery without angina pectoris: Secondary | ICD-10-CM

## 2020-08-30 DIAGNOSIS — I7121 Aneurysm of the ascending aorta, without rupture: Secondary | ICD-10-CM

## 2020-08-30 DIAGNOSIS — Z7189 Other specified counseling: Secondary | ICD-10-CM

## 2020-08-30 DIAGNOSIS — Z23 Encounter for immunization: Secondary | ICD-10-CM

## 2020-08-30 DIAGNOSIS — Z0000 Encounter for general adult medical examination without abnormal findings: Secondary | ICD-10-CM

## 2020-08-30 MED ORDER — NITROGLYCERIN 0.4 MG SL SUBL: 0.4000 mg | SUBLINGUAL_TABLET | SUBLINGUAL | 3 refills | Status: DC | PRN

## 2020-08-30 MED ORDER — METOPROLOL SUCCINATE ER 25 MG PO TB24
12.5000 mg | ORAL_TABLET | Freq: Every evening | ORAL | Status: DC
Start: 2020-08-30 — End: 2021-02-07

## 2020-08-30 NOTE — Patient Instructions (Addendum)
Given the size of your aorta, it is reasonable to recheck CT in 1 year.  It was expected to see calcifications in your arteries, given your history.  The treatment is to continue your baseline meds.  If you have chest pain or shortness of breath, then seek evaluation.    I will update cardiology to check on options to get your cholesterol a little lower.   Please call GI about follow up.    Flu shot today.   Try cutting metoprolol in half.  Update me if your BP is persistently above 140/90 or if you have other troubles.   Take care.  Glad to see you.

## 2020-08-30 NOTE — Progress Notes (Signed)
This visit occurred during the SARS-CoV-2 public health emergency.  Safety protocols were in place, including screening questions prior to the visit, additional usage of staff PPE, and extensive cleaning of exam room while observing appropriate contact time as indicated for disinfecting solutions.  Dizzy sensation. Noted after standing.  Better with drinking gatorade.  No syncope.  No room spinning.  He felt lightheaded.  He didn't have palpitations.  This was early AM, right after getting up from sleeping.  Resolved with sitting down and relaxing.  No CP with the event.  No neuro sx.  This was a few weeks ago.  No sx now.    Flu 2021 Shingles up-to-date PNA discussed with patient.  Already had pneumonia 23. Tetanus 2020 covid vaccine 2021 Colon cancer screening discussed with patient.  he can call about f/u 2021.  Letter given to patient.   PSA wnl 2021 Advance directive-wife designated if patient were incapacitated.  CAD.  No CP, SOB, BLE edema.   Using medications without problems: yes, still on metoprolol and ASA and lipitor.   Muscle aches:no Diet compliance: yes Exercise: walking at work, up to 4-5 miles.   No NTG use.    CT with coronary calcifications.   Stable ascending thoracic aortic aneurysm measuring approximately 4.2 cm. Recommend annual imaging followup by CTA or MRA. This recommendation follows 2010 ACCF/AHA/AATS/ACR/ASA/SCA/SCAI/SIR/STS/SVM Guidelines for the Diagnosis and Management of Patients with Thoracic Aortic Disease. Circulation. 2010; 121: D638-V564. Aortic aneurysm NOS (ICD10-I71.9)  He is still working 12 hour shifts overnight at baseline.  No new memory changes.  Recently brief memory testing wnl.    PMH and SH reviewed  Meds, vitals, and allergies reviewed.   ROS: Per HPI unless specifically indicated in ROS section   GEN: nad, alert and oriented HEENT: ncat NECK: supple w/o LA CV: rrr. PULM: ctab, no inc wob ABD: soft, +bs EXT: no edema SKIN:  no acute rash  At least 30 minutes were devoted to patient care in this encounter (this can potentially include time spent reviewing the patient's file/history, interviewing and examining the patient, counseling/reviewing plan with patient, ordering referrals, ordering tests, reviewing relevant laboratory or x-ray data, and documenting the encounter).

## 2020-09-02 ENCOUNTER — Telehealth: Payer: Self-pay | Admitting: Family Medicine

## 2020-09-02 DIAGNOSIS — I2510 Atherosclerotic heart disease of native coronary artery without angina pectoris: Secondary | ICD-10-CM

## 2020-09-02 DIAGNOSIS — Z0000 Encounter for general adult medical examination without abnormal findings: Secondary | ICD-10-CM | POA: Insufficient documentation

## 2020-09-02 NOTE — Assessment & Plan Note (Signed)
Flu 2021 Shingles up-to-date PNA discussed with patient.  Already had pneumonia 23. Tetanus 2020 covid vaccine 2021 Colon cancer screening discussed with patient.  he can call about f/u 2021.  Letter given to patient.   PSA wnl 2021 Advance directive-wife designated if patient were incapacitated.

## 2020-09-02 NOTE — Assessment & Plan Note (Signed)
No change in medications at this point.  I will update cardiology and ask about options regarding his lipids, to get his LDL slightly lower.  Zetia may be a reasonable option.  Continue Lipitor and aspirin for now.  Discussed cutting his metoprolol in half, down to 12.5 mg a day, since he had been occasionally lightheaded.  He will update me as needed.  See after visit summary.

## 2020-09-02 NOTE — Assessment & Plan Note (Signed)
Advance directive- wife designated if patient were incapacitated.  

## 2020-09-02 NOTE — Telephone Encounter (Signed)
His LDL is still slightly above goal.  Do you have a preference other than adding Zetia at this point?  He is still tolerating Lipitor 80 mg a day.  If you are okay with the Zetia then I can add that on and make follow-up lab arrangements.  Please let me know your preference.  Thanks.

## 2020-09-02 NOTE — Assessment & Plan Note (Signed)
Reasonable to recheck imaging later on.  Discussed with patient.  He agrees.

## 2020-09-03 NOTE — Telephone Encounter (Signed)
Hi, Yes I agree adding Zetia is a great idea. Randy Pittman

## 2020-09-04 MED ORDER — EZETIMIBE 10 MG PO TABS: 10.0000 mg | ORAL_TABLET | Freq: Every day | ORAL | 3 refills | Status: DC

## 2020-09-04 NOTE — Addendum Note (Signed)
Addended by: Joaquim Nam on: 09/04/2020 03:52 PM   Modules accepted: Orders

## 2020-09-04 NOTE — Telephone Encounter (Signed)
Patient advised.  Lab appt scheduled.  

## 2020-09-04 NOTE — Telephone Encounter (Signed)
Please update patient the cardiology agreed with adding on Zetia. I sent the new prescription. Take 1 pill a day. Continue his other medications. I would like to recheck his labs in 2 months. He needs a fasting lab appointment. I put in the follow-up orders. Thanks.

## 2020-09-06 DIAGNOSIS — H35033 Hypertensive retinopathy, bilateral: Secondary | ICD-10-CM | POA: Diagnosis not present

## 2020-09-06 DIAGNOSIS — H43813 Vitreous degeneration, bilateral: Secondary | ICD-10-CM | POA: Diagnosis not present

## 2020-09-06 DIAGNOSIS — H35013 Changes in retinal vascular appearance, bilateral: Secondary | ICD-10-CM | POA: Diagnosis not present

## 2020-09-06 DIAGNOSIS — H2513 Age-related nuclear cataract, bilateral: Secondary | ICD-10-CM | POA: Diagnosis not present

## 2020-11-08 ENCOUNTER — Other Ambulatory Visit: Payer: Medicare Other

## 2020-11-09 ENCOUNTER — Other Ambulatory Visit (INDEPENDENT_AMBULATORY_CARE_PROVIDER_SITE_OTHER): Payer: Medicare Other

## 2020-11-09 ENCOUNTER — Other Ambulatory Visit: Payer: Self-pay

## 2020-11-09 DIAGNOSIS — I7121 Aneurysm of the ascending aorta, without rupture: Secondary | ICD-10-CM

## 2020-11-09 DIAGNOSIS — I712 Thoracic aortic aneurysm, without rupture, unspecified: Secondary | ICD-10-CM

## 2020-11-09 DIAGNOSIS — I2510 Atherosclerotic heart disease of native coronary artery without angina pectoris: Secondary | ICD-10-CM

## 2020-11-09 DIAGNOSIS — Z0189 Encounter for other specified special examinations: Secondary | ICD-10-CM

## 2020-11-09 LAB — LIPID PANEL
Cholesterol: 108 mg/dL (ref 0–200)
HDL: 60.6 mg/dL (ref >39.00)
LDL Cholesterol: 40 mg/dL (ref 0–99)
NonHDL: 47.89
Total CHOL/HDL Ratio: 2
Triglycerides: 37 mg/dL (ref 0.0–149.0)
VLDL: 7.4 mg/dL (ref 0.0–40.0)

## 2020-11-09 LAB — HEPATIC FUNCTION PANEL
ALT: 14 U/L (ref 0–53)
AST: 15 U/L (ref 0–37)
Albumin: 4.2 g/dL (ref 3.5–5.2)
Alkaline Phosphatase: 38 U/L — ABNORMAL LOW (ref 39–117)
Bilirubin, Direct: 0.2 mg/dL (ref 0.0–0.3)
Total Bilirubin: 0.7 mg/dL (ref 0.2–1.2)
Total Protein: 6.6 g/dL (ref 6.0–8.3)

## 2021-02-07 ENCOUNTER — Other Ambulatory Visit: Payer: Self-pay

## 2021-02-07 ENCOUNTER — Ambulatory Visit (INDEPENDENT_AMBULATORY_CARE_PROVIDER_SITE_OTHER): Payer: Medicare Other | Admitting: Cardiology

## 2021-02-07 ENCOUNTER — Encounter: Payer: Self-pay | Admitting: Cardiology

## 2021-02-07 VITALS — BP 146/90 | HR 82 | Ht 72.0 in | Wt 216.0 lb

## 2021-02-07 DIAGNOSIS — I493 Ventricular premature depolarization: Secondary | ICD-10-CM

## 2021-02-07 DIAGNOSIS — I2510 Atherosclerotic heart disease of native coronary artery without angina pectoris: Secondary | ICD-10-CM | POA: Diagnosis not present

## 2021-02-07 DIAGNOSIS — I7121 Aneurysm of the ascending aorta, without rupture: Secondary | ICD-10-CM

## 2021-02-07 DIAGNOSIS — I712 Thoracic aortic aneurysm, without rupture: Secondary | ICD-10-CM | POA: Diagnosis not present

## 2021-02-07 DIAGNOSIS — I10 Essential (primary) hypertension: Secondary | ICD-10-CM | POA: Diagnosis not present

## 2021-02-07 DIAGNOSIS — E782 Mixed hyperlipidemia: Secondary | ICD-10-CM | POA: Diagnosis not present

## 2021-02-07 MED ORDER — CARVEDILOL 6.25 MG PO TABS: 6.2500 mg | ORAL_TABLET | Freq: Two times a day (BID) | ORAL | 3 refills | Status: DC

## 2021-02-07 NOTE — Progress Notes (Signed)
Cardiology Office Note    Date:  02/07/2021   ID:  Randy Pittman, DOB Dec 06, 1951, MRN [id number]  PCP:  Joaquim Nam, MD  Cardiologist: Dr. Delton See  Reason for visit: 1 year follow up  History of Present Illness:  Randy Pittman is a 70 y.o. male with history of CAD after undergoing lifestyle screening and noted to have PVCs.  He was asymptomatic.  His twin brother has significant ischemic cardiomyopathy.  Abnormal nuclear stress test 12/27/15 showed moderate appearing inferior wall ischemia from the apex to the base and diffuse hypokinesis is worse in the apex EF 43%.  Left heart cath showed significant 2 vessel CAD with subtotal 99% OM1 and 65% mid RCA and total occlusion of the PLA with collaterals he had DES to the circumflex marginal and RCA may need stenting in the future if it progresses.  He had mild LV dysfunction ejection fraction 50%.  Also has hypertension and HLD.    The patient is coming for regular annual follow-up, he has been compliant with his medications and is tolerating them well, denies any myalgias.  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  No lower extremity edema.  He has noticed that lately his systolic blood pressure has been in 130s but diastolic in 80s and 90s.  Past Medical History:  Diagnosis Date  . CAD (coronary artery disease)   . Hyperlipidemia 07/1995  . Low testosterone    per Dr. Reuel Boom, with urology    Past Surgical History:  Procedure Laterality Date  . CARDIAC CATHETERIZATION N/A 01/01/2017   Procedure: Left Heart Cath and Coronary Angiography;  Surgeon: Lennette Bihari, MD;  Location: [facility] INVASIVE CV LAB;  Service: Cardiovascular;  Laterality: N/A;  . CARDIAC CATHETERIZATION N/A 01/01/2017   Procedure: Coronary Stent Intervention;  Surgeon: Lennette Bihari, MD;  Location: [facility] INVASIVE CV LAB;  Service: Cardiovascular;  Laterality: N/A;   . COLONOSCOPY    . CORONARY ANGIOPLASTY WITH STENT PLACEMENT  01/01/2017    Current Medications: Current Meds  Medication Sig  . aspirin 81 MG tablet Take 81 mg by mouth daily.  Marland Kitchen atorvastatin (LIPITOR) 80 MG tablet Take 80 mg by mouth daily.  . carvedilol (COREG) 6.25 MG tablet Take 1 tablet (6.25 mg total) by mouth 2 (two) times daily.  . cholecalciferol (VITAMIN D) 1000 UNITS tablet Take 1,000 Units by mouth daily.  Marland Kitchen ezetimibe (ZETIA) 10 MG tablet Take 1 tablet (10 mg total) by mouth daily.  . nitroGLYCERIN (NITROSTAT) 0.4 MG SL tablet Place 1 tablet (0.4 mg total) under the tongue every 5 (five) minutes as needed for chest pain.  . vitamin B-12 (CYANOCOBALAMIN) 500 MCG tablet Take 500 mcg by mouth daily.  . [DISCONTINUED] metoprolol succinate (TOPROL-XL) 25 MG 24 hr tablet Take 0.5 tablets (12.5 mg total) by mouth every evening.     Allergies:   Patient has no known allergies.   Social History   Socioeconomic History  . Marital status: Married    Spouse name: Not on file  . Number of children: Not on file  . Years of education: Not on file  . Highest education level: Not on file  Occupational History  . Occupation: Scientific laboratory technician [employer]  Tobacco Use  . Smoking status: Former Smoker    Packs/day: 2.00    Years: 5.00    Pack years: 10.00  Types: Cigarettes    Quit date: 1980    Years since quitting: 42.1  . Smokeless tobacco: Former Neurosurgeon    Types: Chew    Quit date: Photographer  . Vaping Use: Never used  Substance and Sexual Activity  . Alcohol use: Yes    Comment: 1 a day or less.    . Drug use: No  . Sexual activity: Not on file  Other Topics Concern  . 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 Difficulty of Paying Living Expenses: Not hard at all   Food Insecurity: No Food Insecurity  . Worried About Programme researcher, broadcasting/film/video in the Last Year: Never true  . Ran Out of Food in the Last Year: Never true  Transportation Needs: No Transportation Needs  . Lack of Transportation (Medical): No  . Lack of Transportation (Non-Medical): No  Physical Activity: Sufficiently Active  . Days of Exercise per Week: 7 days  . Minutes of Exercise per Session: 60 min  Stress: No Stress Concern Present  . Feeling of Stress : Not at all  Social Connections: Not on file     Family History:  The patient's   family history includes Cirrhosis in his mother; Colon cancer in an other family member; Colon polyps in his brother; Diabetes in his mother; Heart disease in his brother; Hypertension in his mother; Parkinsonism in his father; Prostate cancer in an other family member.   ROS:   Please see the history of present illness.    Review of Systems  Constitutional: Negative.  HENT: Negative.   Cardiovascular: Negative.   Respiratory: Negative.   Endocrine: Negative.   Hematologic/Lymphatic: Negative.   Musculoskeletal: Negative.   Gastrointestinal: Negative.   Genitourinary: Negative.   Neurological: Negative.    All other systems reviewed and are negative.  PHYSICAL EXAM:   VS:  BP (!) 146/90   Pulse 82   Ht 6' (1.829 m)   Wt 216 lb (98 kg)   SpO2 97%   BMI 29.29 kg/m   Physical Exam  GEN: Well nourished, well developed, in no acute distress  Neck: no JVD, carotid bruits, or masses Cardiac:RRR; no murmurs, rubs, or gallops  Respiratory:  clear to auscultation bilaterally, normal work of breathing GI: soft, nontender, nondistended, + BS Ext: without cyanosis, clubbing, or edema, Good distal pulses bilaterally Neuro:  Alert and Oriented x 3 Psych: euthymic mood, full affect  Wt Readings from Last 3 Encounters:  02/07/21 216 lb (98 kg)  08/30/20 216 lb (98 kg)  02/15/20 216 lb (98 kg)    Studies/Labs Reviewed:   EKG:  EKG is ordered  today.  It shows normal sinus rhythm with left axis deviation, inferior infarct age undetermined, this is unchanged from prior and personally reviewed.  Recent Labs: 08/22/2020: BUN 21; Creatinine, Ser 1.04; Hemoglobin 14.2; Platelets 186.0; Potassium 4.1; Sodium 140 11/09/2020: ALT 14   Lipid Panel    Component Value Date/Time   CHOL 108 11/09/2020 0754   CHOL 130 11/09/2017 0939   TRIG 37.0 11/09/2020 0754   HDL 60.60 11/09/2020 0754   HDL 61 11/09/2017 0939   CHOLHDL 2 11/09/2020 0754   VLDL 7.4 11/09/2020 0754   LDLCALC 40 11/09/2020 0754  LDLCALC 58 11/09/2017 0939   LDLDIRECT 169.4 07/09/2012 1021   Additional studies/ records that were reviewed today include:   Cardiac catheterization 01/01/17 Procedures    Coronary Stent Intervention  Left Heart Cath and Coronary Angiography  Conclusion       There is mild left ventricular systolic dysfunction.  Dist RCA lesion, 100 %stenosed.  Mid RCA lesion, 65 %stenosed.  A STENT SYNERGY DES [id number] drug eluting stent was successfully placed.  Lat 1st Mrg lesion, 99 %stenosed.  Post intervention, there is a 0% residual stenosis.   Low-normal to mild LV dysfunction with an ejection fraction of 50% with a small focal region of distal inferior hypocontractility.   Significant 2 vessel coronary obstructive disease with a subtotal 99 100% stenosis involving the inferior branch of the obtuse marginal 1 vessel; and 65% mid RCA stenosis proximal to the acute margin with total occlusion of the PLA with retrograde collateralization to the distal PLA via the AV groove circumflex branches.  LAD with mild luminal irregularity.   Successful PCI to the circumflex marginal vessel with ultimate insertion of a Synergy DES 2.528 mm stent postdilated 2.75 mm with the 99% stenosis reduced to 0% and resumption of brisk TIMI-3 flow    2-D echo 12/26/16   Study Conclusions   - Left ventricle: The cavity size was normal. Wall thickness was    increased in a pattern of mild LVH. Systolic function was mildly   to moderately reduced. The estimated ejection fraction was in the   range of 40% to 45%. Moderate hypokinesis of the   mid-apicalinferoseptal myocardium. Doppler parameters are   consistent with abnormal left ventricular relaxation (grade 1   diastolic dysfunction). - Aortic valve: There was trivial regurgitation. - Right atrium: The atrium was mildly dilated.   TTE: 05/01/2017 - Left ventricle: The cavity size was normal. Wall thickness was   increased in a pattern of mild LVH. There was moderate concentric   hypertrophy. Systolic function was normal. The estimated ejection   fraction was in the range of 50% to 55%. Diffuse hypokinesis.   Doppler parameters are consistent with abnormal left ventricular   relaxation (grade 1 diastolic dysfunction). - Aortic valve: There was mild regurgitation. - Aorta: Ascending aortic diameter: 42 mm (S). - Ascending aorta: The ascending aorta was mildly dilated. - Mitral valve: Calcified annulus. Mildly thickened, mildly   calcified leaflets . - Left atrium: The atrium was mildly dilated.   Nuclear stress test 12/26/16 Study Highlights     Nuclear stress EF: 43%.  Findings consistent with ischemia.  This is an intermediate risk study.  The left ventricular ejection fraction is moderately decreased (30-44%).  Upsloping ST segment depression ST segment depression of 1 mm was noted during stress in the I, aVL, V5 and V6 leads.   Moderate appearing inferior wall ischemia from apex to base Diffuse hypokinesis worse in apex EF 43%      EKG performed today June 25, 2018 shows normal sinus rhythm left axis deviation, otherwise normal EKG unchanged from prior.  This was personally reviewed.   ASSESSMENT:    1. Coronary artery disease involving native coronary artery of native heart without angina pectoris   2. PVC's (premature ventricular contractions)   3. Ascending aortic  aneurysm (HCC)   4. Primary hypertension   5. Mixed hyperlipidemia    PLAN:  In order of problems listed above:  CAD status post DES to the circumflex marginal with residual RCA disease 12/2016.  The patient  is asymptomatic, LVEF improved from 40% to 55 to 60%.  EKG is unchanged and shows normal sinus rhythm with PACs right bundle branch block and no ischemic changes.  Since his blood pressure is elevated, I will switch Toprol-XL 12.5 mg daily to carvedilol 6.25 mg p.o. twice daily.  Ischemic cardiomyopathy ejection fraction improved t from 40% to 55 to 60% on the echocardiogram in June 2019.  We will switch metoprolol to carvedilol.  Hyperlipidemia on Lipitor -he is tolerating it well, His lipids were at goal in November 2021, will continue the same regimen.  Normal LFTs.  Ascending aortic aneurysm, with maximal diameter 44 mm on the echo in 2019, chest CTA in June 2020 showed stable size of the ascending aorta measuring 42 mm, this was stable in July 2021, this might be upper normal limit for a gentleman of his age in height, however will need to continue annual follow-up, neck CTA scheduled for 06/19/2021.  Follow-up in 1 year with Dr. Shari Prows.  Medication Adjustments/Labs and Tests Ordered: Current medicines are reviewed at length with the patient today.  Concerns regarding medicines are outlined above.  Medication changes, Labs and Tests ordered today are listed in the Patient Instructions below. Patient Instructions  Medication Instructions:  STOP METOPROLOL START CARVEDILOL 6.25 MG TWICE DAILY *If you need a refill on your cardiac medications before your next appointment, please call your pharmacy*   Lab Work: NONE If you have labs (blood work) drawn today and your tests are completely normal, you will receive your results only by: Marland Kitchen MyChart Message (if you have MyChart) OR . A paper copy in the mail If you have any lab test that is abnormal or we need to change your treatment,  we will call you to review the results.   Testing/Procedures: NONE   Follow-Up: At [facility], you and your health needs are our priority.  As part of our continuing mission to provide you with exceptional heart care, we have created designated Provider Care Teams.  These Care Teams include your primary Cardiologist (physician) and Advanced Practice Providers (APPs -  Physician Assistants and Nurse Practitioners) who all work together to provide you with the care you need, when you need it.  We recommend signing up for the patient portal called "MyChart".  Sign up information is provided on this After Visit Summary.  MyChart is used to connect with patients for Virtual Visits (Telemedicine).  Patients are able to view lab/test results, encounter notes, upcoming appointments, etc.  Non-urgent messages can be sent to your provider as well.   To learn more about what you can do with MyChart, go to [email or web address].    Your next appointment:   12 month(s)  The format for your next appointment:   In Person  Provider:   Laurance Flatten, MD   Other Instructions NONE     Signed, Tobias Alexander, MD  02/07/2021 12:49 PM    [facility] [address] [place], [place], [place]  [address] Phone: [phone]; Fax: [phone]

## 2021-02-07 NOTE — Patient Instructions (Addendum)
Medication Instructions:  STOP METOPROLOL START CARVEDILOL 6.25 MG TWICE DAILY *If you need a refill on your cardiac medications before your next appointment, please call your pharmacy*   Lab Work: NONE If you have labs (blood work) drawn today and your tests are completely normal, you will receive your results only by: Marland Kitchen MyChart Message (if you have MyChart) OR . A paper copy in the mail If you have any lab test that is abnormal or we need to change your treatment, we will call you to review the results.   Testing/Procedures: NONE   Follow-Up: At [facility], you and your health needs are our priority.  As part of our continuing mission to provide you with exceptional heart care, we have created designated Provider Care Teams.  These Care Teams include your primary Cardiologist (physician) and Advanced Practice Providers (APPs -  Physician Assistants and Nurse Practitioners) who all work together to provide you with the care you need, when you need it.  We recommend signing up for the patient portal called "MyChart".  Sign up information is provided on this After Visit Summary.  MyChart is used to connect with patients for Virtual Visits (Telemedicine).  Patients are able to view lab/test results, encounter notes, upcoming appointments, etc.  Non-urgent messages can be sent to your provider as well.   To learn more about what you can do with MyChart, go to [email or web address].    Your next appointment:   12 month(s)  The format for your next appointment:   In Person  Provider:   Laurance Flatten, MD   Other Instructions NONE

## 2021-02-28 ENCOUNTER — Other Ambulatory Visit: Payer: Self-pay | Admitting: Cardiology

## 2021-02-28 MED ORDER — METOPROLOL SUCCINATE ER 25 MG PO TB24: 12.5000 mg | ORAL_TABLET | Freq: Every day | ORAL | 1 refills | Status: DC

## 2021-02-28 NOTE — Telephone Encounter (Signed)
[name], MD  You 8 minutes ago (8:46 AM)     Yes, go back to metoprolol 12.5 mg po daily   Message text     You  [name], MD 17 hours ago (3:42 PM)     Dr. Delton See pt was on Toprol XL 12.5 mg po daily and this was switched to carvedilol at last OV with you a month ago. Do you want him to switch back to this regimen and stop coreg due to complaints mentioned?   Please advise!      Endorsed to the pt via mychart message that per Dr. Delton See, we can stop his carvedilol, and switch him back to Toprol XL 12.5 mg po daily.  Advised him to continue monitoring his symptoms.  Will send in script to his pharmacy on file.  Advised him to call the office with any further questions or concerns regarding this.

## 2021-03-22 ENCOUNTER — Encounter: Payer: Self-pay | Admitting: Family Medicine

## 2021-03-22 ENCOUNTER — Ambulatory Visit (INDEPENDENT_AMBULATORY_CARE_PROVIDER_SITE_OTHER): Payer: Medicare Other | Admitting: Family Medicine

## 2021-03-22 ENCOUNTER — Other Ambulatory Visit: Payer: Self-pay

## 2021-03-22 VITALS — BP 132/72 | HR 88 | Temp 98.1°F | Ht 72.0 in | Wt 208.0 lb

## 2021-03-22 DIAGNOSIS — R413 Other amnesia: Secondary | ICD-10-CM

## 2021-03-22 DIAGNOSIS — I2510 Atherosclerotic heart disease of native coronary artery without angina pectoris: Secondary | ICD-10-CM

## 2021-03-22 MED ORDER — METOPROLOL SUCCINATE ER 25 MG PO TB24: 25.0000 mg | ORAL_TABLET | Freq: Every day | ORAL | Status: DC

## 2021-03-22 NOTE — Patient Instructions (Addendum)
Calibrate your BP cuff.  Go to the lab on the way out.   If you have mychart we'll likely use that to update you.    Try increasing the metoprolol to 1 tab a day.  Take care.  Glad to see you.  We can see about potentially temporarily decreasing atorvastatin or getting other imaging later on.

## 2021-03-22 NOTE — Progress Notes (Addendum)
This visit occurred during the SARS-CoV-2 public health emergency.  Safety protocols were in place, including screening questions prior to the visit, additional usage of staff PPE, and extensive cleaning of exam room while observing appropriate contact time as indicated for disinfecting solutions.  Checked BP at home, with DBP is usually ~90.  130/96 this AM, after working night shift.  Had seen cardiology but hasn't changed to carvedilol yet.  He was concerned about the change and is still on metoprolol.  We talked about options.  He may not hear his wife all of the time.  Unclear how much that contributes to apparent memory changes.  Doing well at work.  It seems like wife notes a change when he has several days away from work.  We talked about statin and possible memory changes from that.    He is still walking several miles at work, each shift.  He is also walking a lot when off work.  No red flag events with memory.  Neither the patient nor I think he would be able to do his job if he had severe memory changes.  No chest pain shortness of breath or lower extremity edema.  Meds, vitals, and allergies reviewed.   ROS: Per HPI unless specifically indicated in ROS section   GEN: nad, alert and oriented HEENT: ncat NECK: supple w/o LA CV: rrr. PULM: ctab, no inc wob ABD: soft, +bs EXT: no edema SKIN: no acute rash CN 2-12 wnl B, S/S wnl x4  MMSE 30 out of 30, with all answers given rapidly and easily.

## 2021-03-23 LAB — COMPREHENSIVE METABOLIC PANEL
AG Ratio: 2.1 (calc) (ref 1.0–2.5)
ALT: 16 U/L (ref 9–46)
AST: 17 U/L (ref 10–35)
Albumin: 4.6 g/dL (ref 3.6–5.1)
Alkaline phosphatase (APISO): 44 U/L (ref 35–144)
BUN: 15 mg/dL (ref 7–25)
CO2: 26 mmol/L (ref 20–32)
Calcium: 9.7 mg/dL (ref 8.6–10.3)
Chloride: 105 mmol/L (ref 98–110)
Creat: 0.98 mg/dL (ref 0.70–1.25)
Globulin: 2.2 g/dL (calc) (ref 1.9–3.7)
Glucose, Bld: 92 mg/dL (ref 65–99)
Potassium: 4.4 mmol/L (ref 3.5–5.3)
Sodium: 143 mmol/L (ref 135–146)
Total Bilirubin: 0.8 mg/dL (ref 0.2–1.2)
Total Protein: 6.8 g/dL (ref 6.1–8.1)

## 2021-03-23 LAB — CBC WITH DIFFERENTIAL/PLATELET
Absolute Monocytes: 544 cells/uL (ref 200–950)
Basophils Absolute: 49 cells/uL (ref 0–200)
Basophils Relative: 1 %
Eosinophils Absolute: 250 cells/uL (ref 15–500)
Eosinophils Relative: 5.1 %
HCT: 44.9 % (ref 38.5–50.0)
Hemoglobin: 15.1 g/dL (ref 13.2–17.1)
Lymphs Abs: 1005 cells/uL (ref 850–3900)
MCH: 29.9 pg (ref 27.0–33.0)
MCHC: 33.6 g/dL (ref 32.0–36.0)
MCV: 88.9 fL (ref 80.0–100.0)
MPV: 11 fL (ref 7.5–12.5)
Monocytes Relative: 11.1 %
Neutro Abs: 3053 cells/uL (ref 1500–7800)
Neutrophils Relative %: 62.3 %
Platelets: 184 10*3/uL (ref 140–400)
RBC: 5.05 10*6/uL (ref 4.20–5.80)
RDW: 12.4 % (ref 11.0–15.0)
Total Lymphocyte: 20.5 %
WBC: 4.9 10*3/uL (ref 3.8–10.8)

## 2021-03-23 LAB — TSH: TSH: 1.51 mIU/L (ref 0.40–4.50)

## 2021-03-23 LAB — VITAMIN B12: Vitamin B-12: 772 pg/mL (ref 200–1100)

## 2021-03-24 NOTE — Assessment & Plan Note (Addendum)
With neurocognitive evaluation done approximately 9 years ago.  He is still working and not having mistakes at work.  There is no evidence of severe dysfunction.  We agreed to check his labs today.  He may need follow-up imaging later on.  We are going to increase his beta-blocker to address his blood pressure issue.  We only wanted to make 1 change at a time.  We may consider decreasing his statin in the future to see if he notices any change in his memory but we agreed not to do that yet.  We thought it was reasonable to defer further neurocognitive test at this point.  He agrees with plan.  45 minutes were devoted to patient care in this encounter (this includes time spent reviewing the patient's file/history, interviewing and examining the patient, counseling/reviewing plan with patient).

## 2021-04-22 ENCOUNTER — Ambulatory Visit: Payer: Medicare Other | Admitting: Cardiology

## 2021-06-12 ENCOUNTER — Other Ambulatory Visit: Payer: Self-pay | Admitting: *Deleted

## 2021-06-12 ENCOUNTER — Other Ambulatory Visit: Payer: Self-pay

## 2021-06-12 ENCOUNTER — Other Ambulatory Visit: Payer: Medicare Other | Admitting: *Deleted

## 2021-06-12 DIAGNOSIS — Z0189 Encounter for other specified special examinations: Secondary | ICD-10-CM

## 2021-06-12 DIAGNOSIS — I7121 Aneurysm of the ascending aorta, without rupture: Secondary | ICD-10-CM

## 2021-06-12 DIAGNOSIS — I10 Essential (primary) hypertension: Secondary | ICD-10-CM | POA: Diagnosis not present

## 2021-06-12 DIAGNOSIS — I712 Thoracic aortic aneurysm, without rupture: Secondary | ICD-10-CM | POA: Diagnosis not present

## 2021-06-12 DIAGNOSIS — I2510 Atherosclerotic heart disease of native coronary artery without angina pectoris: Secondary | ICD-10-CM

## 2021-06-12 LAB — BASIC METABOLIC PANEL
BUN/Creatinine Ratio: 16 (ref 10–24)
BUN: 16 mg/dL (ref 8–27)
CO2: 24 mmol/L (ref 20–29)
Calcium: 9.1 mg/dL (ref 8.6–10.2)
Chloride: 102 mmol/L (ref 96–106)
Creatinine, Ser: 1 mg/dL (ref 0.76–1.27)
Glucose: 96 mg/dL (ref 65–99)
Potassium: 4.2 mmol/L (ref 3.5–5.2)
Sodium: 141 mmol/L (ref 134–144)
eGFR: 82 mL/min/{1.73_m2} (ref >59)

## 2021-06-12 NOTE — Progress Notes (Signed)
BMET order placed for this pt to have drawn here in the office today. He is needing this for upcoming CT Angio Chest Aorta that will b done on 6/29, and per CT protocol. Pt is here now and lab tech will draw this specimen.

## 2021-06-19 ENCOUNTER — Encounter (HOSPITAL_COMMUNITY): Payer: Self-pay

## 2021-06-19 ENCOUNTER — Ambulatory Visit (HOSPITAL_COMMUNITY)
Admission: RE | Admit: 2021-06-19 | Discharge: 2021-06-19 | Disposition: A | Discharge status: Home / Self Care | Payer: Medicare Other | Source: Ambulatory Visit | Attending: Cardiology | Admitting: Cardiology

## 2021-06-19 ENCOUNTER — Other Ambulatory Visit: Payer: Self-pay

## 2021-06-19 DIAGNOSIS — I7121 Aneurysm of the ascending aorta, without rupture: Secondary | ICD-10-CM

## 2021-06-19 DIAGNOSIS — I712 Thoracic aortic aneurysm, without rupture, unspecified: Secondary | ICD-10-CM

## 2021-06-19 MED ORDER — IOHEXOL 350 MG/ML SOLN
80.0000 mL | Freq: Once | INTRAVENOUS | Status: AC | PRN
  Administered 2021-06-19: 80 mL via INTRAVENOUS

## 2021-06-20 ENCOUNTER — Encounter: Payer: Self-pay | Admitting: Cardiology

## 2021-06-20 DIAGNOSIS — I700 Atherosclerosis of aorta: Secondary | ICD-10-CM | POA: Insufficient documentation

## 2021-07-10 DIAGNOSIS — Z20822 Contact with and (suspected) exposure to covid-19: Secondary | ICD-10-CM | POA: Diagnosis not present

## 2021-08-26 ENCOUNTER — Other Ambulatory Visit: Payer: Self-pay | Admitting: Family Medicine

## 2021-08-26 DIAGNOSIS — E782 Mixed hyperlipidemia: Secondary | ICD-10-CM

## 2021-08-26 DIAGNOSIS — Z125 Encounter for screening for malignant neoplasm of prostate: Secondary | ICD-10-CM

## 2021-08-29 ENCOUNTER — Other Ambulatory Visit: Payer: Self-pay | Admitting: Family Medicine

## 2021-08-29 DIAGNOSIS — I2510 Atherosclerotic heart disease of native coronary artery without angina pectoris: Secondary | ICD-10-CM

## 2021-09-03 ENCOUNTER — Ambulatory Visit: Payer: Medicare Other

## 2021-09-03 ENCOUNTER — Other Ambulatory Visit: Payer: Medicare Other

## 2021-09-12 ENCOUNTER — Other Ambulatory Visit: Payer: Self-pay

## 2021-09-12 ENCOUNTER — Encounter: Payer: Self-pay | Admitting: Family Medicine

## 2021-09-12 ENCOUNTER — Ambulatory Visit (INDEPENDENT_AMBULATORY_CARE_PROVIDER_SITE_OTHER): Payer: Medicare Other | Admitting: Family Medicine

## 2021-09-12 VITALS — BP 122/70 | HR 68 | Temp 98.1°F | Ht 72.0 in | Wt 209.0 lb

## 2021-09-12 DIAGNOSIS — Z23 Encounter for immunization: Secondary | ICD-10-CM | POA: Diagnosis not present

## 2021-09-12 DIAGNOSIS — I2510 Atherosclerotic heart disease of native coronary artery without angina pectoris: Secondary | ICD-10-CM | POA: Diagnosis not present

## 2021-09-12 DIAGNOSIS — Z0000 Encounter for general adult medical examination without abnormal findings: Secondary | ICD-10-CM

## 2021-09-12 DIAGNOSIS — I77810 Thoracic aortic ectasia: Secondary | ICD-10-CM

## 2021-09-12 DIAGNOSIS — E782 Mixed hyperlipidemia: Secondary | ICD-10-CM | POA: Diagnosis not present

## 2021-09-12 DIAGNOSIS — Z7189 Other specified counseling: Secondary | ICD-10-CM

## 2021-09-12 DIAGNOSIS — Z136 Encounter for screening for cardiovascular disorders: Secondary | ICD-10-CM

## 2021-09-12 DIAGNOSIS — Z125 Encounter for screening for malignant neoplasm of prostate: Secondary | ICD-10-CM | POA: Diagnosis not present

## 2021-09-12 DIAGNOSIS — Z87891 Personal history of nicotine dependence: Secondary | ICD-10-CM

## 2021-09-12 DIAGNOSIS — Z1211 Encounter for screening for malignant neoplasm of colon: Secondary | ICD-10-CM

## 2021-09-12 LAB — COMPREHENSIVE METABOLIC PANEL
ALT: 15 U/L (ref 0–53)
AST: 16 U/L (ref 0–37)
Albumin: 4.5 g/dL (ref 3.5–5.2)
Alkaline Phosphatase: 36 U/L — ABNORMAL LOW (ref 39–117)
BUN: 16 mg/dL (ref 6–23)
CO2: 31 mEq/L (ref 19–32)
Calcium: 9.5 mg/dL (ref 8.4–10.5)
Chloride: 105 mEq/L (ref 96–112)
Creatinine, Ser: 0.93 mg/dL (ref 0.40–1.50)
GFR: 84.41 mL/min (ref >60.00)
Glucose, Bld: 85 mg/dL (ref 70–99)
Potassium: 4.5 mEq/L (ref 3.5–5.1)
Sodium: 142 mEq/L (ref 135–145)
Total Bilirubin: 0.9 mg/dL (ref 0.2–1.2)
Total Protein: 6.8 g/dL (ref 6.0–8.3)

## 2021-09-12 LAB — LIPID PANEL
Cholesterol: 137 mg/dL (ref 0–200)
HDL: 65 mg/dL (ref >39.00)
LDL Cholesterol: 63 mg/dL (ref 0–99)
NonHDL: 72.18
Total CHOL/HDL Ratio: 2
Triglycerides: 45 mg/dL (ref 0.0–149.0)
VLDL: 9 mg/dL (ref 0.0–40.0)

## 2021-09-12 LAB — PSA, MEDICARE: PSA: 1.73 ng/ml (ref 0.10–4.00)

## 2021-09-12 MED ORDER — NITROGLYCERIN 0.4 MG SL SUBL: 0.4000 mg | SUBLINGUAL_TABLET | SUBLINGUAL | 3 refills | Status: DC | PRN

## 2021-09-12 MED ORDER — EZETIMIBE 10 MG PO TABS: 10.0000 mg | ORAL_TABLET | Freq: Every day | ORAL | 3 refills | Status: DC

## 2021-09-12 MED ORDER — ATORVASTATIN CALCIUM 80 MG PO TABS: ORAL_TABLET | ORAL | 3 refills | Status: DC

## 2021-09-12 NOTE — Patient Instructions (Addendum)
We'll call about the GI appointment and the abdominal ultrasound.  They should call you.  Go to the lab on the way out.   If you have mychart we'll likely use that to update you.    Take care.  Glad to see you.

## 2021-09-12 NOTE — Progress Notes (Signed)
This visit occurred during the SARS-CoV-2 public health emergency.  Safety protocols were in place, including screening questions prior to the visit, additional usage of staff PPE, and extensive cleaning of exam room while observing appropriate contact time as indicated for disinfecting solutions.  I have personally reviewed the Medicare Annual Wellness questionnaire and have noted 1. The patient's medical and social history 2. Their use of alcohol, tobacco or illicit drugs 3. Their current medications and supplements 4. The patient's functional ability including ADL's, fall risks, home safety risks and hearing or visual             impairment. 5. Diet and physical activities 6. Evidence for depression or mood disorders  The patients weight, height, BMI have been recorded in the chart and visual acuity is per eye clinic.  I have made referrals, counseling and provided education to the patient based review of the above and I have provided the pt with a written personalized care plan for preventive services.  Provider list updated- see scanned forms.  Routine anticipatory guidance given to patient.  See health maintenance. The possibility exists that previously documented standard health maintenance information may have been brought forward from a previous encounter into this note.  If needed, that same information has been updated to reflect the current situation based on today's encounter.    Flu 2022 Shingles previously done PNA up-to-date Tetanus 2020 COVID-vaccine previously done Colonoscopy-referred 2022.   Prostate cancer screening pending 2022.   Advance directive-wife designated if patient were incapacitated. Cognitive function addressed- see scanned forms- and if abnormal then additional documentation follows.   In addition to [facility] Wellness, follow up visit for the below conditions:  Elevated Cholesterol: Using medications without problems: yes Muscle aches: no Diet  compliance: yes Exercise: yes Walking up to 4-6 miles at work  CAD.    Using medication without problems or lightheadedness: yes Chest pain with exertion:no Edema:no Short of breath:no Labs pending.    Thoracic aneurysm followed by cardiology.    Still working nights at [employer].    PMH and SH reviewed  Meds, vitals, and allergies reviewed.   ROS: Per HPI.  Unless specifically indicated otherwise in HPI, the patient denies:  General: fever. Eyes: acute vision changes ENT: sore throat Cardiovascular: chest pain Respiratory: SOB GI: vomiting GU: dysuria Musculoskeletal: acute back pain Derm: acute rash Neuro: acute motor dysfunction Psych: worsening mood Endocrine: polydipsia Heme: bleeding Allergy: hayfever  GEN: nad, alert and oriented HEENT: ncat NECK: supple w/o LA CV: rrr. PULM: ctab, no inc wob ABD: soft, +bs EXT: no edema SKIN: no acute rash

## 2021-09-15 NOTE — Assessment & Plan Note (Signed)
Thoracic aneurysm followed by cardiology.  I will defer.  Patient agrees.  No chest pain.

## 2021-09-15 NOTE — Assessment & Plan Note (Signed)
Continue Zetia and Lipitor.  Continue work on diet and exercise.

## 2021-09-15 NOTE — Addendum Note (Signed)
Addended by: Joaquim Nam on: 09/15/2021 10:28 PM   Modules accepted: Orders

## 2021-09-15 NOTE — Assessment & Plan Note (Signed)
No chest pain.  Continue metoprolol.  Continue statin.  He will update me as needed.  See notes on labs.

## 2021-09-15 NOTE — Assessment & Plan Note (Signed)
Flu 2022 Shingles previously done PNA up-to-date Tetanus 2020 COVID-vaccine previously done Colonoscopy-referred 2022.   Prostate cancer screening pending 2022.   Advance directive-wife designated if patient were incapacitated. Cognitive function addressed- see scanned forms- and if abnormal then additional documentation follows.

## 2021-09-15 NOTE — Assessment & Plan Note (Signed)
Advance directive- wife designated if patient were incapacitated.  

## 2021-10-16 DIAGNOSIS — Z23 Encounter for immunization: Secondary | ICD-10-CM | POA: Diagnosis not present

## 2021-11-07 ENCOUNTER — Other Ambulatory Visit: Payer: Self-pay

## 2021-11-07 MED ORDER — METOPROLOL SUCCINATE ER 25 MG PO TB24: 25.0000 mg | ORAL_TABLET | Freq: Every day | ORAL | 0 refills | Status: DC

## 2021-11-19 NOTE — Addendum Note (Signed)
Addended by: Maisie Fus on: 11/19/2021 03:42 PM   Modules accepted: Orders

## 2021-11-22 ENCOUNTER — Other Ambulatory Visit: Payer: Self-pay | Admitting: *Deleted

## 2021-11-22 MED ORDER — METOPROLOL SUCCINATE ER 25 MG PO TB24: 25.0000 mg | ORAL_TABLET | Freq: Every day | ORAL | 0 refills | Status: DC

## 2021-11-26 DIAGNOSIS — Z20822 Contact with and (suspected) exposure to covid-19: Secondary | ICD-10-CM | POA: Diagnosis not present

## 2021-12-02 ENCOUNTER — Ambulatory Visit: Payer: Medicare Other

## 2022-02-16 ENCOUNTER — Encounter: Payer: Self-pay | Admitting: Family Medicine

## 2022-02-17 DIAGNOSIS — Z20822 Contact with and (suspected) exposure to covid-19: Secondary | ICD-10-CM | POA: Diagnosis not present

## 2022-02-26 ENCOUNTER — Other Ambulatory Visit: Payer: Self-pay

## 2022-02-26 MED ORDER — METOPROLOL SUCCINATE ER 25 MG PO TB24: 25.0000 mg | ORAL_TABLET | Freq: Every day | ORAL | 0 refills | Status: DC

## 2022-03-01 DIAGNOSIS — Z20822 Contact with and (suspected) exposure to covid-19: Secondary | ICD-10-CM | POA: Diagnosis not present

## 2022-03-07 ENCOUNTER — Encounter: Payer: Self-pay | Admitting: Cardiology

## 2022-03-10 NOTE — Progress Notes (Deleted)
?Cardiology Office Note:   ? ?Date:  03/10/2022  ? ?IDPRESTON Pittman, DOB [birth year], MRN [id number] ? ?PCP:  Joaquim Nam, MD ?  ?[facility] Providers ?Cardiologist:  Tobias Alexander, MD { ? ?Referring MD: Joaquim Nam, MD  ? ? ?History of Present Illness:   ? ?Randy Pittman is a 71 y.o. male with a hx of CAD, ascending aortic dilation, HLD, PVCs and aortic atherosclerosis who was previously followed by Dr. Delton See who now presents to clinic for follow-up. ? ?Today, patient has history of abnormal nuclear stress test 12/27/15 showed moderate appearing inferior wall ischemia from the apex to the base and diffuse hypokinesis is worse in the apex EF 43%.  Left heart cath showed significant 2 vessel CAD with subtotal 99% OM1 and 65% mid RCA and total occlusion of the PLA with collaterals he had DES to the circumflex marginal and RCA may need stenting in the future if it progresses.  He had mild LV dysfunction ejection fraction 50%.   ? ?Was last seen by Dr. Delton See on 06/2020 where he was doing well without anginal symptoms. ? ?Today, *** ? ?Past Medical History:  ?Diagnosis Date  ? Aortic atherosclerosis (HCC)   ? chest CT 05/2021  ? Ascending aorta dilatation (HCC)   ? 76mm on Chest CTA 05/2021  ? CAD (coronary artery disease)   ? Hyperlipidemia 07/1995  ? Low testosterone   ? per Dr. Reuel Boom, with urology  ? ? ?Past Surgical History:  ?Procedure Laterality Date  ? CARDIAC CATHETERIZATION N/A 01/01/2017  ? Procedure: Left Heart Cath and Coronary Angiography;  Surgeon: Lennette Bihari, MD;  [facility];  Service: Cardiovascular;  Laterality: N/A;  ? CARDIAC CATHETERIZATION N/A 01/01/2017  ? Procedure: Coronary Stent Intervention;  Surgeon: Lennette Bihari, MD;  Location: [facility];  Service: Cardiovascular;  Laterality: N/A;  ? COLONOSCOPY    ? CORONARY ANGIOPLASTY WITH STENT PLACEMENT  01/01/2017  ? ? ?Current Medications: ?No outpatient medications have been marked as taking for the  03/12/22 encounter (Appointment) with Meriam Sprague, MD.  ?  ? ?Allergies:   Patient has no known allergies.  ? ?Social History  ? ?Socioeconomic History  ? Marital status: Married  ?  Spouse name: Not on file  ? Number of children: Not on file  ? Years of education: Not on file  ? Highest education level: Not on file  ?Occupational History  ? Occupation: Scientific laboratory technician [employer]  ?Tobacco Use  ? Smoking status: Former  ?  Packs/day: 2.00  ?  Years: 5.00  ?  Pack years: 10.00  ?  Types: Cigarettes  ?  Quit date: 64  ?  Years since quitting: 43.2  ? Smokeless tobacco: Former  ?  Types: Chew  ?  Quit date: 61  ?Vaping Use  ? Vaping Use: Never used  ?Substance and Sexual Activity  ? Alcohol use: Yes  ?  Comment: 1 a day or less.    ? Drug use: No  ? Sexual activity: Not on file  ?Other Topics Concern  ? Not on file  ?Social History Narrative  ? Lives with wife, daughter is in [place]  ? Married 1980  ? Works for [employer], works nights  ? 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 ?Social Determinants of Health  ? ?Financial Resource Strain: Not on file  ?Food Insecurity: Not on file  ?Transportation Needs: Not on file  ?Physical Activity:  Not on file  ?Stress: Not on file  ?Social Connections: Not on file  ?  ? ?Family History: ?The patient's ***family history includes Cirrhosis in his mother; Colon cancer in an other family member; Colon polyps in his brother; Diabetes in his mother; Heart disease in his brother; Hypertension in his mother; Parkinsonism in his father; Prostate cancer in an other family member. ? ?ROS:   ?Please see the history of present illness.    ?*** All other systems reviewed and are negative. ? ?EKGs/Labs/Other Studies Reviewed:   ? ?The following studies were reviewed today: ?Cardiac catheterization 01/01/17 ?Procedures  ?  ?Coronary Stent Intervention  ?Left Heart Cath and Coronary Angiography  ?Conclusion  ?  ?  ?There is mild left ventricular systolic dysfunction. ?Dist RCA lesion, 100  %stenosed. ?Mid RCA lesion, 65 %stenosed. ?A STENT SYNERGY DES 2.5X28 drug eluting stent was successfully placed. ?Lat 1st Mrg lesion, 99 %stenosed. ?Post intervention, there is a 0% residual stenosis. ?  ?Low-normal to mild LV dysfunction with an ejection fraction of 50% with a small focal region of distal inferior hypocontractility. ?  ?Significant 2 vessel coronary obstructive disease with a subtotal 99 100% stenosis involving the inferior branch of the obtuse marginal 1 vessel; and 65% mid RCA stenosis proximal to the acute margin with total occlusion of the PLA with retrograde collateralization to the distal PLA via the AV groove circumflex branches.  LAD with mild luminal irregularity. ?  ?Successful PCI to the circumflex marginal vessel with ultimate insertion of a Synergy DES 2.5?28 mm stent postdilated 2.75 mm with the 99% stenosis reduced to 0% and resumption of brisk TIMI-3 flow  ?  ?2-D echo 12/26/16   ?Study Conclusions ?  ?- Left ventricle: The cavity size was normal. Wall thickness was ?  increased in a pattern of mild LVH. Systolic function was mildly ?  to moderately reduced. The estimated ejection fraction was in the ?  range of 40% to 45%. Moderate hypokinesis of the ?  mid-apicalinferoseptal myocardium. Doppler parameters are ?  consistent with abnormal left ventricular relaxation (grade 1 ?  diastolic dysfunction). ?- Aortic valve: There was trivial regurgitation. ?- Right atrium: The atrium was mildly dilated. ?  ?TTE: 05/01/2017 ?- Left ventricle: The cavity size was normal. Wall thickness was ?  increased in a pattern of mild LVH. There was moderate concentric ?  hypertrophy. Systolic function was normal. The estimated ejection ?  fraction was in the range of 50% to 55%. Diffuse hypokinesis. ?  Doppler parameters are consistent with abnormal left ventricular ?  relaxation (grade 1 diastolic dysfunction). ?- Aortic valve: There was mild regurgitation. ?- Aorta: Ascending aortic diameter: 42 mm  (S). ?- Ascending aorta: The ascending aorta was mildly dilated. ?- Mitral valve: Calcified annulus. Mildly thickened, mildly ?  calcified leaflets . ?- Left atrium: The atrium was mildly dilated. ?  ?Nuclear stress test 12/26/16 ?Study Highlights  ?  ?Nuclear stress EF: 43%. ?Findings consistent with ischemia. ?This is an intermediate risk study. ?The left ventricular ejection fraction is moderately decreased (30-44%). ?Upsloping ST segment depression ST segment depression of 1 mm was noted during stress in the I, aVL, V5 and V6 leads. ?  ?Moderate appearing inferior wall ischemia from apex to base ?Diffuse hypokinesis worse in apex EF 43% ?   ?  ?EKG performed today June 25, 2018 shows normal sinus rhythm left axis deviation, otherwise normal EKG unchanged from prior.  This was personally reviewed. ? ? ?EKG:  EKG  is *** ordered today.  The ekg ordered today demonstrates *** ? ?Recent Labs: ?03/22/2021: Hemoglobin 15.1; Platelets 184; TSH 1.51 ?09/12/2021: ALT 15; BUN 16; Creatinine, Ser 0.93; Potassium 4.5; Sodium 142  ?Recent Lipid Panel ?   ?Component Value Date/Time  ? CHOL 137 09/12/2021 1016  ? CHOL 130 11/09/2017 0939  ? TRIG 45.0 09/12/2021 1016  ? HDL 65.00 09/12/2021 1016  ? HDL 61 11/09/2017 0939  ? CHOLHDL 2 09/12/2021 1016  ? VLDL 9.0 09/12/2021 1016  ? LDLCALC 63 09/12/2021 1016  ? LDLCALC 58 11/09/2017 0939  ? LDLDIRECT 169.4 07/09/2012 1021  ? ? ? ?Risk Assessment/Calculations:   ?{Does this patient have ATRIAL FIBRILLATION?[phone]} ? ?    ? ?Physical Exam:   ? ?VS:  There were no vitals taken for this visit.   ? ?Wt Readings from Last 3 Encounters:  ?09/12/21 209 lb (94.8 kg)  ?03/22/21 208 lb (94.3 kg)  ?02/07/21 216 lb (98 kg)  ?  ? ?GEN: *** Well nourished, well developed in no acute distress ?HEENT: Normal ?NECK: No JVD; No carotid bruits ?LYMPHATICS: No lymphadenopathy ?CARDIAC: ***RRR, no murmurs, rubs, gallops ?RESPIRATORY:  Clear to auscultation without rales, wheezing or rhonchi  ?ABDOMEN:  Soft, non-tender, non-distended ?MUSCULOSKELETAL:  No edema; No deformity  ?SKIN: Warm and dry ?NEUROLOGIC:  Alert and oriented x 3 ?PSYCHIATRIC:  Normal affect  ? ?ASSESSMENT:   ? ?No diagnosis found. ?PLAN:   ?

## 2022-03-11 NOTE — Progress Notes (Signed)
?Cardiology Office Note:   ? ?Date:  03/12/2022  ? ?Randy Pittman, DOB 1951/09/11, MRN [id number] ? ?PCP:  Joaquim Nam, MD ?  ?[facility] Providers ?Cardiologist:  Tobias Alexander, MD { ? ?Referring MD: Joaquim Nam, MD  ? ? ?History of Present Illness:   ? ?Randy Pittman is a 71 y.o. Pittman with a hx of CAD, ascending aortic dilation, HLD, PVCs and aortic atherosclerosis who was previously followed by Dr. Delton See who now presents to clinic for follow-up. ? ?Today, patient has history of abnormal nuclear stress test 12/27/15 showed moderate appearing inferior wall ischemia from the apex to the base and diffuse hypokinesis is worse in the apex EF 43%.  Left heart cath showed significant 2 vessel CAD with subtotal 99% OM1 and 65% mid RCA and total occlusion of the PLA with collaterals he had DES to the circumflex marginal and RCA may need stenting in the future if it progresses.  He had mild LV dysfunction ejection fraction 50%.   ? ?Was last seen by Dr. Delton See on 06/2020 where he was doing well without anginal symptoms. ? ?Today, he is accompanied by his wife and doing well. He denies chest pain, chest pressure, dyspnea at rest or with exertion, PND, or orthopnea. He has not needed to use his nitroglycerin.   ? ?He endorses occasional bilateral LE edema which is worse at the end of the day. ? ?His identical twin brother had a pacemaker. His mother was diabetic and had hypertension. His father had cardiovascular disease. For diet, he eats home-cooked meals and his wife stopped cooking him sweets. 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 His wife reports he lost 50 lbs. No exertional symptoms. ? ?Past Medical History:  ?Diagnosis Date  ? Aortic atherosclerosis (HCC)   ? chest CT 05/2021  ? Ascending aorta dilatation (HCC)   ? 45mm on Chest CTA 05/2021  ? CAD (coronary artery disease)   ? Hyperlipidemia 07/1995  ? Low testosterone   ? per Dr. Reuel Boom, with urology   ? ? ?Past Surgical History:  ?Procedure Laterality Date  ? CARDIAC CATHETERIZATION N/A 01/01/2017  ? Procedure: Left Heart Cath and Coronary Angiography;  Surgeon: Lennette Bihari, MD;  Location: [facility];  Service: Cardiovascular;  Laterality: N/A;  ? CARDIAC CATHETERIZATION N/A 01/01/2017  ? Procedure: Coronary Stent Intervention;  Surgeon: Lennette Bihari, MD;  Location: [facility];  Service: Cardiovascular;  Laterality: N/A;  ? COLONOSCOPY    ? CORONARY ANGIOPLASTY WITH STENT PLACEMENT  01/01/2017  ? ? ?Current Medications: ?Current Meds  ?Medication Sig  ? aspirin 81 MG tablet Take 81 mg by mouth daily.  ? atorvastatin (LIPITOR) 80 MG tablet TAKE 1 TABLET DAILY AT 6 P.M.  ? cholecalciferol (VITAMIN D) 1000 UNITS tablet Take 1,000 Units by mouth daily.  ? ezetimibe (ZETIA) 10 MG tablet Take 1 tablet (10 mg total) by mouth daily.  ? metoprolol succinate (TOPROL XL) 25 MG 24 hr tablet Take 1 tablet (25 mg total) by mouth daily. Please keep upcoming appt with Dr. Shari Prows new Cardiologist in March 2023 before anymore refills. Thank you  ? nitroGLYCERIN (NITROSTAT) 0.4 MG SL tablet Place 1 tablet (0.4 mg total) under the tongue every 5 (five) minutes as needed for chest pain.  ? vitamin B-12 (CYANOCOBALAMIN) 500 MCG tablet Take 500 mcg by mouth daily.  ?  ? ?Allergies:   Patient  has no known allergies.  ? ?Social History  ? ?Socioeconomic History  ? Marital status: Married  ?  Spouse name: Not on file  ? Number of children: Not on file  ? Years of education: Not on file  ? Highest education level: Not on file  ?Occupational History  ? Occupation: Scientific laboratory technician [employer]  ?Tobacco Use  ? Smoking status: Former  ?  Packs/day: 2.00  ?  Years: 5.00  ?  Pack years: 10.00  ?  Types: Cigarettes  ?  Quit date: 52  ?  Years since quitting: 43.2  ? Smokeless tobacco: Former  ?  Types: Chew  ?  Quit date: 2  ?Vaping Use  ? Vaping Use: Never used  ?Substance and Sexual Activity  ? Alcohol use: Yes  ?  Comment: 1 a  day or less.    ? Drug use: No  ? Sexual activity: Not on file  ?Other Topics Concern  ? Not on file  ?Social History Narrative  ? Lives with wife, daughter is in [place]  ? Married 1980  ? Works for [employer], works nights  ? 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 ?Social Determinants of Health  ? ?Financial Resource Strain: Not on file  ?Food Insecurity: Not on file  ?Transportation Needs: Not on file  ?Physical Activity: Not on file  ?Stress: Not on file  ?Social Connections: Not on file  ?  ? ?Family History: ?The patient's family history includes Cirrhosis in his mother; Colon cancer in an other family member; Colon polyps in his brother; Diabetes in his mother; Heart disease in his brother; Hypertension in his mother; Parkinsonism in his father; Prostate cancer in an other family member. ? ?ROS:   ?Please see the history of present illness.    ?Review of Systems  ?Constitutional:  Negative for malaise/fatigue and weight loss.  ?HENT:  Negative for congestion and sore throat.   ?Eyes:  Negative for blurred vision.  ?Respiratory:  Negative for cough and shortness of breath.   ?Cardiovascular:  Positive for leg swelling (Bilateral). Negative for chest pain, palpitations, orthopnea, claudication and PND.  ?Gastrointestinal:  Negative for heartburn and nausea.  ?Genitourinary:  Negative for dysuria and urgency.  ?Musculoskeletal:  Negative for joint pain and myalgias.  ?Skin:  Negative for itching and rash.  ?Neurological:  Negative for dizziness.  ?Endo/Heme/Allergies:  Does not bruise/bleed easily.  ?Psychiatric/Behavioral:  The patient is not nervous/anxious and does not have insomnia.   ?All other systems reviewed and are negative. ? ?EKGs/Labs/Other Studies Reviewed:   ? ?The following studies were reviewed today: ?CTA Chest 06/19/21 ?IMPRESSION: ?Vascular: ?1. Unchanged fusiform ascending thoracic aortic aneurysm measuring up to 43 mm. Recommend annual imaging followup by CTA or MRA. This recommendation  follows 2010 ?ACCF/AHA/AATS/ACR/ASA/SCA/SCAI/SIR/STS/SVM Guidelines for the Diagnosis and Management of Patients with Thoracic Aortic Disease. ?Circulation. 2010; 121: R678-L381. Aortic aneurysm NOS (ICD10-I71.9) ?2. Coronary and aortic atherosclerosis (ICD10-I70.0). ?  ?Non-Vascular: ?No acute intrathoracic abnormality. ? ?Echo 06/02/18 ?- Left ventricle: The cavity size was normal. There was moderate concentric hypertrophy. Systolic function was normal. The estimated ejection fraction was in the range of 55% to 60%. Wall motion was normal; there were no regional wall motion abnormalities. Doppler parameters are consistent with abnormal left ventricular relaxation (grade 1 diastolic dysfunction). There was no evidence of elevated ventricular filling pressure by Doppler parameters.  ?- Aortic valve: There was mild regurgitation.  ?- Aortic root: The aortic root was dilated measuring 41 mm.  ?-  Ascending aorta: The atrium was mildly dilated measuring 44 mm.  ?- Mitral valve: There was no regurgitation.  ?- Left atrium: The atrium was mildly dilated.  ?- Right ventricle: The cavity size was normal. Wall thickness was normal. Systolic function was normal.  ?- Right atrium: The atrium was normal in size.  ?- Tricuspid valve: There was no regurgitation.  ?- Pulmonic valve: There was no regurgitation.  ?- Pulmonary arteries: Systolic pressure could not be accurately estimated.  ?- Inferior vena cava: The vessel was normal in size.  ?- Pericardium, extracardiac: There was no pericardial effusion. ? ?Impressions:  ?- Since the prior study on 05/01/2017 ascending aorta has further dilated from 42 mm to 44 mm. A dedicated CTA or MRA is recommended for further evaluation.  ? ?Lexiscan Myoview 11/17/17 ?Nuclear stress EF: 46%. ?Blood pressure demonstrated a normal response to exercise. ?No T wave inversion was noted during stress. ?There was no ST segment deviation noted during stress. ?Defect 1: There is a large defect of  moderate severity. ?This is an intermediate risk study. ?  ?Large size, moderate severity fixed inferior perfusion defect which is most likely scar, no reversible ischemia. LVEF 46% with inferior hypokinesis. T

## 2022-03-12 ENCOUNTER — Encounter: Payer: Self-pay | Admitting: Cardiology

## 2022-03-12 ENCOUNTER — Other Ambulatory Visit: Payer: Self-pay

## 2022-03-12 ENCOUNTER — Ambulatory Visit (INDEPENDENT_AMBULATORY_CARE_PROVIDER_SITE_OTHER): Payer: Medicare Other | Admitting: Cardiology

## 2022-03-12 VITALS — BP 128/84 | HR 82 | Ht 72.0 in | Wt 214.4 lb

## 2022-03-12 DIAGNOSIS — I2510 Atherosclerotic heart disease of native coronary artery without angina pectoris: Secondary | ICD-10-CM | POA: Diagnosis not present

## 2022-03-12 DIAGNOSIS — E782 Mixed hyperlipidemia: Secondary | ICD-10-CM | POA: Diagnosis not present

## 2022-03-12 DIAGNOSIS — I7121 Aneurysm of the ascending aorta, without rupture: Secondary | ICD-10-CM

## 2022-03-12 NOTE — Patient Instructions (Signed)
Medication Instructions:  ? ?Your physician recommends that you continue on your current medications as directed. Please refer to the Current Medication list given to you today. ? ?*If you need a refill on your cardiac medications before your next appointment, please call your pharmacy* ? ? ?Lab Work: ? ?TODAY--LIPIDS ? ?If you have labs (blood work) drawn today and your tests are completely normal, you will receive your results only by: ?MyChart Message (if you have MyChart) OR ?A paper copy in the mail ?If you have any lab test that is abnormal or we need to change your treatment, we will call you to review the results. ? ? ?Testing/Procedures: ? ?CT ANGIO CHEST AORTA W/WO CONTRAST TO BE DONE IN June 2023 PER DR. Shari Prows ? ? ?Follow-Up: ?At [facility], you and your health needs are our priority.  As part of our continuing mission to provide you with exceptional heart care, we have created designated Provider Care Teams.  These Care Teams include your primary Cardiologist (physician) and Advanced Practice Providers (APPs -  Physician Assistants and Nurse Practitioners) who all work together to provide you with the care you need, when you need it. ? ?We recommend signing up for the patient portal called "MyChart".  Sign up information is provided on this After Visit Summary.  MyChart is used to connect with patients for Virtual Visits (Telemedicine).  Patients are able to view lab/test results, encounter notes, upcoming appointments, etc.  Non-urgent messages can be sent to your provider as well.   ?To learn more about what you can do with MyChart, go to [email or web address].   ? ?Your next appointment:   ?6 month(s) ? ?The format for your next appointment:   ?In Person ? ?Provider:   ?DR. PEMBERTON ? ?

## 2022-03-13 LAB — LIPID PANEL
Chol/HDL Ratio: 2.1 ratio (ref 0.0–5.0)
Cholesterol, Total: 128 mg/dL (ref 100–199)
HDL: 62 mg/dL (ref >39)
LDL Chol Calc (NIH): 55 mg/dL (ref 0–99)
Triglycerides: 44 mg/dL (ref 0–149)
VLDL Cholesterol Cal: 11 mg/dL (ref 5–40)

## 2022-04-01 DIAGNOSIS — Z20822 Contact with and (suspected) exposure to covid-19: Secondary | ICD-10-CM | POA: Diagnosis not present

## 2022-04-07 DIAGNOSIS — Z20822 Contact with and (suspected) exposure to covid-19: Secondary | ICD-10-CM | POA: Diagnosis not present

## 2022-04-17 ENCOUNTER — Ambulatory Visit (INDEPENDENT_AMBULATORY_CARE_PROVIDER_SITE_OTHER): Payer: Medicare Other | Admitting: Nurse Practitioner

## 2022-04-17 ENCOUNTER — Ambulatory Visit (HOSPITAL_BASED_OUTPATIENT_CLINIC_OR_DEPARTMENT_OTHER)
Admission: RE | Admit: 2022-04-17 | Discharge: 2022-04-17 | Disposition: A | Discharge status: Home / Self Care | Payer: Medicare Other | Source: Ambulatory Visit | Attending: Nurse Practitioner | Admitting: Nurse Practitioner

## 2022-04-17 VITALS — BP 128/76 | HR 104 | Temp 100.9°F | Resp 14 | Ht 72.0 in | Wt 215.4 lb

## 2022-04-17 DIAGNOSIS — N492 Inflammatory disorders of scrotum: Secondary | ICD-10-CM | POA: Insufficient documentation

## 2022-04-17 DIAGNOSIS — N50812 Left testicular pain: Secondary | ICD-10-CM | POA: Insufficient documentation

## 2022-04-17 LAB — POCT URINALYSIS DIPSTICK
Bilirubin, UA: NEGATIVE
Blood, UA: NEGATIVE
Glucose, UA: NEGATIVE
Ketones, UA: NEGATIVE
Nitrite, UA: POSITIVE
Protein, UA: POSITIVE — AB
Spec Grav, UA: 1.02 (ref 1.010–1.025)
Urobilinogen, UA: 4 E.U./dL — AB
pH, UA: 5.5 (ref 5.0–8.0)

## 2022-04-17 MED ORDER — SULFAMETHOXAZOLE-TRIMETHOPRIM 800-160 MG PO TABS: 1.0000 | ORAL_TABLET | Freq: Two times a day (BID) | ORAL | 0 refills | Status: AC

## 2022-04-17 MED ORDER — CEFTRIAXONE SODIUM 1 G IJ SOLR
1.0000 g | Freq: Once | INTRAMUSCULAR | Status: AC
  Administered 2022-04-17: 1 g via INTRAMUSCULAR

## 2022-04-17 NOTE — Patient Instructions (Signed)
Nice to see you today ?I will be in touch with the Ultrasound results. ?I gave you a dose of antibiotic today. Sent a script to the pharmacy to start in the morning ?Follow up if no improvement ?

## 2022-04-17 NOTE — Assessment & Plan Note (Signed)
Patient's left testicle swollen erythematous and tender.  Differentials include cellulitis, epididymitis, testicular mass.  We will treat antibiotics and send for stat US scrotal ultrasound with Doppler.  Pending report ?

## 2022-04-17 NOTE — Progress Notes (Signed)
? ?Acute Office Visit ? ?Subjective:  ? ?  ?Patient ID: Randy Pittman, male    DOB: 1951/12/22, 71 y.o.   MRN: [id number] ? ?Chief Complaint  ?Patient presents with  ? Testicle Pain  ?  Sx started yesterday-04/16/22-Left side, pain and swelling present, some pain in the groin area and a little bit radiating to the top of left leg.  ? ? ? ?Patient is in today for Testicle pain ? ?Started Tuesday night then yesterday morning he really noticed it and started to increase in pain and discomfort.  No injury per patient report. States that he has been lifting 30 pounds of water from the store, this was several cases but nothing other than that. ?No dysuria ?When he raises the position he has urinary urgency. States increased in nocturia 3 times the last. ? ? ? ?Review of Systems  ?Constitutional:  Positive for chills and fever (subjective). Negative for malaise/fatigue.  ?Gastrointestinal:  Negative for abdominal pain, diarrhea, nausea and vomiting.  ?Genitourinary:  Positive for testicular pain. Negative for dysuria and hematuria.  ?     Nocturia  ? ? ?   ?Objective:  ?  ?BP 128/76   Pulse (!) 104   Temp (!) 100.9 ?F (38.3 ?C)   Resp 14   Ht 6' (1.829 m)   Wt 215 lb 6 oz (97.7 kg)   BMI 29.21 kg/m?  ? ? ?Physical Exam ?Vitals and nursing note reviewed. Exam conducted with a chaperone present [facility] [place], RMA).  ?Constitutional:   ?   Appearance: Normal appearance.  ?Cardiovascular:  ?   Rate and Rhythm: Normal rate and regular rhythm.  ?   Heart sounds: Normal heart sounds.  ?Pulmonary:  ?   Effort: Pulmonary effort is normal.  ?   Breath sounds: Normal breath sounds.  ?Abdominal:  ?   General: Bowel sounds are normal. There is no distension.  ?   Palpations: There is no mass.  ?   Tenderness: There is no abdominal tenderness.  ?   Hernia: No hernia is present. There is no hernia in the left inguinal area or right inguinal area.  ?Genitourinary: ?   Penis: Normal.   ?   Testes:     ?   Left: Mass, tenderness  and swelling present.  ?   Epididymis:  ?   Right: Normal.  ?   Left: Mass present.  ?   Prostate: Normal.  ?   Comments: Erythema and edema to left scrotal area no lymphadenopathy appreciated ?Lymphadenopathy:  ?   Lower Body: No right inguinal adenopathy. No left inguinal adenopathy.  ?Skin: ?   General: Skin is warm.  ?Neurological:  ?   Mental Status: He is alert.  ? ? ?Results for orders placed or performed in visit on 04/17/22  ?POCT urinalysis dipstick  ?Result Value Ref Range  ? Color, UA orange   ? Clarity, UA cloudy   ? Glucose, UA Negative Negative  ? Bilirubin, UA negative   ? Ketones, UA negative   ? Spec Grav, UA 1.020 1.010 - 1.025  ? Blood, UA negative   ? pH, UA 5.5 5.0 - 8.0  ? Protein, UA Positive (A) Negative  ? Urobilinogen, UA 4.0 (A) 0.2 or 1.0 E.U./dL  ? Nitrite, UA positive   ? Leukocytes, UA Small (1+) (A) Negative  ? Appearance    ? Odor    ? ? ? ?   ?Assessment & Plan:  ? ?Problem List Items  Addressed This Visit   ? ?  ? Genitourinary  ? Scrotal infection  ?  Cellulitis versus epididymitis versus mass of testicle.  Patient was febrile in office.  Did encourage fluid intake and using Tylenol/ibuprofen as needed for the fever and scrotal pain.  Did administer ceftriaxone 1 g IM x1 dose in office.  We will start patient on Bactrim twice daily for 10 days.  Pending ultrasound of scrotum.  Did discuss signs and symptoms when to be seen emergently.  Patient's wife is at bedside ? ?  ?  ? Relevant Medications  ? sulfamethoxazole-trimethoprim (BACTRIM DS) 800-160 MG tablet  ? Other Relevant Orders  ? US SCROTUM W/DOPPLER  ?  ? Other  ? Pain in left testicle - Primary  ?  Patient's left testicle swollen erythematous and tender.  Differentials include cellulitis, epididymitis, testicular mass.  We will treat antibiotics and send for stat US scrotal ultrasound with Doppler.  Pending report ? ?  ?  ? Relevant Orders  ? POCT urinalysis dipstick (Completed)  ? Urine Culture  ? US Scrotum  ? US SCROTUM  W/DOPPLER  ? ? ?Meds ordered this encounter  ?Medications  ? sulfamethoxazole-trimethoprim (BACTRIM DS) 800-160 MG tablet  ?  Sig: Take 1 tablet by mouth 2 (two) times daily for 10 days.  ?  Dispense:  20 tablet  ?  Refill:  0  ?  Order Specific Question:   Supervising Provider  ?  Answer:   Roxy Manns A [[id number]]  ? cefTRIAXone (ROCEPHIN) injection 1 g  ? ? ?No follow-ups on file. ? ?Audria Nine, NP ? ? ?

## 2022-04-17 NOTE — Assessment & Plan Note (Signed)
Cellulitis versus epididymitis versus mass of testicle.  Patient was febrile in office.  Did encourage fluid intake and using Tylenol/ibuprofen as needed for the fever and scrotal pain.  Did administer ceftriaxone 1 g IM x1 dose in office.  We will start patient on Bactrim twice daily for 10 days.  Pending ultrasound of scrotum.  Did discuss signs and symptoms when to be seen emergently.  Patient's wife is at bedside ?

## 2022-04-19 LAB — URINE CULTURE
MICRO NUMBER:: 13321071
SPECIMEN QUALITY:: ADEQUATE

## 2022-04-24 DIAGNOSIS — Z20822 Contact with and (suspected) exposure to covid-19: Secondary | ICD-10-CM | POA: Diagnosis not present

## 2022-04-28 DIAGNOSIS — Z20822 Contact with and (suspected) exposure to covid-19: Secondary | ICD-10-CM | POA: Diagnosis not present

## 2022-05-21 ENCOUNTER — Other Ambulatory Visit: Payer: Medicare Other | Admitting: *Deleted

## 2022-05-21 DIAGNOSIS — E782 Mixed hyperlipidemia: Secondary | ICD-10-CM

## 2022-05-21 DIAGNOSIS — I2510 Atherosclerotic heart disease of native coronary artery without angina pectoris: Secondary | ICD-10-CM

## 2022-05-21 DIAGNOSIS — I7121 Aneurysm of the ascending aorta, without rupture: Secondary | ICD-10-CM | POA: Diagnosis not present

## 2022-05-21 LAB — BASIC METABOLIC PANEL
BUN/Creatinine Ratio: 13 (ref 10–24)
BUN: 13 mg/dL (ref 8–27)
CO2: 26 mmol/L (ref 20–29)
Calcium: 9.2 mg/dL (ref 8.6–10.2)
Chloride: 105 mmol/L (ref 96–106)
Creatinine, Ser: 1.02 mg/dL (ref 0.76–1.27)
Glucose: 97 mg/dL (ref 70–99)
Potassium: 4.5 mmol/L (ref 3.5–5.2)
Sodium: 142 mmol/L (ref 134–144)
eGFR: 80 mL/min/{1.73_m2} (ref >59)

## 2022-05-27 ENCOUNTER — Ambulatory Visit (HOSPITAL_COMMUNITY)
Admission: RE | Admit: 2022-05-27 | Discharge: 2022-05-27 | Disposition: A | Discharge status: Home / Self Care | Payer: Medicare Other | Source: Ambulatory Visit | Attending: Cardiology | Admitting: Cardiology

## 2022-05-27 DIAGNOSIS — I7121 Aneurysm of the ascending aorta, without rupture: Secondary | ICD-10-CM | POA: Diagnosis not present

## 2022-05-27 MED ORDER — IOHEXOL 350 MG/ML SOLN
100.0000 mL | Freq: Once | INTRAVENOUS | Status: AC | PRN
  Administered 2022-05-27: 100 mL via INTRAVENOUS

## 2022-05-28 ENCOUNTER — Telehealth: Payer: Self-pay | Admitting: *Deleted

## 2022-05-28 DIAGNOSIS — I7121 Aneurysm of the ascending aorta, without rupture: Secondary | ICD-10-CM

## 2022-05-28 NOTE — Telephone Encounter (Signed)
The patients wife Lupita Leash (on [place]) has been notified of the result and verbalized understanding.  All questions (if any) were answered.  Lupita Leash is aware that I will go ahead and place the order for repeat CT ANGIO CHEST AORTA in the system to be done in one year, and send a message to our [facility] Schedulers to call them back and arrange this appt for that time.  Lupita Leash verbalized understanding and agrees with this plan.  Lupita Leash will relay this result to the pt when he wakes up.

## 2022-05-28 NOTE — Telephone Encounter (Signed)
-----   Message from Freada Bergeron, MD sent at 05/27/2022  8:20 PM EDT ----- His CT scan shows mild dilation of his aorta at 4.3cm. We will continue monitoring this with yearly CT or MRA scans going forward.

## 2022-06-17 ENCOUNTER — Other Ambulatory Visit: Payer: Self-pay

## 2022-06-17 MED ORDER — METOPROLOL SUCCINATE ER 25 MG PO TB24: 25.0000 mg | ORAL_TABLET | Freq: Every day | ORAL | 3 refills | Status: DC

## 2022-08-19 NOTE — Progress Notes (Unsigned)
Cardiology Office Note:    Date:  08/19/2022   ID:  Randy Pittman, DOB 1951-05-23, MRN [id number]  PCP:  Joaquim Nam, MD   [facility] Providers Cardiologist:  Tobias Alexander, MD {  Referring MD: Joaquim Nam, MD    History of Present Illness:    Randy Pittman is a 71 y.o. male with a hx of CAD, ascending aortic dilation, HLD, PVCs and aortic atherosclerosis who was previously followed by Dr. Delton See who now presents to clinic for follow-up.  Today, patient has history of abnormal nuclear stress test 12/27/15 showed moderate appearing inferior wall ischemia from the apex to the base and diffuse hypokinesis is worse in the apex EF 43%.  Left heart cath showed significant 2 vessel CAD with subtotal 99% OM1 and 65% mid RCA and total occlusion of the PLA with collaterals he had DES to the circumflex marginal and RCA may need stenting in the future if it progresses.  He had mild LV dysfunction ejection fraction 50%.    Was last seen by Dr. Delton See on 06/2020 where he was doing well without anginal symptoms.  Today, he is accompanied by his wife and doing well. He denies chest pain, chest pressure, dyspnea at rest or with exertion, PND, or orthopnea. He has not needed to use his nitroglycerin.    He endorses occasional bilateral LE edema which is worse at the end of the day.  His identical twin brother had a pacemaker. His mother was diabetic and had hypertension. His father had cardiovascular disease. For diet, he eats home-cooked meals and his wife stopped cooking him sweets. 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 His wife reports he lost 50 lbs. No exertional symptoms.  Past Medical History:  Diagnosis Date   Aortic atherosclerosis (HCC)    chest CT 05/2021   Ascending aorta dilatation (HCC)    54mm on Chest CTA 05/2021   CAD (coronary artery disease)    Hyperlipidemia 07/1995   Low testosterone    per Dr. Reuel Boom, with urology     Past Surgical History:  Procedure Laterality Date   CARDIAC CATHETERIZATION N/A 01/01/2017   Procedure: Left Heart Cath and Coronary Angiography;  Surgeon: Lennette Bihari, MD;  Location[facility] INVASIVE CV LAB;  Service: Cardiovascular;  Laterality: N/A;   CARDIAC CATHETERIZATION N/A 01/01/2017   Procedure: Coronary Stent Intervention;  Surgeon: Lennette Bihari, MD;  Location: [facility] INVASIVE CV LAB;  Service: Cardiovascular;  Laterality: N/A;   COLONOSCOPY     CORONARY ANGIOPLASTY WITH STENT PLACEMENT  01/01/2017    Current Medications: No outpatient medications have been marked as taking for the 08/21/22 encounter (Appointment) with Meriam Sprague, MD.     Allergies:   Patient has no known allergies.   Social History   Socioeconomic History   Marital status: Married    Spouse name: Not on file   Number of children: Not on file   Years of education: Not on file   Highest education level: Not on file  Occupational History   Occupation: Scientific laboratory technician [employer]  Tobacco Use   Smoking status: Former    Packs/day: 2.00    Years: 5.00    Total pack years: 10.00    Types: Cigarettes    Quit date: 1980    Years since quitting: 43.6   Smokeless tobacco: Former    Types: Chew    Quit date: 1975  Advertising account planner  Vaping Use: Never used  Substance and Sexual Activity   Alcohol use: Yes    Comment: 1 a day or less.     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, E6   Social Determinants of Health   Financial Resource Strain: Low Risk  (08/28/2020)   Overall Financial Resource Strain (CARDIA)    Difficulty of Paying Living Expenses: Not hard at all  Food Insecurity: No Food Insecurity (08/28/2020)   Hunger Vital Sign    Worried About Running Out of Food in the Last Year: Never true    Ran Out of  Food in the Last Year: Never true  Transportation Needs: No Transportation Needs (08/28/2020)   PRAPARE - Administrator, Civil Service (Medical): No    Lack of Transportation (Non-Medical): No  Physical Activity: Sufficiently Active (08/28/2020)   Exercise Vital Sign    Days of Exercise per Week: 7 days    Minutes of Exercise per Session: 60 min  Stress: No Stress Concern Present (08/28/2020)   [employer] of Occupational Health - Occupational Stress Questionnaire    Feeling of Stress : Not at all  Social Connections: Not on file     Family History: The patient's family history includes Cirrhosis in his mother; Colon cancer in an other family member; Colon polyps in his brother; Diabetes in his mother; Heart disease in his brother; Hypertension in his mother; Parkinsonism in his father; Prostate cancer in an other family member.  ROS:   Please see the history of present illness.    Review of Systems  Constitutional:  Negative for malaise/fatigue and weight loss.  HENT:  Negative for congestion and sore throat.   Eyes:  Negative for blurred vision.  Respiratory:  Negative for cough and shortness of breath.   Cardiovascular:  Positive for leg swelling (Bilateral). Negative for chest pain, palpitations, orthopnea, claudication and PND.  Gastrointestinal:  Negative for heartburn and nausea.  Genitourinary:  Negative for dysuria and urgency.  Musculoskeletal:  Negative for joint pain and myalgias.  Skin:  Negative for itching and rash.  Neurological:  Negative for dizziness.  Endo/Heme/Allergies:  Does not bruise/bleed easily.  Psychiatric/Behavioral:  The patient is not nervous/anxious and does not have insomnia.    All other systems reviewed and are negative.  EKGs/Labs/Other Studies Reviewed:    The following studies were reviewed today: CTA Chest 06/19/21 IMPRESSION: Vascular: 1. Unchanged fusiform ascending thoracic aortic aneurysm measuring up to 43 mm. Recommend  annual imaging followup by CTA or MRA. This recommendation follows 2010 ACCF/AHA/AATS/ACR/ASA/SCA/SCAI/SIR/STS/SVM Guidelines for the Diagnosis and Management of Patients with Thoracic Aortic Disease. Circulation. 2010; 121: D622-W979. Aortic aneurysm NOS (ICD10-I71.9) 2. Coronary and aortic atherosclerosis (ICD10-I70.0).   Non-Vascular: No acute intrathoracic abnormality.  Echo 06/02/18 - Left ventricle: The cavity size was normal. There was moderate concentric hypertrophy. Systolic function was normal. The estimated ejection fraction was in the range of 55% to 60%. Wall motion was normal; there were no regional wall motion abnormalities. Doppler parameters are consistent with abnormal left ventricular relaxation (grade 1 diastolic dysfunction). There was no evidence of elevated ventricular filling pressure by Doppler parameters.  - Aortic valve: There was mild regurgitation.  - Aortic root: The aortic root was dilated  measuring 41 mm.  - Ascending aorta: The atrium was mildly dilated measuring 44 mm.  - Mitral valve: There was no regurgitation.  - Left atrium: The atrium was mildly dilated.  - Right ventricle: The cavity size was normal. Wall thickness was normal. Systolic function was normal.  - Right atrium: The atrium was normal in size.  - Tricuspid valve: There was no regurgitation.  - Pulmonic valve: There was no regurgitation.  - Pulmonary arteries: Systolic pressure could not be accurately estimated.  - Inferior vena cava: The vessel was normal in size.  - Pericardium, extracardiac: There was no pericardial effusion.  Impressions:  - Since the prior study on 05/01/2017 ascending aorta has further dilated from 42 mm to 44 mm. A dedicated CTA or MRA is recommended for further evaluation.   Lexiscan Myoview 11/17/17 Nuclear stress EF: 46%. Blood pressure demonstrated a normal response to exercise. No T wave inversion was noted during stress. There was no ST segment deviation noted  during stress. Defect 1: There is a large defect of moderate severity. This is an intermediate risk study.   Large size, moderate severity fixed inferior perfusion defect which is most likely scar, no reversible ischemia. LVEF 46% with inferior hypokinesis. This is an intermediate risk study.  Echo 05/01/17 - Left ventricle: The cavity size was normal. Wall thickness was   increased in a pattern of mild LVH. There was moderate concentric   hypertrophy. Systolic function was normal. The estimated ejection   fraction was in the range of 50% to 55%. Diffuse hypokinesis.   Doppler parameters are consistent with abnormal left ventricular   relaxation (grade 1 diastolic dysfunction).  - Aortic valve: There was mild regurgitation.  - Aorta: Ascending aortic diameter: 42 mm (S).  - Ascending aorta: The ascending aorta was mildly dilated.  - Mitral valve: Calcified annulus. Mildly thickened, mildly    calcified leaflets .  - Left atrium: The atrium was mildly dilated.   Cardiac catheterization 01/01/17 Procedures    Coronary Stent Intervention  Left Heart Cath and Coronary Angiography  Conclusion      There is mild left ventricular systolic dysfunction. Dist RCA lesion, 100 %stenosed. Mid RCA lesion, 65 %stenosed. A STENT SYNERGY DES [id number] drug eluting stent was successfully placed. Lat 1st Mrg lesion, 99 %stenosed. Post intervention, there is a 0% residual stenosis.   Low-normal to mild LV dysfunction with an ejection fraction of 50% with a small focal region of distal inferior hypocontractility.   Significant 2 vessel coronary obstructive disease with a subtotal 99 100% stenosis involving the inferior branch of the obtuse marginal 1 vessel; and 65% mid RCA stenosis proximal to the acute margin with total occlusion of the PLA with retrograde collateralization to the distal PLA via the AV groove circumflex branches.  LAD with mild luminal irregularity.   Successful PCI to the circumflex  marginal vessel with ultimate insertion of a Synergy DES 2.528 mm stent postdilated 2.75 mm with the 99% stenosis reduced to 0% and resumption of brisk TIMI-3 flow    2-D echo 12/26/16   Study Conclusions   - Left ventricle: The cavity size was normal. Wall thickness was increased in a pattern of mild LVH. Systolic function was mildly to moderately reduced. The estimated ejection fraction was in the range of 40% to 45%. Moderate hypokinesis of the mid-apicalinferoseptal myocardium. Doppler parameters are consistent with abnormal left ventricular relaxation (grade 1 diastolic dysfunction). - Aortic valve: There was trivial regurgitation. - Right atrium: The atrium  was mildly dilated.   TTE: 05/01/2017 - Left ventricle: The cavity size was normal. Wall thickness was increased in a pattern of mild LVH. There was moderate concentric hypertrophy. Systolic function was normal. The estimated ejection fraction was in the range of 50% to 55%. Diffuse hypokinesis. Doppler parameters are consistent with abnormal left ventricular relaxation (grade 1 diastolic dysfunction). - Aortic valve: There was mild regurgitation. - Aorta: Ascending aortic diameter: 42 mm (S). - Ascending aorta: The ascending aorta was mildly dilated. - Mitral valve: Calcified annulus. Mildly thickened, mildly   calcified leaflets . - Left atrium: The atrium was mildly dilated.   Nuclear stress test 12/26/16 Study Highlights    Nuclear stress EF: 43%. Findings consistent with ischemia. This is an intermediate risk study. The left ventricular ejection fraction is moderately decreased (30-44%). Upsloping ST segment depression ST segment depression of 1 mm was noted during stress in the I, aVL, V5 and V6 leads.   Moderate appearing inferior wall ischemia from apex to base Diffuse hypokinesis worse in apex EF 43%       EKG:   03/12/22: NSR, rate 82 bpm; occasional PVCs EKG performed June 25, 2018 shows normal sinus rhythm left axis  deviation, otherwise normal EKG unchanged from prior.  This was personally reviewed.  Recent Labs: 09/12/2021: ALT 15 05/21/2022: BUN 13; Creatinine, Ser 1.02; Potassium 4.5; Sodium 142  Recent Lipid Panel    Component Value Date/Time   CHOL 128 03/12/2022 1506   TRIG 44 03/12/2022 1506   HDL 62 03/12/2022 1506   CHOLHDL 2.1 03/12/2022 1506   CHOLHDL 2 09/12/2021 1016   VLDL 9.0 09/12/2021 1016   LDLCALC 55 03/12/2022 1506   LDLDIRECT 169.4 07/09/2012 1021     Risk Assessment/Calculations:           Physical Exam:    VS:  There were no vitals taken for this visit.    Wt Readings from Last 3 Encounters:  04/17/22 215 lb 6 oz (97.7 kg)  03/12/22 214 lb 6.4 oz (97.3 kg)  09/12/21 209 lb (94.8 kg)     GEN: Well nourished, well developed in no acute distress HEENT: Normal NECK: No JVD; No carotid bruits LYMPHATICS: No lymphadenopathy CARDIAC: RRR, no murmurs, rubs, gallops RESPIRATORY:  Clear to auscultation without rales, wheezing or rhonchi  ABDOMEN: Soft, non-tender, non-distended MUSCULOSKELETAL:  No edema; No deformity  SKIN: Warm and dry NEUROLOGIC:  Alert and oriented x 3 PSYCHIATRIC:  Normal affect   ASSESSMENT:    No diagnosis found.  PLAN:    In order of problems listed above:  #CAD status post DES to the circumflex marginal with residual RCA disease 12/2016: LVEF improved from 40% to 55 to 60%. Currently doing well without anginal symptoms. -Continue metop 25mg  XL daily -Continue ASA 81mg  daily -Continue lipitor 80mg  daily, zetia 10mg  daily -Nitro prn  #Heart Failure with Improved EF: #Ischemic cardiomyopathy with improved EF (40% > 55 to 60%): Doing well and compensated on exam. NYHA class I symptoms. -Continue metop 25mg  XL daily -Declined losartan, spiro and jardiance at this time - Low Na diet - Not on diuretics   #Hyperlipidemia: -Continue lipitor 80mg  daily, zetia 10mg  daily  -Check lipids today -Goal LDL<70  #Ascending aortic  aneurysm: Ascending aorta measured 27mm which is stable. -Continue annul monitoring with repeat CTA 05/2022 -Lipitor and zetia as above -HTN management -Avoid heavy lifting     Medication Adjustments/Labs and Tests Ordered: Current medicines are reviewed at length with the patient today.  Concerns regarding medicines are outlined above.  No orders of the defined types were placed in this encounter.  No orders of the defined types were placed in this encounter.   There are no Patient Instructions on file for this visit.    I,Mykaella Javier,acting as a scribe for Meriam Sprague, MD.,have documented all relevant documentation on the behalf of Meriam Sprague, MD,as directed by  Meriam Sprague, MD while in the presence of Meriam Sprague, MD.  I, Meriam Sprague, MD, have reviewed all documentation for this visit. The documentation on 08/19/22 for the exam, diagnosis, procedures, and orders are all accurate and complete.   Signed, Meriam Sprague, MD  08/19/2022 8:14 PM    [facility]

## 2022-08-21 ENCOUNTER — Encounter: Payer: Self-pay | Admitting: Cardiology

## 2022-08-21 ENCOUNTER — Ambulatory Visit: Payer: Medicare Other | Attending: Cardiology | Admitting: Cardiology

## 2022-08-21 VITALS — BP 134/90 | HR 89 | Ht 74.0 in | Wt 215.0 lb

## 2022-08-21 DIAGNOSIS — E782 Mixed hyperlipidemia: Secondary | ICD-10-CM | POA: Insufficient documentation

## 2022-08-21 DIAGNOSIS — I7121 Aneurysm of the ascending aorta, without rupture: Secondary | ICD-10-CM | POA: Diagnosis not present

## 2022-08-21 DIAGNOSIS — I5022 Chronic systolic (congestive) heart failure: Secondary | ICD-10-CM | POA: Insufficient documentation

## 2022-08-21 DIAGNOSIS — I2510 Atherosclerotic heart disease of native coronary artery without angina pectoris: Secondary | ICD-10-CM | POA: Diagnosis not present

## 2022-08-21 DIAGNOSIS — I10 Essential (primary) hypertension: Secondary | ICD-10-CM | POA: Diagnosis not present

## 2022-08-21 NOTE — Patient Instructions (Signed)
Medication Instructions:   Your physician recommends that you continue on your current medications as directed. Please refer to the Current Medication list given to you today.  *If you need a refill on your cardiac medications before your next appointment, please call your pharmacy*   Follow-Up: At [facility], you and your health needs are our priority.  As part of our continuing mission to provide you with exceptional heart care, we have created designated Provider Care Teams.  These Care Teams include your primary Cardiologist (physician) and Advanced Practice Providers (APPs -  Physician Assistants and Nurse Practitioners) who all work together to provide you with the care you need, when you need it.  We recommend signing up for the patient portal called "MyChart".  Sign up information is provided on this After Visit Summary.  MyChart is used to connect with patients for Virtual Visits (Telemedicine).  Patients are able to view lab/test results, encounter notes, upcoming appointments, etc.  Non-urgent messages can be sent to your provider as well.   To learn more about what you can do with MyChart, go to [email or web address].    Your next appointment:   6 month(s)  The format for your next appointment:   In Person  Provider:   DR. PEMBERTON  Important Information About Sugar       

## 2022-08-21 NOTE — Progress Notes (Signed)
Cardiology Office Note:    Date:  08/21/2022   ID:  Randy Pittman, DOB October 05, 1951, MRN [id number]  PCP:  Joaquim Nam, MD   [facility] Providers Cardiologist:  Tobias Alexander, MD {  Referring MD: Joaquim Nam, MD    History of Present Illness:    Randy Pittman is a 71 y.o. male with a hx of CAD, ascending aortic dilation, HLD, PVCs and aortic atherosclerosis who was previously followed by Dr. Delton See who now presents to clinic for follow-up.  Per review of the record, the patient has history of abnormal nuclear stress test 12/27/15 showed moderate appearing inferior wall ischemia from the apex to the base and diffuse hypokinesis is worse in the apex EF 43%.  Left heart cath showed significant 2 vessel CAD with subtotal 99% OM1 and 65% mid RCA and total occlusion of the PLA with collaterals he had DES to the circumflex marginal and RCA may need stenting in the future if it progresses.  He had mild LV dysfunction ejection fraction 50%.    Was last seen in 02/2022 where he was doing well from a CV standpoint.   Today, he is with a family member. His blood pressure has been somewhat elevated in the office. It was 134/100 in office today. At work his blood pressure has been in a lower range closer to 120s/80s. He is experiencing some end of the day LE edema in his ankles. It resolves by the time he gets up on the morning.   His family member reports that in the evening around 4-5, he gets red in his face. She describes it as turning "blood red". Usually he is sitting or just watching TV at these times. Sometimes it resolves quickly and other times it will stay that way for some time. No other associated symptoms. Does not know BP at that time. Does not correlate with a certain medication or food.  He is active through his work and feels fine during exertion.   He denies any palpitations, chest pain, shortness of breath. No lightheadedness, headaches, syncope, orthopnea, or PND.    Past Medical History:  Diagnosis Date   Aortic atherosclerosis (HCC)    chest CT 05/2021   Ascending aorta dilatation (HCC)    35mm on Chest CTA 05/2021   CAD (coronary artery disease)    Hyperlipidemia 07/1995   Low testosterone    per Dr. Reuel Boom, with urology    Past Surgical History:  Procedure Laterality Date   CARDIAC CATHETERIZATION N/A 01/01/2017   Procedure: Left Heart Cath and Coronary Angiography;  Surgeon: Lennette Bihari, MD;  Location: [facility] INVASIVE CV LAB;  Service: Cardiovascular;  Laterality: N/A;   CARDIAC CATHETERIZATION N/A 01/01/2017   Procedure: Coronary Stent Intervention;  Surgeon: Lennette Bihari, MD;  Location: [facility] INVASIVE CV LAB;  Service: Cardiovascular;  Laterality: N/A;   COLONOSCOPY     CORONARY ANGIOPLASTY WITH STENT PLACEMENT  01/01/2017    Current Medications: Current Meds  Medication Sig   aspirin 81 MG tablet Take 81 mg by mouth daily.   atorvastatin (LIPITOR) 80 MG tablet TAKE 1 TABLET DAILY AT 6 P.M.   cholecalciferol (VITAMIN D) 1000 UNITS tablet Take 1,000 Units by mouth daily.   ezetimibe (ZETIA) 10 MG tablet Take 1 tablet (10 mg total) by mouth daily.   metoprolol succinate (TOPROL XL) 25 MG 24 hr tablet Take 1 tablet (25 mg total) by mouth daily.   nitroGLYCERIN (NITROSTAT) 0.4 MG SL tablet Place  1 tablet (0.4 mg total) under the tongue every 5 (five) minutes as needed for chest pain.   vitamin B-12 (CYANOCOBALAMIN) 500 MCG tablet Take 1,000 mcg by mouth daily.     Allergies:   Patient has no known allergies.   Social History   Socioeconomic History   Marital status: Married    Spouse name: Not on file   Number of children: Not on file   Years of education: Not on file   Highest education level: Not on file  Occupational History   Occupation: Scientific laboratory technician [employer]  Tobacco Use   Smoking status: Former    Packs/day: 2.00    Years: 5.00    Total pack years: 10.00    Types: Cigarettes    Quit date: 1980    Years since quitting: 43.6    Smokeless tobacco: Former    Types: Chew    Quit date: 1975  Vaping Use   Vaping Use: Never used  Substance and Sexual Activity   Alcohol use: Yes    Comment: 1 a day or less.     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, E6   Social Determinants of Health   Financial Resource Strain: Low Risk  (08/28/2020)   Overall Financial Resource Strain (CARDIA)    Difficulty of Paying Living Expenses: Not hard at all  Food Insecurity: No Food Insecurity (08/28/2020)   Hunger Vital Sign    Worried About Running Out of Food in the Last Year: Never true    Ran Out of Food in the Last Year: Never true  Transportation Needs: No Transportation Needs (08/28/2020)   PRAPARE - Administrator, Civil Service (Medical): No    Lack of Transportation (Non-Medical): No  Physical Activity: Sufficiently Active (08/28/2020)   Exercise Vital Sign    Days of Exercise per Week: 7 days    Minutes of Exercise per Session: 60 min  Stress: No Stress Concern Present (08/28/2020)   [employer] of Occupational Health - Occupational Stress Questionnaire    Feeling of Stress : Not at all  Social Connections: Not on file     Family History: The patient's family history includes Cirrhosis in his mother; Colon cancer in an other family member; Colon polyps in his brother; Diabetes in his mother; Heart disease in his brother; Hypertension in his mother; Parkinsonism in his father; Prostate cancer in an other family member.  ROS:   Please see the history of present illness.    Review of Systems  Constitutional:  Negative for malaise/fatigue and weight loss.  HENT:  Negative for congestion and sore throat.   Eyes:  Negative for blurred vision.  Respiratory:  Negative for cough and shortness of breath.    Cardiovascular:  Positive for leg swelling (Bilateral at the end of the day). Negative for chest pain, palpitations, orthopnea, claudication and PND.  Gastrointestinal:  Negative for heartburn and nausea.  Genitourinary:  Negative for dysuria and urgency.  Musculoskeletal:  Negative for joint pain and myalgias.  Skin:  Negative for itching and rash.       Flushing on his face in the evening  Neurological:  Negative for dizziness.  Endo/Heme/Allergies:  Does not bruise/bleed easily.  Psychiatric/Behavioral:  The  patient is not nervous/anxious and does not have insomnia.    All other systems reviewed and are negative.  EKGs/Labs/Other Studies Reviewed:    The following studies were reviewed today: CTA Aorta 05/2022: IMPRESSION: 1. Stable fusiform dilatation of the ascending aorta, maximal dimension 4.3 cm. Recommend annual imaging followup by CTA or MRA. This recommendation follows 2010 ACCF/AHA/AATS/ACR/ASA/SCA/SCAI/SIR/STS/SVM Guidelines for the Diagnosis and Management of Patients with Thoracic Aortic Disease. Circulation. 2010; 121: W098-J191. Aortic aneurysm NOS (ICD10-I71.9) 2. Coronary artery calcifications.   Aortic Atherosclerosis (ICD10-I70.0).  CTA Chest 06/19/21 IMPRESSION: Vascular: 1. Unchanged fusiform ascending thoracic aortic aneurysm measuring up to 43 mm. Recommend annual imaging followup by CTA or MRA. This recommendation follows 2010 ACCF/AHA/AATS/ACR/ASA/SCA/SCAI/SIR/STS/SVM Guidelines for the Diagnosis and Management of Patients with Thoracic Aortic Disease. Circulation. 2010; 121: Y782-N562. Aortic aneurysm NOS (ICD10-I71.9) 2. Coronary and aortic atherosclerosis (ICD10-I70.0).   Non-Vascular: No acute intrathoracic abnormality.  Echo 06/02/18 - Left ventricle: The cavity size was normal. There was moderate concentric hypertrophy. Systolic function was normal. The estimated ejection fraction was in the range of 55% to 60%. Wall motion was normal; there  were no regional wall motion abnormalities. Doppler parameters are consistent with abnormal left ventricular relaxation (grade 1 diastolic dysfunction). There was no evidence of elevated ventricular filling pressure by Doppler parameters.  - Aortic valve: There was mild regurgitation.  - Aortic root: The aortic root was dilated measuring 41 mm.  - Ascending aorta: The atrium was mildly dilated measuring 44 mm.  - Mitral valve: There was no regurgitation.  - Left atrium: The atrium was mildly dilated.  - Right ventricle: The cavity size was normal. Wall thickness was normal. Systolic function was normal.  - Right atrium: The atrium was normal in size.  - Tricuspid valve: There was no regurgitation.  - Pulmonic valve: There was no regurgitation.  - Pulmonary arteries: Systolic pressure could not be accurately estimated.  - Inferior vena cava: The vessel was normal in size.  - Pericardium, extracardiac: There was no pericardial effusion.  Impressions:  - Since the prior study on 05/01/2017 ascending aorta has further dilated from 42 mm to 44 mm. A dedicated CTA or MRA is recommended for further evaluation.   Lexiscan Myoview 11/17/17 Nuclear stress EF: 46%. Blood pressure demonstrated a normal response to exercise. No T wave inversion was noted during stress. There was no ST segment deviation noted during stress. Defect 1: There is a large defect of moderate severity. This is an intermediate risk study.   Large size, moderate severity fixed inferior perfusion defect which is most likely scar, no reversible ischemia. LVEF 46% with inferior hypokinesis. This is an intermediate risk study.  Echo 05/01/17 - Left ventricle: The cavity size was normal. Wall thickness was   increased in a pattern of mild LVH. There was moderate concentric   hypertrophy. Systolic function was normal. The estimated ejection   fraction was in the range of 50% to 55%. Diffuse hypokinesis.   Doppler parameters are  consistent with abnormal left ventricular   relaxation (grade 1 diastolic dysfunction).  - Aortic valve: There was mild regurgitation.  - Aorta: Ascending aortic diameter: 42 mm (S).  - Ascending aorta: The ascending aorta was mildly dilated.  - Mitral valve: Calcified annulus. Mildly thickened, mildly    calcified leaflets .  - Left atrium: The atrium was mildly dilated.   Cardiac catheterization 01/01/17 Procedures    Coronary Stent Intervention  Left Heart Cath and Coronary Angiography  Conclusion  There is mild left ventricular systolic dysfunction. Dist RCA lesion, 100 %stenosed. Mid RCA lesion, 65 %stenosed. A STENT SYNERGY DES [id number] drug eluting stent was successfully placed. Lat 1st Mrg lesion, 99 %stenosed. Post intervention, there is a 0% residual stenosis.   Low-normal to mild LV dysfunction with an ejection fraction of 50% with a small focal region of distal inferior hypocontractility.   Significant 2 vessel coronary obstructive disease with a subtotal 99 100% stenosis involving the inferior branch of the obtuse marginal 1 vessel; and 65% mid RCA stenosis proximal to the acute margin with total occlusion of the PLA with retrograde collateralization to the distal PLA via the AV groove circumflex branches.  LAD with mild luminal irregularity.   Successful PCI to the circumflex marginal vessel with ultimate insertion of a Synergy DES 2.528 mm stent postdilated 2.75 mm with the 99% stenosis reduced to 0% and resumption of brisk TIMI-3 flow    2-D echo 12/26/16   Study Conclusions   - Left ventricle: The cavity size was normal. Wall thickness was increased in a pattern of mild LVH. Systolic function was mildly to moderately reduced. The estimated ejection fraction was in the range of 40% to 45%. Moderate hypokinesis of the mid-apicalinferoseptal myocardium. Doppler parameters are consistent with abnormal left ventricular relaxation (grade 1 diastolic dysfunction). - Aortic  valve: There was trivial regurgitation. - Right atrium: The atrium was mildly dilated.   TTE: 05/01/2017 - Left ventricle: The cavity size was normal. Wall thickness was increased in a pattern of mild LVH. There was moderate concentric hypertrophy. Systolic function was normal. The estimated ejection fraction was in the range of 50% to 55%. Diffuse hypokinesis. Doppler parameters are consistent with abnormal left ventricular relaxation (grade 1 diastolic dysfunction). - Aortic valve: There was mild regurgitation. - Aorta: Ascending aortic diameter: 42 mm (S). - Ascending aorta: The ascending aorta was mildly dilated. - Mitral valve: Calcified annulus. Mildly thickened, mildly   calcified leaflets . - Left atrium: The atrium was mildly dilated.   Nuclear stress test 12/26/16 Study Highlights    Nuclear stress EF: 43%. Findings consistent with ischemia. This is an intermediate risk study. The left ventricular ejection fraction is moderately decreased (30-44%). Upsloping ST segment depression ST segment depression of 1 mm was noted during stress in the I, aVL, V5 and V6 leads.   Moderate appearing inferior wall ischemia from apex to base Diffuse hypokinesis worse in apex EF 43%       EKG:  EKG is personally reviewed. 08/21/2022:  EKG was not ordered. 03/12/22: NSR, rate 82 bpm; occasional PVCs EKG performed June 25, 2018 shows normal sinus rhythm left axis deviation, otherwise normal EKG unchanged from prior.  This was personally reviewed.  Recent Labs: 09/12/2021: ALT 15 05/21/2022: BUN 13; Creatinine, Ser 1.02; Potassium 4.5; Sodium 142  Recent Lipid Panel    Component Value Date/Time   CHOL 128 03/12/2022 1506   TRIG 44 03/12/2022 1506   HDL 62 03/12/2022 1506   CHOLHDL 2.1 03/12/2022 1506   CHOLHDL 2 09/12/2021 1016   VLDL 9.0 09/12/2021 1016   LDLCALC 55 03/12/2022 1506   LDLDIRECT 169.4 07/09/2012 1021     Risk Assessment/Calculations:           Physical Exam:    VS:   BP (!) 134/90 (BP Location: Left Arm, Patient Position: Sitting, Cuff Size: Normal)   Pulse 89   Ht 6\' 2"  (1.88 m)   Wt 215 lb (97.5 kg)   SpO2 98%  BMI 27.60 kg/m     Wt Readings from Last 3 Encounters:  08/21/22 215 lb (97.5 kg)  04/17/22 215 lb 6 oz (97.7 kg)  03/12/22 214 lb 6.4 oz (97.3 kg)     GEN: Well nourished, well developed in no acute distress HEENT: Normal NECK: No JVD; No carotid bruits CARDIAC: RRR, 1/6 systolic murmur RESPIRATORY:  Clear to auscultation without rales, wheezing or rhonchi  ABDOMEN: Soft, non-tender, non-distended MUSCULOSKELETAL:  No edema; No deformity  SKIN: Warm and dry NEUROLOGIC:  Alert and oriented x 3 PSYCHIATRIC:  Normal affect   ASSESSMENT:    1. Coronary artery disease involving native coronary artery of native heart without angina pectoris   2. Ascending aortic aneurysm, unspecified whether ruptured (HCC)   3. Mixed hyperlipidemia   4. Primary hypertension   5. Chronic systolic heart failure (HCC)     PLAN:    In order of problems listed above:  #CAD status post DES to the circumflex marginal with residual RCA disease 12/2016: LVEF improved from 40% to 55 to 60%. Currently doing well without anginal symptoms. -Continue metop 25mg  XL daily -Continue ASA 81mg  daily -Continue lipitor 80mg  daily, zetia 10mg  daily -Nitro prn  #Chronic Systolic Heart Failure with Improved EF: #Ischemic cardiomyopathy with improved EF (40% > 55 to 60%): Doing well and compensated on exam. NYHA class I symptoms. -Plan to repeat TTE for monitoring at next visit in 9mo -Continue metop 25mg  XL daily -Declined losartan, spiro and jardiance at this time -Low Na diet -Not on diuretics   #Hyperlipidemia: -Continue lipitor 80mg  daily, zetia 10mg  daily  -Goal LDL<70; current LDL 55 in  #Ascending aortic aneurysm: Ascending aorta measured 85mm which is stable. -Continue annul monitoring with repeat CTA 05/2023 -Lipitor and zetia as  above -HTN management -Avoid heavy lifting    Follow Up: 6 months  Medication Adjustments/Labs and Tests Ordered: Current medicines are reviewed at length with the patient today.  Concerns regarding medicines are outlined above.  No orders of the defined types were placed in this encounter.  No orders of the defined types were placed in this encounter.  Patient Instructions  Medication Instructions:   Your physician recommends that you continue on your current medications as directed. Please refer to the Current Medication list given to you today.  *If you need a refill on your cardiac medications before your next appointment, please call your pharmacy*    Follow-Up: At [facility], you and your health needs are our priority.  As part of our continuing mission to provide you with exceptional heart care, we have created designated Provider Care Teams.  These Care Teams include your primary Cardiologist (physician) and Advanced Practice Providers (APPs -  Physician Assistants and Nurse Practitioners) who all work together to provide you with the care you need, when you need it.  We recommend signing up for the patient portal called "MyChart".  Sign up information is provided on this After Visit Summary.  MyChart is used to connect with patients for Virtual Visits (Telemedicine).  Patients are able to view lab/test results, encounter notes, upcoming appointments, etc.  Non-urgent messages can be sent to your provider as well.   To learn more about what you can do with MyChart, go to .    Your next appointment:   6 month(s)  The format for your next appointment:   In Person  Provider:   DR.  Important Information About Sugar  I,Jessica Ford,acting as a Neurosurgeon for Meriam Sprague, MD.,have documented all relevant documentation on the behalf of Meriam Sprague, MD,as directed by  Meriam Sprague, MD while in the  presence of Meriam Sprague, MD.   I, Meriam Sprague, MD, have reviewed all documentation for this visit. The documentation on 08/21/22 for the exam, diagnosis, procedures, and orders are all accurate and complete.   Signed, Meriam Sprague, MD  08/21/2022 3:13 PM    [facility]

## 2022-08-31 ENCOUNTER — Encounter: Payer: Self-pay | Admitting: Cardiology

## 2022-09-03 ENCOUNTER — Ambulatory Visit: Payer: Medicare Other

## 2022-09-10 ENCOUNTER — Other Ambulatory Visit: Payer: Self-pay | Admitting: Family Medicine

## 2022-09-10 ENCOUNTER — Other Ambulatory Visit (INDEPENDENT_AMBULATORY_CARE_PROVIDER_SITE_OTHER): Payer: Medicare Other

## 2022-09-10 DIAGNOSIS — Z125 Encounter for screening for malignant neoplasm of prostate: Secondary | ICD-10-CM

## 2022-09-10 DIAGNOSIS — E782 Mixed hyperlipidemia: Secondary | ICD-10-CM | POA: Diagnosis not present

## 2022-09-10 LAB — COMPREHENSIVE METABOLIC PANEL
ALT: 17 U/L (ref 0–53)
AST: 17 U/L (ref 0–37)
Albumin: 4.4 g/dL (ref 3.5–5.2)
Alkaline Phosphatase: 43 U/L (ref 39–117)
BUN: 22 mg/dL (ref 6–23)
CO2: 29 mEq/L (ref 19–32)
Calcium: 9.4 mg/dL (ref 8.4–10.5)
Chloride: 103 mEq/L (ref 96–112)
Creatinine, Ser: 1.12 mg/dL (ref 0.40–1.50)
GFR: 67.06 mL/min (ref >60.00)
Glucose, Bld: 91 mg/dL (ref 70–99)
Potassium: 4.2 mEq/L (ref 3.5–5.1)
Sodium: 141 mEq/L (ref 135–145)
Total Bilirubin: 0.6 mg/dL (ref 0.2–1.2)
Total Protein: 6.6 g/dL (ref 6.0–8.3)

## 2022-09-10 LAB — PSA, MEDICARE: PSA: 2.67 ng/ml (ref 0.10–4.00)

## 2022-09-10 LAB — LIPID PANEL
Cholesterol: 127 mg/dL (ref 0–200)
HDL: 59.6 mg/dL (ref >39.00)
LDL Cholesterol: 59 mg/dL (ref 0–99)
NonHDL: 67.14
Total CHOL/HDL Ratio: 2
Triglycerides: 43 mg/dL (ref 0.0–149.0)
VLDL: 8.6 mg/dL (ref 0.0–40.0)

## 2022-09-16 ENCOUNTER — Ambulatory Visit (INDEPENDENT_AMBULATORY_CARE_PROVIDER_SITE_OTHER): Payer: Medicare Other | Admitting: Family Medicine

## 2022-09-16 ENCOUNTER — Encounter: Payer: Self-pay | Admitting: Family Medicine

## 2022-09-16 VITALS — BP 118/80 | HR 73 | Temp 97.9°F | Ht 74.0 in | Wt 216.0 lb

## 2022-09-16 DIAGNOSIS — I77810 Thoracic aortic ectasia: Secondary | ICD-10-CM

## 2022-09-16 DIAGNOSIS — Z23 Encounter for immunization: Secondary | ICD-10-CM

## 2022-09-16 DIAGNOSIS — Z0000 Encounter for general adult medical examination without abnormal findings: Secondary | ICD-10-CM | POA: Diagnosis not present

## 2022-09-16 DIAGNOSIS — E782 Mixed hyperlipidemia: Secondary | ICD-10-CM | POA: Diagnosis not present

## 2022-09-16 DIAGNOSIS — I2510 Atherosclerotic heart disease of native coronary artery without angina pectoris: Secondary | ICD-10-CM

## 2022-09-16 DIAGNOSIS — Z1211 Encounter for screening for malignant neoplasm of colon: Secondary | ICD-10-CM

## 2022-09-16 DIAGNOSIS — Z7189 Other specified counseling: Secondary | ICD-10-CM

## 2022-09-16 MED ORDER — EZETIMIBE 10 MG PO TABS: 10.0000 mg | ORAL_TABLET | Freq: Every day | ORAL | 3 refills | Status: DC

## 2022-09-16 MED ORDER — METOPROLOL SUCCINATE ER 25 MG PO TB24: 25.0000 mg | ORAL_TABLET | Freq: Every day | ORAL | 3 refills | Status: DC

## 2022-09-16 MED ORDER — ATORVASTATIN CALCIUM 80 MG PO TABS: ORAL_TABLET | ORAL | 3 refills | Status: DC

## 2022-09-16 NOTE — Progress Notes (Unsigned)
I have personally reviewed the Medicare Annual Wellness questionnaire and have noted 1. The patient's medical and social history 2. Their use of alcohol, tobacco or illicit drugs 3. Their current medications and supplements 4. The patient's functional ability including ADL's, fall risks, home safety risks and hearing or visual             impairment. 5. Diet and physical activities 6. Evidence for depression or mood disorders  The patients weight, height, BMI have been recorded in the chart and visual acuity is per eye clinic.  I have made referrals, counseling and provided education to the patient based review of the above and I have provided the pt with a written personalized care plan for preventive services.  Provider list updated- see scanned forms.  Routine anticipatory guidance given to patient.  See health maintenance. The possibility exists that previously documented standard health maintenance information may have been brought forward from a previous encounter into this note.  If needed, that same information has been updated to reflect the current situation based on today's encounter.    Flu Shingles PNA Tetanus Colon  Breast cancer screening Prostate cancer screening Advance directive Cognitive function addressed- see scanned forms- and if abnormal then additional documentation follows.   In addition to [facility] Wellness, follow up visit for the below conditions:  Elevated Cholesterol: Using medications without problems: yes Muscle aches: no Diet compliance: yes Exercise: yes  CAD.  S/p stend 2018.  No CP, SOB, BLE edema.   Aorta f/u per cardiology, I'll defer and he agrees  PMH and SH reviewed  Meds, vitals, and allergies reviewed.   ROS: Per HPI.  Unless specifically indicated otherwise in HPI, the patient denies:  General: fever. Eyes: acute vision changes ENT: sore throat Cardiovascular: chest pain Respiratory: SOB GI: vomiting GU:  dysuria Musculoskeletal: acute back pain Derm: acute rash Neuro: acute motor dysfunction Psych: worsening mood Endocrine: polydipsia Heme: bleeding Allergy: hayfever  GEN: nad, alert and oriented HEENT: ncat NECK: supple w/o LA CV: rrr. PULM: ctab, no inc wob ABD: soft, +bs EXT: no edema SKIN: no acute rash

## 2022-09-16 NOTE — Patient Instructions (Addendum)
Recheck yearly.   Update me as needed.  Take care.  Glad to see you. We'll get cologuard sent to you.

## 2022-09-17 ENCOUNTER — Telehealth: Payer: Self-pay | Admitting: Family Medicine

## 2022-09-17 NOTE — Assessment & Plan Note (Signed)
Continue atorvastatin and Zetia. 

## 2022-09-17 NOTE — Assessment & Plan Note (Signed)
Flu 2023 Shingles previously done PNA previously done Tetanus 2020 COVID-vaccine previously done D/w patient [id number] for colon cancer screening, including IFOB vs. colonoscopy.  Risks and benefits of both were discussed and patient voiced understanding.  Pt elects for: Cologuard Prostate cancer screening 2023 Advance directive-wife designated if patient were incapacitated. Cognitive function addressed- see scanned forms- and if abnormal then additional documentation follows.

## 2022-09-17 NOTE — Assessment & Plan Note (Signed)
Advance directive- wife designated if patient were incapacitated.  

## 2022-09-17 NOTE — Assessment & Plan Note (Signed)
Ascending aorta f/u per cardiology, I'll defer and he agrees.

## 2022-09-17 NOTE — Telephone Encounter (Signed)
Please check with patient.  I realize that cardiology is following of his ascending aorta.  There is a separate issue about getting ultrasound done to check his abdominal aorta to make sure he does not have an aneurysm there.  This is reasonable to get done and he already has an order in the EMR.  Please check with patient about seeing if he wants to get this done.  I think it makes sense to get done.  Please let me know.  Thanks.

## 2022-09-17 NOTE — Assessment & Plan Note (Signed)
Labs discussed with patient.  No change in meds. Continue as is: Current Outpatient Medications on File Prior to Visit  Medication Sig Dispense Refill  . aspirin 81 MG tablet Take 81 mg by mouth daily.    . cholecalciferol (VITAMIN D) 1000 UNITS tablet Take 1,000 Units by mouth daily.    . nitroGLYCERIN (NITROSTAT) 0.4 MG SL tablet Place 1 tablet (0.4 mg total) under the tongue every 5 (five) minutes as needed for chest pain. 25 tablet 3  . vitamin B-12 (CYANOCOBALAMIN) 500 MCG tablet Take 1,000 mcg by mouth daily.     No current facility-administered medications on file prior to visit.

## 2022-09-19 NOTE — Telephone Encounter (Signed)
Patient returned call

## 2022-09-19 NOTE — Telephone Encounter (Signed)
Noted. Thanks.

## 2022-09-19 NOTE — Telephone Encounter (Signed)
LMTCB

## 2022-09-19 NOTE — Telephone Encounter (Signed)
Patient called back and spoke with him about getting US done. Patient states he wants to think about this and will call back if he wants to do this.

## 2022-09-22 DIAGNOSIS — Z1211 Encounter for screening for malignant neoplasm of colon: Secondary | ICD-10-CM | POA: Diagnosis not present

## 2022-09-27 LAB — COLOGUARD: COLOGUARD: NEGATIVE

## 2023-02-28 NOTE — Progress Notes (Unsigned)
Cardiology Office Note:    Date:  02/28/2023   ID:  Randy Pittman, DOB 1951-12-05, MRN [id number]  PCP:  [name], MD   [facility] Providers Cardiologist:  Ena Dawley, MD {  Referring MD: [name], MD    History of Present Illness:    Randy Pittman is a 72 y.o. male with a hx of CAD, ascending aortic dilation, HLD, PVCs and aortic atherosclerosis who was previously followed by Dr. [name] who now presents to clinic for follow-up.  Per review of the record, the patient has history of abnormal nuclear stress test 12/27/15 showed moderate appearing inferior wall ischemia from the apex to the base and diffuse hypokinesis is worse in the apex EF 43%.  Left heart cath showed significant 2 vessel CAD with subtotal 99% OM1 and 65% mid RCA and total occlusion of the PLA with collaterals he had DES to the circumflex marginal and RCA may need stenting in the future if it progresses.  He had mild LV dysfunction ejection fraction 50%.    Was last seen in 07/2022 where he was stable from a CV standpoint.  Today, ***   Past Medical History:  Diagnosis Date   Aortic atherosclerosis ([place])    chest CT 05/2021   Ascending aorta dilatation (HCC)    27m on Chest CTA 05/2021   CAD (coronary artery disease)    Hyperlipidemia 07/1995   Low testosterone    per Dr. DQuillian Quince with urology    Past Surgical History:  Procedure Laterality Date   CARDIAC CATHETERIZATION N/A 01/01/2017   Procedure: Left Heart Cath and Coronary Angiography;  Surgeon: TTroy Sine MD;  [facility];  Service: Cardiovascular;  Laterality: N/A;   CARDIAC CATHETERIZATION N/A 01/01/2017   Procedure: Coronary Stent Intervention;  Surgeon: TTroy Sine MD;  [facility];  Service: Cardiovascular;  Laterality: N/A;   COLONOSCOPY     CORONARY ANGIOPLASTY WITH STENT PLACEMENT  01/01/2017    Current Medications: No outpatient medications have been marked as taking for the  03/02/23 encounter (Appointment) with PFreada Bergeron MD.     Allergies:   Patient has no known allergies.   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, E6   Social Determinants of Health   Financial Resource Strain: LCampbell (08/28/2020)   Overall Financial Resource Strain (CEsmeralda  Difficulty of Paying Living Expenses: Not hard at all  Food Insecurity: No Food Insecurity (08/28/2020)   Hunger Vital Sign    Worried About Running Out of Food in the Last Year: Never true    Ran Out of Food in the Last Year: Never true  Transportation Needs: No Transportation Needs (08/28/2020)   PRAPARE - Hydrologist (Medical): No    Lack of Transportation (Non-Medical): No  Physical Activity: Sufficiently Active (08/28/2020)   Exercise Vital Sign    Days of Exercise per Week: 7 days    Minutes of Exercise per Session: 60 min  Stress: No Stress Concern Present (08/28/2020)   [place]    Feeling of Stress : Not at all  Social Connections: Not on file     Family History: The patient's family history  includes Cirrhosis in his mother; Colon cancer in an other family member; Colon polyps in his brother; Diabetes in his mother; Heart disease in his brother; Hypertension in his mother; Parkinsonism in his father; Prostate cancer in an other family member.  ROS:   Please see the history of present illness.    Review of Systems  Constitutional:  Negative for malaise/fatigue and weight loss.  HENT:  Negative for congestion and sore throat.   Eyes:  Negative for blurred vision.  Respiratory:  Negative for cough and shortness of breath.   Cardiovascular:  Positive for leg swelling (Bilateral at the end of the day). Negative for chest pain, palpitations, orthopnea, claudication and PND.  Gastrointestinal:  Negative for heartburn and nausea.  Genitourinary:  Negative for dysuria and urgency.  Musculoskeletal:  Negative for joint pain and myalgias.  Skin:  Negative for itching and rash.       Flushing on his face in the evening  Neurological:  Negative for dizziness.  Endo/Heme/Allergies:  Does not bruise/bleed easily.  Psychiatric/Behavioral:  The patient is not nervous/anxious and does not have insomnia.    All other systems reviewed and are negative.  EKGs/Labs/Other Studies Reviewed:    The following studies were reviewed today: CTA Aorta 05/2022: IMPRESSION: 1. Stable fusiform dilatation of the ascending aorta, maximal dimension 4.3 cm. Recommend annual imaging followup by CTA or MRA. This recommendation follows 2010 ACCF/AHA/AATS/ACR/ASA/SCA/SCAI/SIR/STS/SVM Guidelines for the Diagnosis and Management of Patients with Thoracic Aortic Disease. Circulation. 2010; 121ML:4928372. Aortic aneurysm NOS (ICD10-I71.9) 2. Coronary artery calcifications.   Aortic Atherosclerosis (ICD10-I70.0).  CTA Chest 06/19/21 IMPRESSION: Vascular: 1. Unchanged fusiform ascending thoracic aortic aneurysm measuring up to 43 mm. Recommend annual imaging followup by CTA or MRA. This recommendation follows  2010 ACCF/AHA/AATS/ACR/ASA/SCA/SCAI/SIR/STS/SVM Guidelines for the Diagnosis and Management of Patients with Thoracic Aortic Disease. Circulation. 2010; [id number]. Aortic aneurysm NOS (ICD10-I71.9) 2. Coronary and aortic atherosclerosis (ICD10-I70.0).   Non-Vascular: No acute intrathoracic abnormality.  Echo 06/02/18 - Left ventricle: The cavity size was normal. There was moderate concentric hypertrophy. Systolic function was normal. The estimated ejection fraction was in the range of 55% to 60%. Wall motion was normal; there were no regional wall motion abnormalities. Doppler parameters are consistent with abnormal left ventricular relaxation (grade 1 diastolic dysfunction). There was no evidence of elevated ventricular filling pressure by Doppler parameters.  - Aortic valve: There was mild regurgitation.  - Aortic root: The aortic root was dilated measuring 41 mm.  - Ascending aorta: The atrium was mildly dilated measuring 44 mm.  - Mitral valve: There was no regurgitation.  - Left  atrium: The atrium was mildly dilated.  - Right ventricle: The cavity size was normal. Wall thickness was normal. Systolic function was normal.  - Right atrium: The atrium was normal in size.  - Tricuspid valve: There was no regurgitation.  - Pulmonic valve: There was no regurgitation.  - Pulmonary arteries: Systolic pressure could not be accurately estimated.  - Inferior vena cava: The vessel was normal in size.  - Pericardium, extracardiac: There was no pericardial effusion.  Impressions:  - Since the prior study on 05/01/2017 ascending aorta has further dilated from 42 mm to 44 mm. A dedicated CTA or MRA is recommended for further evaluation.   Lexiscan Myoview 11/17/17 Nuclear stress EF: 46%. Blood pressure demonstrated a normal response to exercise. No T wave inversion was noted during stress. There was no ST segment deviation noted during stress. Defect 1: There is a large defect of moderate  severity. This is an intermediate risk study.   Large size, moderate severity fixed inferior perfusion defect which is most likely scar, no reversible ischemia. LVEF 46% with inferior hypokinesis. This is an intermediate risk study.  Echo 05/01/17 - Left ventricle: The cavity size was normal. Wall thickness was   increased in a pattern of mild LVH. There was moderate concentric   hypertrophy. Systolic function was normal. The estimated ejection   fraction was in the range of 50% to 55%. Diffuse hypokinesis.   Doppler parameters are consistent with abnormal left ventricular   relaxation (grade 1 diastolic dysfunction).  - Aortic valve: There was mild regurgitation.  - Aorta: Ascending aortic diameter: 42 mm (S).  - Ascending aorta: The ascending aorta was mildly dilated.  - Mitral valve: Calcified annulus. Mildly thickened, mildly    calcified leaflets .  - Left atrium: The atrium was mildly dilated.   Cardiac catheterization 01/01/17 Procedures    Coronary Stent Intervention  Left Heart Cath and Coronary Angiography  Conclusion      There is mild left ventricular systolic dysfunction. Dist RCA lesion, 100 %stenosed. Mid RCA lesion, 65 %stenosed. A STENT SYNERGY DES 2.5X28 drug eluting stent was successfully placed. Lat 1st Mrg lesion, 99 %stenosed. Post intervention, there is a 0% residual stenosis.   Low-normal to mild LV dysfunction with an ejection fraction of 50% with a small focal region of distal inferior hypocontractility.   Significant 2 vessel coronary obstructive disease with a subtotal 99 100% stenosis involving the inferior branch of the obtuse marginal 1 vessel; and 65% mid RCA stenosis proximal to the acute margin with total occlusion of the PLA with retrograde collateralization to the distal PLA via the AV groove circumflex branches.  LAD with mild luminal irregularity.   Successful PCI to the circumflex marginal vessel with ultimate insertion of a Synergy DES 2.528 mm  stent postdilated 2.75 mm with the 99% stenosis reduced to 0% and resumption of brisk TIMI-3 flow    2-D echo 12/26/16   Study Conclusions   - Left ventricle: The cavity size was normal. Wall thickness was increased in a pattern of mild LVH. Systolic function was mildly to moderately reduced. The estimated ejection fraction was in the range of 40% to 45%. Moderate hypokinesis of the mid-apicalinferoseptal myocardium. Doppler parameters are consistent with abnormal left ventricular relaxation (grade 1 diastolic dysfunction). - Aortic valve: There was trivial regurgitation. - Right atrium: The atrium was mildly dilated.   TTE: 05/01/2017 - Left ventricle: The cavity size was normal. Wall thickness was increased in a pattern of mild LVH. There  was moderate concentric hypertrophy. Systolic function was normal. The estimated ejection fraction was in the range of 50% to 55%. Diffuse hypokinesis. Doppler parameters are consistent with abnormal left ventricular relaxation (grade 1 diastolic dysfunction). - Aortic valve: There was mild regurgitation. - Aorta: Ascending aortic diameter: 42 mm (S). - Ascending aorta: The ascending aorta was mildly dilated. - Mitral valve: Calcified annulus. Mildly thickened, mildly   calcified leaflets . - Left atrium: The atrium was mildly dilated.   Nuclear stress test 12/26/16 Study Highlights    Nuclear stress EF: 43%. Findings consistent with ischemia. This is an intermediate risk study. The left ventricular ejection fraction is moderately decreased (30-44%). Upsloping ST segment depression ST segment depression of 1 mm was noted during stress in the I, aVL, V5 and V6 leads.   Moderate appearing inferior wall ischemia from apex to base Diffuse hypokinesis worse in apex EF 43%       EKG:  EKG is personally reviewed. 08/21/2022:  EKG was not ordered. 03/12/22: NSR, rate 82 bpm; occasional PVCs EKG performed June 25, 2018 shows normal sinus rhythm left axis  deviation, otherwise normal EKG unchanged from prior.  This was personally reviewed.  Recent Labs: 09/10/2022: ALT 17; BUN 22; Creatinine, Ser 1.12; Potassium 4.2; Sodium 141  Recent Lipid Panel    Component Value Date/Time   CHOL 127 09/10/2022 0749   CHOL 128 03/12/2022 1506   TRIG 43.0 09/10/2022 0749   HDL 59.60 09/10/2022 0749   HDL 62 03/12/2022 1506   CHOLHDL 2 09/10/2022 0749   VLDL 8.6 09/10/2022 0749   LDLCALC 59 09/10/2022 0749   LDLCALC 55 03/12/2022 1506   LDLDIRECT 169.4 07/09/2012 1021     Risk Assessment/Calculations:           Physical Exam:    VS:  There were no vitals taken for this visit.    Wt Readings from Last 3 Encounters:  09/16/22 216 lb (98 kg)  08/21/22 215 lb (97.5 kg)  04/17/22 215 lb 6 oz (97.7 kg)     GEN: Well nourished, well developed in no acute distress HEENT: Normal NECK: No JVD; No carotid bruits CARDIAC: RRR, 1/6 systolic murmur RESPIRATORY:  Clear to auscultation without rales, wheezing or rhonchi  ABDOMEN: Soft, non-tender, non-distended MUSCULOSKELETAL:  No edema; No deformity  SKIN: Warm and dry NEUROLOGIC:  Alert and oriented x 3 PSYCHIATRIC:  Normal affect   ASSESSMENT:    No diagnosis found.   PLAN:    In order of problems listed above:  #CAD status post DES to the circumflex marginal with residual RCA disease 12/2016: LVEF improved from 40% to 55 to 60%. Currently doing well without anginal symptoms. -Continue metop '25mg'$  XL daily -Continue ASA '81mg'$  daily -Continue lipitor '80mg'$  daily, zetia '10mg'$  daily -Nitro prn  #Chronic Systolic Heart Failure with Improved EF: #Ischemic cardiomyopathy with improved EF (40% > 55 to 60%): Doing well and compensated on exam. NYHA class I symptoms. -Plan to repeat TTE for monitoring at next visit in 20mo-Continue metop '25mg'$  XL daily -Declined losartan, spiro and jardiance at this time -Low Na diet -Not on diuretics   #Hyperlipidemia: -Continue lipitor '80mg'$  daily, zetia  '10mg'$  daily  -Goal LDL<70; current LDL 59 08/2022  #Ascending aortic aneurysm: Ascending aorta measured 466mwhich is stable. -Continue annul monitoring with repeat CTA 05/2023 -Lipitor and zetia as above -HTN management -Avoid heavy lifting    Follow Up: 6 months  Medication Adjustments/Labs and Tests Ordered: Current medicines are reviewed at  length with the patient today.  Concerns regarding medicines are outlined above.  No orders of the defined types were placed in this encounter.  No orders of the defined types were placed in this encounter.  There are no Patient Instructions on file for this visit.    Signed, Freada Bergeron, MD  02/28/2023 12:47 PM    [place]

## 2023-03-02 ENCOUNTER — Telehealth: Payer: Self-pay | Admitting: *Deleted

## 2023-03-02 ENCOUNTER — Encounter: Payer: Self-pay | Admitting: Cardiology

## 2023-03-02 ENCOUNTER — Ambulatory Visit: Payer: Medicare Other | Attending: Cardiology | Admitting: Cardiology

## 2023-03-02 VITALS — BP 140/86 | HR 82 | Ht 74.0 in | Wt 218.8 lb

## 2023-03-02 DIAGNOSIS — I7121 Aneurysm of the ascending aorta, without rupture: Secondary | ICD-10-CM | POA: Insufficient documentation

## 2023-03-02 DIAGNOSIS — I5022 Chronic systolic (congestive) heart failure: Secondary | ICD-10-CM | POA: Diagnosis not present

## 2023-03-02 DIAGNOSIS — I10 Essential (primary) hypertension: Secondary | ICD-10-CM | POA: Insufficient documentation

## 2023-03-02 DIAGNOSIS — I2510 Atherosclerotic heart disease of native coronary artery without angina pectoris: Secondary | ICD-10-CM | POA: Diagnosis not present

## 2023-03-02 DIAGNOSIS — E782 Mixed hyperlipidemia: Secondary | ICD-10-CM | POA: Diagnosis not present

## 2023-03-02 MED ORDER — METOPROLOL SUCCINATE ER 50 MG PO TB24: 50.0000 mg | ORAL_TABLET | Freq: Every day | ORAL | 2 refills | Status: DC

## 2023-03-02 MED ORDER — VALSARTAN 80 MG PO TABS: 80.0000 mg | ORAL_TABLET | Freq: Every day | ORAL | 1 refills | Status: DC

## 2023-03-02 NOTE — Telephone Encounter (Signed)
-----   Message from Philbert Riser sent at 03/02/2023  2:47 PM EDT ----- Regarding: CT Per Dr.Pemberton, Mr.Preisler is scheduled for ct on 05/29/23.  Thanks

## 2023-03-02 NOTE — Patient Instructions (Signed)
Medication Instructions:   INCREASE YOUR METOPROLOL SUCCINATE (TOPROL XL) TO 50 MG BY MOUTH DAILY  START TAKING VALSARTAN 80 MG BY MOUTH DAILY  *If you need a refill on your cardiac medications before your next appointment, please call your pharmacy*   Lab Work:  [facility] OFFICE--BMET  If you have labs (blood work) drawn today and your tests are completely normal, you will receive your results only by: [place] (if you have MyChart) OR A paper copy in the mail If you have any lab test that is abnormal or we need to change your treatment, we will call you to review the results.   Testing/Procedures:  CT ANGIO CHEST AORTA SCHEDULED TO BE DONE IN JUNE 2024--ORDERS PREVIOUSLY PLACED LAST YEAR--JUST NEEDS SCHEDULING   Follow-Up: At [facility], you and your health needs are our priority.  As part of our continuing mission to provide you with exceptional heart care, we have created designated Provider Care Teams.  These Care Teams include your primary Cardiologist (physician) and Advanced Practice Providers (APPs -  Physician Assistants and Nurse Practitioners) who all work together to provide you with the care you need, when you need it.  We recommend signing up for the patient portal called "MyChart".  Sign up information is provided on this After Visit Summary.  MyChart is used to connect with patients for Virtual Visits (Telemedicine).  Patients are able to view lab/test results, encounter notes, upcoming appointments, etc.  Non-urgent messages can be sent to your provider as well.   To learn more about what you can do with MyChart, go to [email or web address].    Your next appointment:   6 month(s)  Provider:   DR. Johney Frame

## 2023-03-09 ENCOUNTER — Encounter: Payer: Self-pay | Admitting: Cardiology

## 2023-03-13 ENCOUNTER — Ambulatory Visit: Payer: Medicare Other

## 2023-03-20 ENCOUNTER — Ambulatory Visit: Payer: Medicare Other | Attending: Cardiology

## 2023-03-20 DIAGNOSIS — I2510 Atherosclerotic heart disease of native coronary artery without angina pectoris: Secondary | ICD-10-CM | POA: Diagnosis not present

## 2023-03-20 DIAGNOSIS — I5022 Chronic systolic (congestive) heart failure: Secondary | ICD-10-CM

## 2023-03-20 DIAGNOSIS — I10 Essential (primary) hypertension: Secondary | ICD-10-CM

## 2023-03-20 DIAGNOSIS — I7121 Aneurysm of the ascending aorta, without rupture: Secondary | ICD-10-CM | POA: Diagnosis not present

## 2023-03-20 DIAGNOSIS — E782 Mixed hyperlipidemia: Secondary | ICD-10-CM

## 2023-03-21 LAB — BASIC METABOLIC PANEL
BUN/Creatinine Ratio: 14 (ref 10–24)
BUN: 14 mg/dL (ref 8–27)
CO2: 25 mmol/L (ref 20–29)
Calcium: 9.2 mg/dL (ref 8.6–10.2)
Chloride: 104 mmol/L (ref 96–106)
Creatinine, Ser: 1.01 mg/dL (ref 0.76–1.27)
Glucose: 94 mg/dL (ref 70–99)
Potassium: 4.5 mmol/L (ref 3.5–5.2)
Sodium: 141 mmol/L (ref 134–144)
eGFR: 81 mL/min/{1.73_m2} (ref >59)

## 2023-05-22 ENCOUNTER — Ambulatory Visit: Payer: Medicare Other | Attending: Cardiology

## 2023-05-22 DIAGNOSIS — I7121 Aneurysm of the ascending aorta, without rupture: Secondary | ICD-10-CM | POA: Diagnosis not present

## 2023-05-23 LAB — BASIC METABOLIC PANEL WITH GFR
BUN/Creatinine Ratio: 16 (ref 10–24)
BUN: 14 mg/dL (ref 8–27)
CO2: 22 mmol/L (ref 20–29)
Calcium: 8.9 mg/dL (ref 8.6–10.2)
Chloride: 104 mmol/L (ref 96–106)
Creatinine, Ser: 0.9 mg/dL (ref 0.76–1.27)
Glucose: 85 mg/dL (ref 70–99)
Potassium: 4.2 mmol/L (ref 3.5–5.2)
Sodium: 142 mmol/L (ref 134–144)
eGFR: 92 mL/min/1.73

## 2023-05-29 ENCOUNTER — Ambulatory Visit (HOSPITAL_COMMUNITY)
Admission: RE | Admit: 2023-05-29 | Discharge: 2023-05-29 | Disposition: A | Discharge status: Home / Self Care | Payer: Medicare Other | Source: Ambulatory Visit | Attending: Cardiology | Admitting: Cardiology

## 2023-05-29 DIAGNOSIS — I7121 Aneurysm of the ascending aorta, without rupture: Secondary | ICD-10-CM | POA: Insufficient documentation

## 2023-05-29 MED ORDER — IOHEXOL 350 MG/ML SOLN
75.0000 mL | Freq: Once | INTRAVENOUS | Status: AC | PRN
  Administered 2023-05-29: 75 mL via INTRAVENOUS

## 2023-06-01 ENCOUNTER — Telehealth: Payer: Self-pay | Admitting: *Deleted

## 2023-06-01 DIAGNOSIS — I7121 Aneurysm of the ascending aorta, without rupture: Secondary | ICD-10-CM

## 2023-06-01 NOTE — Telephone Encounter (Signed)
-----   Message from Meriam Sprague, MD sent at 05/31/2023  8:05 PM EDT ----- His CT scan of his aorta shows a stable dilation of 4.3cm. Can plan for yearly echoes going forward for monitoring to decrease radiation exposure or if  he prefers CTA, we can continue with yearly CTAs for monitoring.

## 2023-06-01 NOTE — Telephone Encounter (Signed)
The patient has been notified of the result and verbalized understanding.  All questions (if any) were answered.  Pt aware that we will order for him to get a repeat CT ANGIO CHEST AORTA done in one year for surveillance.  He is aware that I will send our scheduling team a message to call him back to arrange this appt for one year out.   Pt verbalized understanding and agrees with this plan.

## 2023-08-06 ENCOUNTER — Encounter (INDEPENDENT_AMBULATORY_CARE_PROVIDER_SITE_OTHER): Payer: Self-pay

## 2023-09-06 ENCOUNTER — Other Ambulatory Visit: Payer: Self-pay | Admitting: Family Medicine

## 2023-09-06 DIAGNOSIS — E785 Hyperlipidemia, unspecified: Secondary | ICD-10-CM

## 2023-09-06 DIAGNOSIS — Z125 Encounter for screening for malignant neoplasm of prostate: Secondary | ICD-10-CM

## 2023-09-11 ENCOUNTER — Ambulatory Visit: Payer: Medicare Other | Admitting: Cardiology

## 2023-09-11 ENCOUNTER — Other Ambulatory Visit (INDEPENDENT_AMBULATORY_CARE_PROVIDER_SITE_OTHER): Payer: Medicare Other

## 2023-09-11 DIAGNOSIS — I7121 Aneurysm of the ascending aorta, without rupture: Secondary | ICD-10-CM

## 2023-09-11 DIAGNOSIS — E785 Hyperlipidemia, unspecified: Secondary | ICD-10-CM | POA: Diagnosis not present

## 2023-09-11 DIAGNOSIS — Z125 Encounter for screening for malignant neoplasm of prostate: Secondary | ICD-10-CM

## 2023-09-11 LAB — COMPREHENSIVE METABOLIC PANEL
ALT: 16 U/L (ref 0–53)
AST: 15 U/L (ref 0–37)
Albumin: 4.4 g/dL (ref 3.5–5.2)
Alkaline Phosphatase: 50 U/L (ref 39–117)
BUN: 15 mg/dL (ref 6–23)
CO2: 29 mEq/L (ref 19–32)
Calcium: 9.1 mg/dL (ref 8.4–10.5)
Chloride: 104 mEq/L (ref 96–112)
Creatinine, Ser: 1.08 mg/dL (ref 0.40–1.50)
GFR: 69.56 mL/min (ref >60.00)
Glucose, Bld: 97 mg/dL (ref 70–99)
Potassium: 4.1 mEq/L (ref 3.5–5.1)
Sodium: 141 mEq/L (ref 135–145)
Total Bilirubin: 1.2 mg/dL (ref 0.2–1.2)
Total Protein: 6.6 g/dL (ref 6.0–8.3)

## 2023-09-11 LAB — LIPID PANEL
Cholesterol: 121 mg/dL (ref 0–200)
HDL: 56.2 mg/dL (ref >39.00)
LDL Cholesterol: 52 mg/dL (ref 0–99)
NonHDL: 64.64
Total CHOL/HDL Ratio: 2
Triglycerides: 61 mg/dL (ref 0.0–149.0)
VLDL: 12.2 mg/dL (ref 0.0–40.0)

## 2023-09-11 LAB — PSA, MEDICARE: PSA: 3.32 ng/ml (ref 0.10–4.00)

## 2023-09-11 NOTE — Addendum Note (Signed)
Addended by: Alvina Chou on: 09/11/2023 08:34 AM   Modules accepted: Orders

## 2023-09-14 ENCOUNTER — Other Ambulatory Visit: Payer: Medicare Other

## 2023-09-20 ENCOUNTER — Encounter: Payer: Self-pay | Admitting: Cardiovascular Disease

## 2023-09-20 NOTE — Progress Notes (Unsigned)
  Cardiology Office Note:  .   Date:  09/21/2023  ID:  ODEL SCHMID, DOB 11-Dec-1951, MRN [id number] PCP: Joaquim Nam, MD  [facility] Providers Cardiologist:  previously Hollice Espy,  now Keysha Damewood  Click to update primary MD,subspecialty MD or APP then REFRESH:1}   History of Present Illness: Randy Pittman   Randy Pittman is a 72 y.o. male with a hx of ascending aortic aneurism  Hx of CAD, ( s/p DES to LCX, marginal.  RCA may need stenting if he has sx)  Hx of  HLD, PVCs   Hx of systolic CHF EF has improved On valsartan, metoprolol   Asc. Aorta measures 43 mm   Denies any chest pain , no dyspnea   Walks 2 miles a day, 6 days a week  Retired as a Estate manager/land agent at [employer] taking his valsartan 4 months ago   BP is normal today . Will remove it from his list     ROS:   Studies Reviewed: .            Risk Assessment/Calculations:             Physical Exam:   VS:  BP 130/84   Pulse 68   Ht 6\' 2"  (1.88 m)   Wt 212 lb (96.2 kg)   SpO2 97%   BMI 27.22 kg/m    Wt Readings from Last 3 Encounters:  09/21/23 212 lb (96.2 kg)  09/21/23 209 lb (94.8 kg)  03/02/23 218 lb 12.8 oz (99.2 kg)    GEN: Well nourished, well developed in no acute distress NECK: No JVD; No carotid bruits CARDIAC: RRR, no murmurs, rubs, gallops RESPIRATORY:  Clear to auscultation without rales, wheezing or rhonchi  ABDOMEN: Soft, non-tender, non-distended EXTREMITIES:  No edema; No deformity   ASSESSMENT AND PLAN: .     HTN:      cont same meds  2. HLD :   cont zetia 10 , atorvastatin 80 mg a day .  LDL is 52 3.  PVCs:   benign , stable  4.  Dilated asc. Aorta :  4.3 cm          Dispo: 1 year with Dr. Ernst Bowler   Signed, Kristeen Miss, MD

## 2023-09-21 ENCOUNTER — Ambulatory Visit: Payer: Medicare Other | Attending: Cardiology | Admitting: Cardiovascular Disease

## 2023-09-21 ENCOUNTER — Ambulatory Visit (INDEPENDENT_AMBULATORY_CARE_PROVIDER_SITE_OTHER): Payer: Medicare Other | Admitting: Family Medicine

## 2023-09-21 ENCOUNTER — Encounter: Payer: Self-pay | Admitting: Cardiovascular Disease

## 2023-09-21 ENCOUNTER — Encounter: Payer: Self-pay | Admitting: Family Medicine

## 2023-09-21 VITALS — BP 130/84 | HR 68 | Ht 74.0 in | Wt 212.0 lb

## 2023-09-21 VITALS — BP 118/80 | HR 71 | Temp 98.4°F | Ht 74.0 in | Wt 209.0 lb

## 2023-09-21 DIAGNOSIS — I2510 Atherosclerotic heart disease of native coronary artery without angina pectoris: Secondary | ICD-10-CM | POA: Insufficient documentation

## 2023-09-21 DIAGNOSIS — Z23 Encounter for immunization: Secondary | ICD-10-CM

## 2023-09-21 DIAGNOSIS — Z0000 Encounter for general adult medical examination without abnormal findings: Secondary | ICD-10-CM | POA: Diagnosis not present

## 2023-09-21 DIAGNOSIS — I5022 Chronic systolic (congestive) heart failure: Secondary | ICD-10-CM | POA: Insufficient documentation

## 2023-09-21 DIAGNOSIS — I10 Essential (primary) hypertension: Secondary | ICD-10-CM | POA: Insufficient documentation

## 2023-09-21 DIAGNOSIS — I7121 Aneurysm of the ascending aorta, without rupture: Secondary | ICD-10-CM | POA: Diagnosis not present

## 2023-09-21 DIAGNOSIS — Z7189 Other specified counseling: Secondary | ICD-10-CM

## 2023-09-21 DIAGNOSIS — E782 Mixed hyperlipidemia: Secondary | ICD-10-CM | POA: Insufficient documentation

## 2023-09-21 MED ORDER — ATORVASTATIN CALCIUM 80 MG PO TABS: ORAL_TABLET | ORAL | 3 refills | Status: DC

## 2023-09-21 MED ORDER — EZETIMIBE 10 MG PO TABS: 10.0000 mg | ORAL_TABLET | Freq: Every day | ORAL | 3 refills | Status: DC

## 2023-09-21 MED ORDER — METOPROLOL SUCCINATE ER 50 MG PO TB24: 50.0000 mg | ORAL_TABLET | Freq: Every day | ORAL | 3 refills | Status: DC

## 2023-09-21 NOTE — Progress Notes (Signed)
I have personally reviewed the Medicare Annual Wellness questionnaire and have noted 1. The patient's medical and social history 2. Their use of alcohol, tobacco or illicit drugs 3. Their current medications and supplements 4. The patient's functional ability including ADL's, fall risks, home safety risks and hearing or visual             impairment. 5. Diet and physical activities 6. Evidence for depression or mood disorders  The patients weight, height, BMI have been recorded in the chart and visual acuity is per eye clinic.  I have made referrals, counseling and provided education to the patient based review of the above and I have provided the pt with a written personalized care plan for preventive services.  Provider list updated- see scanned forms.  Routine anticipatory guidance given to patient.  See health maintenance. The possibility exists that previously documented standard health maintenance information may have been brought forward from a previous encounter into this note.  If needed, that same information has been updated to reflect the current situation based on today's encounter.    Flu 2024 Shingles previously done PNA previously done Tetanus 2020 COVID-vaccine previously done Cologuard neg 2023 Prostate cancer screening 2024 Advance directive-wife designated if patient were incapacitated. Cognitive function addressed- see scanned forms- and if abnormal then additional documentation follows.   In addition to [facility] Wellness, follow up visit for the below conditions:  Ascending aorta dilation followed by cardiology.   D/w pt about getting AAA screening done.  He'll consider.    Elevated Cholesterol: Using medications without problems: yes Muscle aches: no Diet compliance:yes Exercise:yes Walking 2 miles each AM.    Hypertension:    Using medication without problems or lightheadedness: He is off valsartan in the meantime.  Still on metoprolol.  Not lightheaded.    Chest pain with exertion:no Edema:minimal occ BLE edema.   Short of breath:no Labs d/w pt.   PMH and SH reviewed  Meds, vitals, and allergies reviewed.   ROS: Per HPI.  Unless specifically indicated otherwise in HPI, the patient denies:  General: fever. Eyes: acute vision changes ENT: sore throat Cardiovascular: chest pain Respiratory: SOB GI: vomiting GU: dysuria Musculoskeletal: acute back pain Derm: acute rash Neuro: acute motor dysfunction Psych: worsening mood Endocrine: polydipsia Heme: bleeding Allergy: hayfever  GEN: nad, alert and oriented HEENT: ncat NECK: supple w/o LA CV: rrr. PULM: ctab, no inc wob ABD: soft, +bs EXT: no edema SKIN: no acute rash

## 2023-09-21 NOTE — Assessment & Plan Note (Signed)
Flu 2024 Shingles previously done PNA previously done Tetanus 2020 COVID-vaccine previously done Cologuard neg 2023 Prostate cancer screening 2024 Advance directive-wife designated if patient were incapacitated. Cognitive function addressed- see scanned forms- and if abnormal then additional documentation follows.

## 2023-09-21 NOTE — Patient Instructions (Addendum)
Medication Instructions:   STOP TAKING VALSARTAN NOW  *If you need a refill on your cardiac medications before your next appointment, please call your pharmacy*   Testing/Procedures:  CT ANGIO CHEST AORTA W/WO CONTRAST TO BE DONE IN JUNE 2025 (SCHEDULING ORDER WAS ALREADY PLACED, PLEASE SCHEDULE)    Follow-Up: At [facility], you and your health needs are our priority.  As part of our continuing mission to provide you with exceptional heart care, we have created designated Provider Care Teams.  These Care Teams include your primary Cardiologist (physician) and Advanced Practice Providers (APPs -  Physician Assistants and Nurse Practitioners) who all work together to provide you with the care you need, when you need it.  We recommend signing up for the patient portal called "MyChart".  Sign up information is provided on this After Visit Summary.  MyChart is used to connect with patients for Virtual Visits (Telemedicine).  Patients are able to view lab/test results, encounter notes, upcoming appointments, etc.  Non-urgent messages can be sent to your provider as well.   To learn more about what you can do with MyChart, go to [email or web address].    Your next appointment:   1 year(s)  Provider:   Jodelle Red, MD

## 2023-09-21 NOTE — Assessment & Plan Note (Signed)
Continue work on diet and exercise.  Continue metoprolol.  BP reasonable off valsartan.

## 2023-09-21 NOTE — Assessment & Plan Note (Signed)
Advance directive- wife designated if patient were incapacitated.  

## 2023-09-21 NOTE — Assessment & Plan Note (Signed)
Continue work on diet and exercise.  Continue atorvastatin and zetia.

## 2023-09-21 NOTE — Patient Instructions (Addendum)
Please consider abdominal aortic ultrasound.  Let me know if you want to get that set up.    Pneumonia 20 vaccine when possible.    Covid booster when possible.   Take care.  Glad to see you.

## 2023-10-16 ENCOUNTER — Ambulatory Visit (INDEPENDENT_AMBULATORY_CARE_PROVIDER_SITE_OTHER): Payer: Medicare Other

## 2023-10-16 ENCOUNTER — Telehealth: Payer: Self-pay

## 2023-10-16 DIAGNOSIS — Z136 Encounter for screening for cardiovascular disorders: Secondary | ICD-10-CM

## 2023-10-16 DIAGNOSIS — Z87891 Personal history of nicotine dependence: Secondary | ICD-10-CM

## 2023-10-16 DIAGNOSIS — Z23 Encounter for immunization: Secondary | ICD-10-CM

## 2023-10-16 NOTE — Telephone Encounter (Signed)
Patient came in today and stated that he would like to have the US done that was discussed in TE note on 09/17/22. He is ready to get that done and would like to have done in [place].

## 2023-10-16 NOTE — Progress Notes (Signed)
Per orders of Dr. Crawford Givens, injection of  Prevnar 20  given by Wilburn Cornelia in left deltoid. Patient tolerated injection well.

## 2023-10-18 NOTE — Addendum Note (Signed)
Addended by: Joaquim Nam on: 10/18/2023 09:39 PM   Modules accepted: Orders

## 2023-10-18 NOTE — Telephone Encounter (Signed)
Please let him know I put in the order and he should get a call about getting scheduled.  If he does not get called in the next 2 weeks then please let us know.  Thanks.

## 2023-10-19 NOTE — Telephone Encounter (Signed)
Called patient reviewed all information and repeated back to me. Will call if any questions.  ? ?

## 2023-10-20 ENCOUNTER — Ambulatory Visit
Admission: RE | Admit: 2023-10-20 | Discharge: 2023-10-20 | Disposition: A | Discharge status: Home / Self Care | Payer: Medicare Other | Source: Ambulatory Visit | Attending: Family Medicine | Admitting: Family Medicine

## 2023-10-20 DIAGNOSIS — Z136 Encounter for screening for cardiovascular disorders: Secondary | ICD-10-CM | POA: Diagnosis not present

## 2023-10-20 DIAGNOSIS — N400 Enlarged prostate without lower urinary tract symptoms: Secondary | ICD-10-CM | POA: Diagnosis not present

## 2023-10-20 DIAGNOSIS — Z87891 Personal history of nicotine dependence: Secondary | ICD-10-CM | POA: Diagnosis not present

## 2023-10-20 DIAGNOSIS — I2510 Atherosclerotic heart disease of native coronary artery without angina pectoris: Secondary | ICD-10-CM | POA: Diagnosis not present

## 2023-10-20 DIAGNOSIS — I493 Ventricular premature depolarization: Secondary | ICD-10-CM | POA: Diagnosis not present

## 2024-05-06 ENCOUNTER — Ambulatory Visit (INDEPENDENT_AMBULATORY_CARE_PROVIDER_SITE_OTHER): Admitting: Family Medicine

## 2024-05-06 ENCOUNTER — Ambulatory Visit (INDEPENDENT_AMBULATORY_CARE_PROVIDER_SITE_OTHER)
Admission: RE | Admit: 2024-05-06 | Discharge: 2024-05-06 | Disposition: A | Discharge status: Home / Self Care | Source: Ambulatory Visit | Attending: Family Medicine | Admitting: Family Medicine

## 2024-05-06 VITALS — BP 148/78 | HR 81 | Temp 98.5°F | Ht 74.0 in | Wt 216.8 lb

## 2024-05-06 DIAGNOSIS — R232 Flushing: Secondary | ICD-10-CM | POA: Diagnosis not present

## 2024-05-06 DIAGNOSIS — R0602 Shortness of breath: Secondary | ICD-10-CM

## 2024-05-06 DIAGNOSIS — I10 Essential (primary) hypertension: Secondary | ICD-10-CM | POA: Diagnosis not present

## 2024-05-06 NOTE — Patient Instructions (Addendum)
Go to the lab on the way out.   If you have mychart we'll likely use that to update you.  Take care.  Glad to see you. Don't change your meds yet.   

## 2024-05-06 NOTE — Progress Notes (Signed)
 He was concerned about metoprolol  use/effect.  He noted some SOB, over the last few weeks.  He has episodes with inc resp rate.  Can happen at rest.  He has episodic sx, episodes can last about 15 to 20 min. He is still walking 2 miles per day, is walking 17-19 minute mile pace.  That is baseline for him.  Pulse 107 to 110 in the midst of walking.  Not having breathing sx while walking.  He doesn't have heart rate elevation on his watch during events.   No wheeze.  No CP.  Has been on metoprolol  50mg  for >1 year.    He had an episode at the clinic prior to evaluation where his face turned red.  He can have episodes of that independent of resp sx.  Can last about 15 minutes.  Episode at clinic resolved by time I saw patient.  He doesn't have sx other than the change in skin appearance during events.    Not SOB supine.   Meds, vitals, and allergies reviewed.   ROS: Per HPI unless specifically indicated in ROS section   GEN: nad, alert and oriented HEENT: mucous membranes moist NECK: supple w/o LA CV: rrr.  no murmur PULM: ctab, no inc wob ABD: soft, +bs EXT: no edema SKIN: no acute rash but 1cm benign appearing SK on the L superior scalp.

## 2024-05-07 LAB — CBC WITH DIFFERENTIAL/PLATELET
Absolute Lymphocytes: 1216 {cells}/uL (ref 850–3900)
Absolute Monocytes: 688 {cells}/uL (ref 200–950)
Basophils Absolute: 40 {cells}/uL (ref 0–200)
Basophils Relative: 0.5 %
Eosinophils Absolute: 280 {cells}/uL (ref 15–500)
Eosinophils Relative: 3.5 %
HCT: 49.6 % (ref 38.5–50.0)
Hemoglobin: 15.9 g/dL (ref 13.2–17.1)
MCH: 29.7 pg (ref 27.0–33.0)
MCHC: 32.1 g/dL (ref 32.0–36.0)
MCV: 92.5 fL (ref 80.0–100.0)
MPV: 11.3 fL (ref 7.5–12.5)
Monocytes Relative: 8.6 %
Neutro Abs: 5776 {cells}/uL (ref 1500–7800)
Neutrophils Relative %: 72.2 %
Platelets: 171 10*3/uL (ref 140–400)
RBC: 5.36 10*6/uL (ref 4.20–5.80)
RDW: 12.1 % (ref 11.0–15.0)
Total Lymphocyte: 15.2 %
WBC: 8 10*3/uL (ref 3.8–10.8)

## 2024-05-07 LAB — COMPREHENSIVE METABOLIC PANEL WITH GFR
AG Ratio: 2.2 (calc) (ref 1.0–2.5)
ALT: 16 U/L (ref 9–46)
AST: 15 U/L (ref 10–35)
Albumin: 4.6 g/dL (ref 3.6–5.1)
Alkaline phosphatase (APISO): 56 U/L (ref 35–144)
BUN: 16 mg/dL (ref 7–25)
CO2: 28 mmol/L (ref 20–32)
Calcium: 9.4 mg/dL (ref 8.6–10.3)
Chloride: 103 mmol/L (ref 98–110)
Creat: 1.09 mg/dL (ref 0.70–1.28)
Globulin: 2.1 g/dL (ref 1.9–3.7)
Glucose, Bld: 92 mg/dL (ref 65–99)
Potassium: 4 mmol/L (ref 3.5–5.3)
Sodium: 141 mmol/L (ref 135–146)
Total Bilirubin: 1 mg/dL (ref 0.2–1.2)
Total Protein: 6.7 g/dL (ref 6.1–8.1)
eGFR: 73 mL/min/{1.73_m2} (ref >=60)

## 2024-05-07 LAB — TSH: TSH: 1.54 m[IU]/L (ref 0.40–4.50)

## 2024-05-07 LAB — BRAIN NATRIURETIC PEPTIDE: Brain Natriuretic Peptide: 31 pg/mL (ref <100)

## 2024-05-09 ENCOUNTER — Ambulatory Visit: Payer: Self-pay | Admitting: Family Medicine

## 2024-05-09 ENCOUNTER — Ambulatory Visit: Payer: Self-pay

## 2024-05-09 DIAGNOSIS — R0602 Shortness of breath: Secondary | ICD-10-CM | POA: Insufficient documentation

## 2024-05-09 NOTE — Telephone Encounter (Signed)
 Copied from CRM [id number]. Topic: Clinical - Red Word Triage >> May 09, 2024  1:33 PM Freya Jesus wrote: Red Word that prompted transfer to Nurse Triage: Patient spouse stated he's having problems since last week and they need to do something about that. He saw Dr. Vallarie Gauze Friday and he is still having breathing episodes. Patient is requesting a call from Dr. Vallarie Gauze or his nurse.   Patient's wife calling to discuss the patient's lab and x-ray results and to see what Dr. Vallarie Gauze would like them to do next for the patient's breathing. I advised that Dr. Vallarie Gauze has not had a chance to review the results and once he has someone should be reaching out to them. She understood and is agreeable with this plan. She states that the patient has not had any changes to his symptoms and will call back for any new or worsening symptoms.    Reason for Disposition  Caller requesting lab results  (Exception: Routine or non-urgent lab result.)  Answer Assessment - Initial Assessment Questions 1. REASON FOR CALL or QUESTION: "What is your reason for calling today?" or "How can I best help you?" or "What question do you have that I can help answer?"     Lab and x-ray results and instructions  2. CALLER: Document the source of call. (e.g., laboratory, patient).     Patient's wife Abe Abed  Protocols used: PCP Call - No Triage-A-AH

## 2024-05-09 NOTE — Telephone Encounter (Signed)
 Noted.  Thanks.  I am working on his results.

## 2024-05-09 NOTE — Assessment & Plan Note (Signed)
 With episodes of unexplained increase in respiratory rate.  Also with separate episodes of skin flushing, where his face turns red.  Unclear source of both.  He is on metoprolol  for an extended period of time and he still has good exercise tolerance with walking.  He does not get short of breath or have increased respiratory rate with walking.  Discussed that I think it makes sense to check his labs and x-ray first, before we make any other changes.  He agreed. 33 minutes were devoted to patient care in this encounter (this includes time spent reviewing the patient's file/history, interviewing and examining the patient, counseling/reviewing plan with patient).

## 2024-05-12 ENCOUNTER — Telehealth (HOSPITAL_BASED_OUTPATIENT_CLINIC_OR_DEPARTMENT_OTHER): Payer: Self-pay | Admitting: Cardiology

## 2024-05-12 ENCOUNTER — Encounter: Payer: Self-pay | Admitting: Family Medicine

## 2024-05-12 NOTE — Telephone Encounter (Signed)
 Noted. Thanks.

## 2024-05-12 NOTE — Telephone Encounter (Signed)
 Pt c/o Shortness Of Breath: STAT if SOB developed within the last 24 hours or pt is noticeably SOB on the phone  1. Are you currently SOB (can you hear that pt is SOB on the phone)?   No  2. How long have you been experiencing SOB?   A couple of months  3. Are you SOB when sitting or when up moving around?   Mostly when sitting  4. Are you currently experiencing any other symptoms?   Dizzy every once in a while   Patient stated he has trouble breathing which comes and goes.

## 2024-05-12 NOTE — Telephone Encounter (Signed)
 Called and spoke to patient. Patient verified with 2 identifiers (DOB and Last Name)  Patient states:  - Pt is concerned that he has not heard from his PCP about his shortness of breath.  - Pt had test drawn and CXR with PCP that were negative / unremarkable.  - Wants to know what his next steps should be.  Nurse's Recommendations:  - Informed pt of cardiology's input given to PCP of metoprolol  taper.  - Advised pt to keep track of BP/HR and to let PCP know of any symptoms in the next 2 weeks after Metoprolol  taper.  No further concerns at this time. Patient verbalized understanding.

## 2024-05-31 ENCOUNTER — Ambulatory Visit (HOSPITAL_COMMUNITY)
Admission: RE | Admit: 2024-05-31 | Discharge: 2024-05-31 | Disposition: A | Discharge status: Home / Self Care | Payer: Medicare Other | Source: Ambulatory Visit | Attending: Cardiology | Admitting: Cardiology

## 2024-05-31 DIAGNOSIS — I7121 Aneurysm of the ascending aorta, without rupture: Secondary | ICD-10-CM | POA: Insufficient documentation

## 2024-05-31 MED ORDER — IOHEXOL 350 MG/ML SOLN
100.0000 mL | Freq: Once | INTRAVENOUS | Status: AC | PRN
  Administered 2024-05-31: 100 mL via INTRAVENOUS

## 2024-06-02 ENCOUNTER — Ambulatory Visit: Payer: Self-pay | Admitting: Cardiovascular Disease

## 2024-06-06 ENCOUNTER — Ambulatory Visit (INDEPENDENT_AMBULATORY_CARE_PROVIDER_SITE_OTHER): Admitting: Family Medicine

## 2024-06-06 ENCOUNTER — Encounter: Payer: Self-pay | Admitting: Family Medicine

## 2024-06-06 VITALS — BP 142/82 | HR 91 | Temp 98.7°F | Ht 74.0 in | Wt 217.8 lb

## 2024-06-06 DIAGNOSIS — I10 Essential (primary) hypertension: Secondary | ICD-10-CM | POA: Diagnosis not present

## 2024-06-06 DIAGNOSIS — I2510 Atherosclerotic heart disease of native coronary artery without angina pectoris: Secondary | ICD-10-CM | POA: Diagnosis not present

## 2024-06-06 DIAGNOSIS — R232 Flushing: Secondary | ICD-10-CM | POA: Diagnosis not present

## 2024-06-06 LAB — T4, FREE: Free T4: 1.3 ng/dL (ref 0.8–1.8)

## 2024-06-06 NOTE — Progress Notes (Signed)
 Discussed recent imaging.  Lungs/Pleura: Lungs are clear. No pleural effusion or pneumothorax.   IMPRESSION: 1. 4.3 cm ascending thoracic aortic aneurysm, unchanged. Recommend follow-up CTA in 1 year. 2. Calcific coronary artery and aortic atherosclerosis.  Has been off metoprolol  for 10 days.  Off vit B12.    He can have labored breathing but not in distress, at rest.  He walked 1 mile yesterday.  No CP.  Not SOB with that.    He can have episodes where he gets skin flushing.  This is separate from the above.  Stopping metoprolol  didn't help.  No syncope.  Not dizzy.  Not lightheaded.  Wife had noted some changes with trying to recall names, dates; occ but not consistently.  Wife noted him repeating some questions.    He doesn't fell unwell o/w.  Some coffee per day, at baseline.    He doesn't feel heart racing during events.    H/o CAD, FH CAD.   Meds, vitals, and allergies reviewed.   ROS: Per HPI unless specifically indicated in ROS section

## 2024-06-06 NOTE — Patient Instructions (Signed)
 Go to the lab on the way out.   If you have mychart we'll likely use that to update you.    Take care.  Glad to see you. I'll update cardiology.   You should get a call about the carotid artery study.

## 2024-06-08 ENCOUNTER — Other Ambulatory Visit: Payer: Self-pay

## 2024-06-08 DIAGNOSIS — I2510 Atherosclerotic heart disease of native coronary artery without angina pectoris: Secondary | ICD-10-CM

## 2024-06-08 DIAGNOSIS — I10 Essential (primary) hypertension: Secondary | ICD-10-CM | POA: Diagnosis not present

## 2024-06-08 DIAGNOSIS — R232 Flushing: Secondary | ICD-10-CM

## 2024-06-08 LAB — TRYPTASE: Tryptase: 6.4 ug/L (ref <11.0)

## 2024-06-09 ENCOUNTER — Ambulatory Visit: Payer: Self-pay | Admitting: Family Medicine

## 2024-06-09 DIAGNOSIS — R232 Flushing: Secondary | ICD-10-CM | POA: Insufficient documentation

## 2024-06-09 NOTE — Assessment & Plan Note (Signed)
 We talked about evaluating his carotids with ultrasound given his history of vascular disease.  Ordered.

## 2024-06-09 NOTE — Assessment & Plan Note (Addendum)
 He has a history of flushing.  Also with noted history of coronary disease.  Unclear if the two are in any way related.  We talked about checking labs today related to flushing.  See notes on labs.  I do not have a clear cause for his breathing changes.  I am going to ask for cardiology input.  He does not have typical exertional symptoms and I do not suspect a primary pulmonary issue.  It is unclear if his labs will shed any light on his condition.  See notes on those when they are resulted.

## 2024-06-11 LAB — CATECHOLAMINE+VMA, 24-HR URINE
Dopamine , 24H Ur: 308 ug/(24.h) (ref 0–510)
Dopamine, Rand Ur: 108 ug/L
Epinephrine, 24H Ur: 17 ug/(24.h) (ref 0–20)
Epinephrine, Rand Ur: 6 ug/L
Norepinephrine, 24H Ur: <43 ug/(24.h) (ref 0–135)
Norepinephrine, Rand Ur: <15 ug/L
VMA, 24H Ur Adult: 4.3 mg/(24.h) (ref 0.0–7.5)
VMA, Urine: 1.5 mg/L

## 2024-06-11 LAB — SPECIMEN STATUS REPORT

## 2024-06-16 ENCOUNTER — Other Ambulatory Visit: Payer: Self-pay | Admitting: Family Medicine

## 2024-06-16 DIAGNOSIS — I493 Ventricular premature depolarization: Secondary | ICD-10-CM

## 2024-06-16 DIAGNOSIS — R0602 Shortness of breath: Secondary | ICD-10-CM

## 2024-06-17 ENCOUNTER — Ambulatory Visit: Attending: Family Medicine

## 2024-06-17 DIAGNOSIS — I493 Ventricular premature depolarization: Secondary | ICD-10-CM

## 2024-06-17 DIAGNOSIS — R0602 Shortness of breath: Secondary | ICD-10-CM

## 2024-06-20 ENCOUNTER — Ambulatory Visit (HOSPITAL_COMMUNITY)
Admission: RE | Admit: 2024-06-20 | Discharge: 2024-06-20 | Disposition: A | Discharge status: Home / Self Care | Source: Ambulatory Visit | Attending: Cardiovascular Disease | Admitting: Cardiovascular Disease

## 2024-06-20 DIAGNOSIS — R0602 Shortness of breath: Secondary | ICD-10-CM | POA: Diagnosis not present

## 2024-06-20 DIAGNOSIS — I493 Ventricular premature depolarization: Secondary | ICD-10-CM | POA: Diagnosis not present

## 2024-06-20 LAB — ECHOCARDIOGRAM COMPLETE: S' Lateral: 3.02 cm

## 2024-06-20 MED ORDER — PERFLUTREN LIPID MICROSPHERE
1.0000 mL | INTRAVENOUS | Status: AC | PRN
  Administered 2024-06-20: 2 mL via INTRAVENOUS

## 2024-06-23 ENCOUNTER — Ambulatory Visit: Payer: Self-pay | Admitting: Family Medicine

## 2024-07-01 ENCOUNTER — Ambulatory Visit (HOSPITAL_COMMUNITY)
Admission: RE | Admit: 2024-07-01 | Discharge: 2024-07-01 | Disposition: A | Discharge status: Home / Self Care | Source: Ambulatory Visit | Attending: Family Medicine | Admitting: Family Medicine

## 2024-07-01 DIAGNOSIS — I10 Essential (primary) hypertension: Secondary | ICD-10-CM | POA: Insufficient documentation

## 2024-07-01 DIAGNOSIS — I2510 Atherosclerotic heart disease of native coronary artery without angina pectoris: Secondary | ICD-10-CM | POA: Insufficient documentation

## 2024-07-05 NOTE — Telephone Encounter (Signed)
 Please contact cardiology and see about moving his appointment sooner.  Thanks.

## 2024-07-05 NOTE — Telephone Encounter (Signed)
 Patient has been scheduled to be seen on 7/16

## 2024-07-06 ENCOUNTER — Other Ambulatory Visit (HOSPITAL_COMMUNITY): Payer: Self-pay

## 2024-07-06 ENCOUNTER — Ambulatory Visit: Attending: Cardiology | Admitting: Internal Medicine

## 2024-07-06 ENCOUNTER — Encounter: Payer: Self-pay | Admitting: Internal Medicine

## 2024-07-06 ENCOUNTER — Other Ambulatory Visit: Payer: Self-pay | Admitting: *Deleted

## 2024-07-06 VITALS — BP 141/81 | HR 87 | Ht 74.0 in | Wt 218.2 lb

## 2024-07-06 DIAGNOSIS — R0602 Shortness of breath: Secondary | ICD-10-CM

## 2024-07-06 DIAGNOSIS — I5022 Chronic systolic (congestive) heart failure: Secondary | ICD-10-CM | POA: Insufficient documentation

## 2024-07-06 DIAGNOSIS — I10 Essential (primary) hypertension: Secondary | ICD-10-CM

## 2024-07-06 DIAGNOSIS — I2510 Atherosclerotic heart disease of native coronary artery without angina pectoris: Secondary | ICD-10-CM | POA: Diagnosis not present

## 2024-07-06 DIAGNOSIS — Z79899 Other long term (current) drug therapy: Secondary | ICD-10-CM | POA: Diagnosis not present

## 2024-07-06 MED ORDER — SACUBITRIL-VALSARTAN 24-26 MG PO TABS
1.0000 | ORAL_TABLET | Freq: Two times a day (BID) | ORAL | 6 refills | Status: DC
  Filled 2024-07-06 (×2): qty 60, 30d supply, fill #0

## 2024-07-06 NOTE — Progress Notes (Signed)
 Cardiology Office Note:  .    Date:  07/06/2024  ID:  Randy Pittman, DOB [birth year], MRN [id number] PCP: Cleatus Arlyss RAMAN, MD  [facility] Providers Cardiologist:  [name], MD     CC: Transitiont to new cardiologist  History of Present Illness: Randy Pittman    Randy Pittman is a 73 y.o. male with coronary artery disease and aortic dilation who presents with shortness of breath. He was referred by Dr. Cleatus for evaluation of shortness of breath and cardiac monitoring.  He experiences shortness of breath characterized by the need to take deeper breaths, without associated pain or gasping for air. His weight remains stable between 210 and 215 pounds.  He has a history of coronary artery disease with blockages in the right coronary artery and circumflex artery, and a stent was placed in the left circumflex artery in 2018. He has experienced premature atrial contractions and premature ventricular contractions in the past.  He has mild aortic dilation, with a current measurement of 44 millimeters on CT. He has been monitored for this condition over several years.  I personally reviewed these images with patient and wife  He has a family history of heart disease, with an identical twin who has a defibrillator and pacemaker due to severe heart issues. His twin also had coronary artery disease and required a stent.  He maintains an active lifestyle and follows a healthy diet, avoiding junk and fried foods. He previously took metoprolol , which was tapered off two months ago without significant changes in symptoms. He currently takes aspirin  and cholesterol-lowering medications since 2018.   Discussed the use of AI scribe software for clinical note transcription with the patient, who gave verbal consent to proceed.   Relevant histories: .  Social  - former [employer], [employer], [employer] patient ROS: As per HPI.   Studies Reviewed: .     Cardiac Studies & Procedures    ______________________________________________________________________________________________ CARDIAC CATHETERIZATION  CARDIAC CATHETERIZATION 01/01/2017  Conclusion  There is mild left ventricular systolic dysfunction.  Dist RCA lesion, 100 %stenosed.  Mid RCA lesion, 65 %stenosed.  A STENT SYNERGY DES 2.5X28 drug eluting stent was successfully placed.  Lat 1st Mrg lesion, 99 %stenosed.  Post intervention, there is a 0% residual stenosis.  Low-normal to mild LV dysfunction with an ejection fraction of 50% with a small focal region of distal inferior hypocontractility.  Significant 2 vessel coronary obstructive disease with a subtotal 99 100% stenosis involving the inferior branch of the obtuse marginal 1 vessel; and 65% mid RCA stenosis proximal to the acute margin with total occlusion of the PLA with retrograde collateralization to the distal PLA via the AV groove circumflex branches.  LAD with mild luminal irregularity.  Successful PCI to the circumflex marginal vessel with ultimate insertion of a Synergy DES 2.528 mm stent postdilated 2.75 mm with the 99% stenosis reduced to 0% and resumption of brisk TIMI-3 flow.  RECOMMENDATION: The patient should continue on dual antiplatelet therapy for minimum of a year, but possibly indefinitely.  Initial medical therapy for his RCA disease, but if his mid RCA lesion progresses future stenting may be necessary.  I will change his simvastatin  to atorvastatin  80 mg and initiate beta blocker therapy and possibly nitrates.  The patient does not experience chest pain.  Findings Coronary Findings Diagnostic  Dominance: Right  Left Circumflex  Lateral First Obtuse Marginal Branch  Right Coronary Artery Collaterals Dist RCA filled by collaterals from Dist Cx.  Intervention  Lat 1st  Mrg lesion Angioplasty Lesion crossed with guidewire. Pre-stent angioplasty was performed using a BALLOON MOZEC 2.0X12. A STENT SYNERGY DES 2.5X28 drug  eluting stent was successfully placed. Post-stent angioplasty was performed using a BALLOON [place] MOZEC 2.75X15. The pre-interventional distal flow is decreased (TIMI 2).  The post-interventional distal flow is normal (TIMI 3). The intervention was successful . No complications occurred at this lesion. There is a 0% residual stenosis post intervention.   STRESS TESTS  MYOCARDIAL PERFUSION IMAGING 11/17/2017  Interpretation Summary  Nuclear stress EF: 46%.  Blood pressure demonstrated a normal response to exercise.  No T wave inversion was noted during stress.  There was no ST segment deviation noted during stress.  Defect 1: There is a large defect of moderate severity.  This is an intermediate risk study.  Large size, moderate severity fixed inferior perfusion defect which is most likely scar, no reversible ischemia. LVEF 46% with inferior hypokinesis. This is an intermediate risk study.   ECHOCARDIOGRAM  ECHOCARDIOGRAM COMPLETE 06/20/2024  Narrative ECHOCARDIOGRAM REPORT    Patient Name:   Randy Pittman Date of Exam: 06/20/2024 Medical Rec #:  [id number]       Height:       74.0 in Accession #:    [id number]      Weight:       217.8 lb Date of Birth:  Aug 22, 1951       BSA:          2.254 m Patient Age:    73 years        BP:           142/82 mmHg Patient Gender: M               HR:           82 bpm. Exam Location:  [address]  Procedure: 2D Echo and Intracardiac Opacification Agent (Both Spectral and Color Flow Doppler were utilized during procedure).  Indications:    R06.00 Dyspnea  History:        Patient has prior history of Echocardiogram examinations, most recent 06/02/2018. CAD, Arrythmias:LBBB and PVC, Signs/Symptoms:Shortness of Breath; Risk Factors:Hypertension, Dyslipidemia and Family History of Coronary Artery Disease. Ischemic cardiomyopathy. Ascending aorta dilatation.  Sonographer:    [name] RCS Referring Phys: [id number] ARLYSS RAMAN  DUNCAN  IMPRESSIONS   1. No left ventricular thrombus is seen (Definity  contrast was used). Left ventricular ejection fraction, by estimation, is 45 to 50%. The left ventricle has mildly decreased function. The left ventricle demonstrates regional wall motion abnormalities (see scoring diagram/findings for description). Left ventricular diastolic parameters are consistent with Grade I diastolic dysfunction (impaired relaxation). There is mild hypokinesis of the left ventricular, basal-mid inferior wall and inferolateral wall. 2. Right ventricular systolic function is normal. The right ventricular size is normal. 3. The mitral valve is normal in structure. No evidence of mitral valve regurgitation. No evidence of mitral stenosis. 4. The aortic valve is tricuspid. Aortic valve regurgitation is mild. No aortic stenosis is present. 5. Aortic dilatation noted. There is moderate dilatation of the aortic root, measuring 46 mm. There is moderate dilatation of the ascending aorta, measuring 45 mm. There is Severe (Grade IV) protruding plaque involving the aortic arch.  Comparison(s): A prior study was performed on 05/01/2017. No significant change from prior study. Prior images reviewed side by side.  FINDINGS Left Ventricle: No left ventricular thrombus is seen (Definity  contrast was used). Left ventricular ejection fraction, by estimation, is 45 to 50%. The left  ventricle has mildly decreased function. The left ventricle demonstrates regional wall motion abnormalities. Mild hypokinesis of the left ventricular, basal-mid inferior wall and inferolateral wall. The left ventricular internal cavity size was normal in size. There is borderline concentric left ventricular hypertrophy. Abnormal (paradoxical) septal motion, consistent with left bundle branch block. Left ventricular diastolic parameters are consistent with Grade I diastolic dysfunction (impaired relaxation).   LV Wall Scoring: The inferior wall  and posterior wall are hypokinetic.  Right Ventricle: The right ventricular size is normal. No increase in right ventricular wall thickness. Right ventricular systolic function is normal.  Left Atrium: Left atrial size was normal in size.  Right Atrium: Right atrial size was normal in size.  Pericardium: There is no evidence of pericardial effusion.  Mitral Valve: The mitral valve is normal in structure. No evidence of mitral valve regurgitation. No evidence of mitral valve stenosis.  Tricuspid Valve: The tricuspid valve is normal in structure. Tricuspid valve regurgitation is trivial.  Aortic Valve: The aortic valve is tricuspid. Aortic valve regurgitation is mild. No aortic stenosis is present.  Pulmonic Valve: The pulmonic valve was grossly normal. Pulmonic valve regurgitation is mild. No evidence of pulmonic stenosis.  Aorta: Aortic dilatation noted. There is moderate dilatation of the aortic root, measuring 46 mm. There is moderate dilatation of the ascending aorta, measuring 45 mm. There is severe (Grade IV) protruding plaque involving the aortic arch.  IAS/Shunts: No atrial level shunt detected by color flow Doppler.   LEFT VENTRICLE PLAX 2D LVIDd:         3.79 cm   Diastology LVIDs:         3.02 cm   LV e' medial:    6.74 cm/s LV PW:         1.20 cm   LV E/e' medial:  9.4 LV IVS:        1.26 cm   LV e' lateral:   6.42 cm/s LVOT diam:     2.40 cm   LV E/e' lateral: 9.9 LV SV:         82 LV SV Index:   36 LVOT Area:     4.52 cm   RIGHT VENTRICLE RV Basal diam:  2.64 cm RV S prime:     7.94 cm/s TAPSE (M-mode): 1.6 cm  LEFT ATRIUM             Index        RIGHT ATRIUM           Index LA diam:        3.00 cm 1.33 cm/m   RA Area:     12.50 cm LA Vol (A2C):   54.6 ml 24.23 ml/m  RA Volume:   24.20 ml  10.74 ml/m LA Vol (A4C):   27.4 ml 12.16 ml/m LA Biplane Vol: 40.0 ml 17.75 ml/m AORTIC VALVE LVOT Vmax:   82.30 cm/s LVOT Vmean:  56.600 cm/s LVOT VTI:    0.181  m  AORTA Ao Root diam: 4.60 cm Ao Asc diam:  4.50 cm  MV E velocity: 63.40 cm/s   TRICUSPID VALVE MV A velocity: 109.00 cm/s  TR Peak grad:   19.5 mmHg MV E/A ratio:  0.58         TR Vmax:        221.00 cm/s  SHUNTS Systemic VTI:  0.18 m Systemic Diam: 2.40 cm  Jerel Croitoru MD Electronically signed by Jerel Balding MD Signature Date/Time: 06/20/2024/2:03:04 PM    Final  ______________________________________________________________________________________________       Physical Exam:    VS:  BP (!) 141/81 (BP Location: Left Arm, Patient Position: Sitting)   Pulse 87   Ht 6' 2 (1.88 m)   Wt 218 lb 3.2 oz (99 kg)   SpO2 96%   BMI 28.02 kg/m    Wt Readings from Last 3 Encounters:  07/06/24 218 lb 3.2 oz (99 kg)  06/06/24 217 lb 12.8 oz (98.8 kg)  05/06/24 216 lb 12.8 oz (98.3 kg)    Gen: no distress  Neck: No JVD Cardiac: No Rubs or Gallops, no murmur, RRR +2 radial pulses Respiratory: Clear to auscultation bilaterally, normal effort, normal  respiratory rate GI: Soft, nontender, non-distended  MS: No  edema;  moves all extremities Integument: Skin feels warm Neuro:  At time of evaluation, alert and oriented to person/place/time/situation  Psych: Normal affect, patient feels ok   ASSESSMENT AND PLAN: .    An EKG was ordered for CAD and shows new inferior infarct pattern  Chronic heart failure with reduced ejection fraction - euvolemic NYHA II Mildly reduced ejection fraction likely related to previous myocardial infarction. No current medications for heart failure. - Start  Entresto  24/26 mg to improve heart function and manage symptoms of shortness of breath. - Order basic metabolic panel and BNP in 2-3 weeks to monitor electrolytes and fluid status. - Instruct to monitor blood pressure regularly to determine appropriate dosing (increase ARNI dose vs start SGLT2i) - Follow up on heart monitor results and consider restarting metoprolol   succinate  Coronary artery disease with history of PCI RCA and circumflex artery disease with previous PCI in 2018. Mild ischemic cardiomyopathy without evidence of new blockages. No current need for additional stenting. - Continue aspirin  for secondary prevention. - Monitor for symptoms and consider further evaluation if symptoms persist despite current management (sx despite maximal GDMT)  Mild Aortic dilation - Repeat CT scan in 1 year to monitor aortic dilation.  Hyperlipidemia Managed with cholesterol-lowering medications since 2018. Current regimen appears effective in maintaining cholesterol levels. - Continue current cholesterol-lowering medications. LDL < 70  Fall f/u with my team  Stanly Leavens, MD FASE [facility] Cardiologist [facility]  [address] [place], [address] [place], [place] [address] [phone]  4:19 PM

## 2024-07-06 NOTE — Patient Instructions (Addendum)
 Medication Instructions:  Please start Entresto  24-26 mg one tablet twice a day. Continue all other medications as listed.  *If you need a refill on your cardiac medications before your next appointment, please call your pharmacy*  Lab Work: Please have blood work in 2 weeks at your closest [facility] (BMP, proBNP)  If you have labs (blood work) drawn today and your tests are completely normal, you will receive your results only by: MyChart Message (if you have MyChart) OR A paper copy in the mail If you have any lab test that is abnormal or we need to change your treatment, we will call you to review the results.   Follow-Up: At [facility], you and your health needs are our priority.  As part of our continuing mission to provide you with exceptional heart care, our providers are all part of one team.  This team includes your primary Cardiologist (physician) and Advanced Practice Providers or APPs (Physician Assistants and Nurse Practitioners) who all work together to provide you with the care you need, when you need it.  Your next appointment:   6 month(s)  Provider:   One of our Advanced Practice Providers (APPs): Morse Clause, PA-C  Lamarr Satterfield, NP Miriam Shams, NP  Olivia Pavy, PA-C Josefa Beauvais, NP  Leontine Salen, PA-C Orren Fabry, PA-C  [place], PA-C Ernest Dick, NP  Damien Braver, NP Jon Hails, PA-C  Waddell Donath, PA-C    Dayna Dunn, PA-C  Scott Weaver, PA-C Lum Louis, NP Katlyn West, NP Callie Goodrich, PA-C  Evan Williams, PA-C Sheng Haley, PA-C  Xika Zhao, NP Kathleen Johnson, PA-C       We recommend signing up for the patient portal called MyChart.  Sign up information is provided on this After Visit Summary.  MyChart is used to connect with patients for Virtual Visits (Telemedicine).  Patients are able to view lab/test results, encounter notes, upcoming appointments, etc.  Non-urgent messages can be sent to your provider as well.   To  learn more about what you can do with MyChart, go to [email or web address].

## 2024-07-07 ENCOUNTER — Other Ambulatory Visit (HOSPITAL_COMMUNITY): Payer: Self-pay

## 2024-07-11 ENCOUNTER — Encounter: Payer: Self-pay | Admitting: Family Medicine

## 2024-07-12 DIAGNOSIS — R0602 Shortness of breath: Secondary | ICD-10-CM | POA: Diagnosis not present

## 2024-07-12 DIAGNOSIS — I493 Ventricular premature depolarization: Secondary | ICD-10-CM | POA: Diagnosis not present

## 2024-07-20 ENCOUNTER — Encounter: Payer: Self-pay | Admitting: Family Medicine

## 2024-07-20 DIAGNOSIS — I493 Ventricular premature depolarization: Secondary | ICD-10-CM

## 2024-07-20 DIAGNOSIS — R0602 Shortness of breath: Secondary | ICD-10-CM

## 2024-07-21 ENCOUNTER — Telehealth: Payer: Self-pay | Admitting: Internal Medicine

## 2024-07-21 ENCOUNTER — Encounter (INDEPENDENT_AMBULATORY_CARE_PROVIDER_SITE_OTHER): Payer: Self-pay | Admitting: Internal Medicine

## 2024-07-21 DIAGNOSIS — Z79899 Other long term (current) drug therapy: Secondary | ICD-10-CM | POA: Diagnosis not present

## 2024-07-21 DIAGNOSIS — I10 Essential (primary) hypertension: Secondary | ICD-10-CM | POA: Diagnosis not present

## 2024-07-21 DIAGNOSIS — R0602 Shortness of breath: Secondary | ICD-10-CM | POA: Diagnosis not present

## 2024-07-21 DIAGNOSIS — I5022 Chronic systolic (congestive) heart failure: Secondary | ICD-10-CM | POA: Diagnosis not present

## 2024-07-21 DIAGNOSIS — I255 Ischemic cardiomyopathy: Secondary | ICD-10-CM

## 2024-07-21 NOTE — Telephone Encounter (Signed)
 Pts wife requesting a c/b in regards to MyChart message

## 2024-07-21 NOTE — Telephone Encounter (Signed)
 Reviewed monitor with PCP - SOB with HF and no high risk features.  - no symptom change with stopping BB - Recommend GDMT but would start with Entresto  (started at index visit)  Please see the MyChart message reply(ies) for my assessment and plan.    This patient gave consent for this Medical Advice Message and is aware that it may result in a bill to [employer], as well as the possibility of receiving a bill for a co-payment or deductible. They are an established patient, but are not seeking medical advice exclusively about a problem treated during an in person or video visit in the last seven days. I did not recommend an in person or video visit within seven days of my reply.    I spent a total of 20 minutes cumulative time within 7 days through [employer].  Stanly DELENA Leavens, MD

## 2024-07-21 NOTE — Telephone Encounter (Signed)
 Called spoke with pt and spouse.  Advised of my chart response.  MD is not in the office but will send concern to be addressed.

## 2024-07-22 ENCOUNTER — Ambulatory Visit: Payer: Self-pay | Admitting: Internal Medicine

## 2024-07-22 LAB — BASIC METABOLIC PANEL WITH GFR
BUN/Creatinine Ratio: 16 (ref 10–24)
BUN: 16 mg/dL (ref 8–27)
CO2: 22 mmol/L (ref 20–29)
Calcium: 9.1 mg/dL (ref 8.6–10.2)
Chloride: 103 mmol/L (ref 96–106)
Creatinine, Ser: 0.97 mg/dL (ref 0.76–1.27)
Glucose: 97 mg/dL (ref 70–99)
Potassium: 4.4 mmol/L (ref 3.5–5.2)
Sodium: 142 mmol/L (ref 134–144)
eGFR: 83 mL/min/1.73 (ref >59)

## 2024-07-22 LAB — PRO B NATRIURETIC PEPTIDE: NT-Pro BNP: <36 pg/mL (ref 0–376)

## 2024-07-27 ENCOUNTER — Encounter: Payer: Self-pay | Admitting: Internal Medicine

## 2024-07-27 MED ORDER — SACUBITRIL-VALSARTAN 24-26 MG PO TABS: 1.0000 | ORAL_TABLET | Freq: Two times a day (BID) | ORAL | 2 refills | Status: AC

## 2024-07-27 NOTE — Addendum Note (Signed)
 Addended by: RANDY HAMP SAILOR on: 07/27/2024 01:30 PM   Modules accepted: Orders

## 2024-08-17 ENCOUNTER — Telehealth: Payer: Self-pay

## 2024-08-17 MED ORDER — EMPAGLIFLOZIN 10 MG PO TABS: 10.0000 mg | ORAL_TABLET | Freq: Every day | ORAL | 3 refills | Status: DC

## 2024-08-17 MED ORDER — EMPAGLIFLOZIN 10 MG PO TABS: 10.0000 mg | ORAL_TABLET | Freq: Every day | ORAL | Status: DC

## 2024-08-17 NOTE — Addendum Note (Signed)
 Addended by: RANDY HAMP SAILOR on: 08/17/2024 05:41 PM   Modules accepted: Orders

## 2024-08-17 NOTE — Telephone Encounter (Signed)
 Medication name/dosage: Samples List: Jardiance  10 mg  Administration instructions: 1 by mouth daily before breakfast  Reason for samples: Reason for samples: new start  Ordering provider: Stanly Leavens, MD  RN obtained samples and left at Coumadin clinic for pick up.  Pt made aware during phone call encounter.

## 2024-08-17 NOTE — Telephone Encounter (Signed)
 Called pt advised of MD recommendations: At our index visit we discussed: Instruct to monitor blood pressure regularly to determine appropriate dosing (increase ARNI dose vs start SGLT2i)  - in General BP is well controlled - No change in symptoms - Would start Jardiance  10 mg PO daily as we discussed at our visit given no improvement - need follow up with my team in two weeks from this start - if he has no improvement we should plan for [facility] (he had this procedure in 2018 back when Dr. Maranda was leading his care.   Thanks, MAC  Pt is agreeable to plan.  2 weeks of Jardiance  samples placed at coumadin clinic to pick up.

## 2024-09-02 ENCOUNTER — Ambulatory Visit: Attending: Internal Medicine | Admitting: Internal Medicine

## 2024-09-02 ENCOUNTER — Encounter: Payer: Self-pay | Admitting: Internal Medicine

## 2024-09-02 VITALS — BP 127/78 | HR 88 | Resp 16 | Ht 74.0 in | Wt 209.2 lb

## 2024-09-02 DIAGNOSIS — I2510 Atherosclerotic heart disease of native coronary artery without angina pectoris: Secondary | ICD-10-CM | POA: Insufficient documentation

## 2024-09-02 DIAGNOSIS — I255 Ischemic cardiomyopathy: Secondary | ICD-10-CM | POA: Insufficient documentation

## 2024-09-02 DIAGNOSIS — I5022 Chronic systolic (congestive) heart failure: Secondary | ICD-10-CM | POA: Insufficient documentation

## 2024-09-02 LAB — CBC

## 2024-09-02 NOTE — Progress Notes (Signed)
 Cardiology Office Note:  .    Date:  09/02/2024  ID:  Randy Pittman, DOB 1951/05/10, MRN [id number] PCP: Cleatus Arlyss RAMAN, MD  [facility] Providers Cardiologist:  [name], MD     CC: Transitiont to new cardiologist  History of Present Illness: Randy Pittman is a 73 y.o. male  with heart failure and coronary artery disease who presents with persistent shortness of breath.  He has a history of heart failure with reduced ejection fraction and coronary artery disease involving the right coronary artery, for which he underwent stenting in 2018. He also has a moderate thoracic aortic aneurysm with mild aortic insufficiency.  Earlier this year, he established care and was started on goal-directed medical therapy for heart failure. Some medications were held due to relative hypotension, and he was unable to tolerate an SGLT2 inhibitor, noting no noticeable difference while on it.  He experiences persistent shortness of breath, particularly with exertion, though he does not feel 'out of breath' or 'gasping for breath'. He describes it as not getting 'real good deep breaths'. He also experiences occasional dizziness, likened to 'when you stand up too fast'.  His weight has remained stable. His heart rate has increased slightly but remains below 90 bpm, and his blood pressure readings are generally stable, with recent readings around 100/78 to 127/unknown.  He recalls having no symptoms before his RCA stenting in 2018 and did not notice a difference in symptoms post-procedure. He occasionally feels a 'tinge' in his chest, but not during the current visit.  Discussed the use of AI scribe software for clinical note transcription with the patient, who gave verbal consent to proceed.  Relevant histories: .  Social  - former [facility], [facility], [facility] patient - comes with wife ROS: As per HPI.   Studies Reviewed: .     Cardiac Studies & Procedures    ______________________________________________________________________________________________ CARDIAC CATHETERIZATION  CARDIAC CATHETERIZATION 01/01/2017  Conclusion  There is mild left ventricular systolic dysfunction.  Dist RCA lesion, 100 %stenosed.  Mid RCA lesion, 65 %stenosed.  A STENT SYNERGY DES 2.5X28 drug eluting stent was successfully placed.  Lat 1st Mrg lesion, 99 %stenosed.  Post intervention, there is a 0% residual stenosis.  Low-normal to mild LV dysfunction with an ejection fraction of 50% with a small focal region of distal inferior hypocontractility.  Significant 2 vessel coronary obstructive disease with a subtotal 99 100% stenosis involving the inferior branch of the obtuse marginal 1 vessel; and 65% mid RCA stenosis proximal to the acute margin with total occlusion of the PLA with retrograde collateralization to the distal PLA via the AV groove circumflex branches.  LAD with mild luminal irregularity.  Successful PCI to the circumflex marginal vessel with ultimate insertion of a Synergy DES 2.528 mm stent postdilated 2.75 mm with the 99% stenosis reduced to 0% and resumption of brisk TIMI-3 flow.  RECOMMENDATION: The patient should continue on dual antiplatelet therapy for minimum of a year, but possibly indefinitely.  Initial medical therapy for his RCA disease, but if his mid RCA lesion progresses future stenting may be necessary.  I will change his simvastatin  to atorvastatin  80 mg and initiate beta blocker therapy and possibly nitrates.  The patient does not experience chest pain.  Findings Coronary Findings Diagnostic  Dominance: Right  Left Circumflex  Lateral First Obtuse Marginal Branch  Right Coronary Artery Collaterals Dist RCA filled by collaterals from Dist Cx.  Intervention  Lat 1st Mrg lesion Angioplasty  Lesion crossed with guidewire. Pre-stent angioplasty was performed using a BALLOON MOZEC 2.0X12. A STENT SYNERGY DES 2.5X28 drug  eluting stent was successfully placed. Post-stent angioplasty was performed using a BALLOON  MOZEC 2.75X15. The pre-interventional distal flow is decreased (TIMI 2).  The post-interventional distal flow is normal (TIMI 3). The intervention was successful . No complications occurred at this lesion. There is a 0% residual stenosis post intervention.   STRESS TESTS  MYOCARDIAL PERFUSION IMAGING 11/17/2017  Interpretation Summary  Nuclear stress EF: 46%.  Blood pressure demonstrated a normal response to exercise.  No T wave inversion was noted during stress.  There was no ST segment deviation noted during stress.  Defect 1: There is a large defect of moderate severity.  This is an intermediate risk study.  Large size, moderate severity fixed inferior perfusion defect which is most likely scar, no reversible ischemia. LVEF 46% with inferior hypokinesis. This is an intermediate risk study.   ECHOCARDIOGRAM  ECHOCARDIOGRAM COMPLETE 06/20/2024  Narrative ECHOCARDIOGRAM REPORT    Patient Name:   Randy Pittman Stamper Date of Exam: 06/20/2024 Medical Rec #:  [id number]       Height:       74.0 in Accession #:    [id number]      Weight:       217.8 lb Date of Birth:  27-Mar-1951       BSA:          2.254 m Patient Age:    73 years        BP:           142/82 mmHg Patient Gender: M               HR:           82 bpm. Exam Location:  [address]  Procedure: 2D Echo and Intracardiac Opacification Agent (Both Spectral and Color Flow Doppler were utilized during procedure).  Indications:    R06.00 Dyspnea  History:        Patient has prior history of Echocardiogram examinations, most recent 06/02/2018. CAD, Arrythmias:LBBB and PVC, Signs/Symptoms:Shortness of Breath; Risk Factors:Hypertension, Dyslipidemia and Family History of Coronary Artery Disease. Ischemic cardiomyopathy. Ascending aorta dilatation.  Sonographer:    [name] RCS Referring Phys: [id number] ARLYSS RAMAN  DUNCAN  IMPRESSIONS   1. No left ventricular thrombus is seen (Definity  contrast was used). Left ventricular ejection fraction, by estimation, is 45 to 50%. The left ventricle has mildly decreased function. The left ventricle demonstrates regional wall motion abnormalities (see scoring diagram/findings for description). Left ventricular diastolic parameters are consistent with Grade I diastolic dysfunction (impaired relaxation). There is mild hypokinesis of the left ventricular, basal-mid inferior wall and inferolateral wall. 2. Right ventricular systolic function is normal. The right ventricular size is normal. 3. The mitral valve is normal in structure. No evidence of mitral valve regurgitation. No evidence of mitral stenosis. 4. The aortic valve is tricuspid. Aortic valve regurgitation is mild. No aortic stenosis is present. 5. Aortic dilatation noted. There is moderate dilatation of the aortic root, measuring 46 mm. There is moderate dilatation of the ascending aorta, measuring 45 mm. There is Severe (Grade IV) protruding plaque involving the aortic arch.  Comparison(s): A prior study was performed on 05/01/2017. No significant change from prior study. Prior images reviewed side by side.  FINDINGS Left Ventricle: No left ventricular thrombus is seen (Definity  contrast was used). Left ventricular ejection fraction, by estimation, is 45 to 50%. The left ventricle has mildly  decreased function. The left ventricle demonstrates regional wall motion abnormalities. Mild hypokinesis of the left ventricular, basal-mid inferior wall and inferolateral wall. The left ventricular internal cavity size was normal in size. There is borderline concentric left ventricular hypertrophy. Abnormal (paradoxical) septal motion, consistent with left bundle branch block. Left ventricular diastolic parameters are consistent with Grade I diastolic dysfunction (impaired relaxation).   LV Wall Scoring: The inferior wall  and posterior wall are hypokinetic.  Right Ventricle: The right ventricular size is normal. No increase in right ventricular wall thickness. Right ventricular systolic function is normal.  Left Atrium: Left atrial size was normal in size.  Right Atrium: Right atrial size was normal in size.  Pericardium: There is no evidence of pericardial effusion.  Mitral Valve: The mitral valve is normal in structure. No evidence of mitral valve regurgitation. No evidence of mitral valve stenosis.  Tricuspid Valve: The tricuspid valve is normal in structure. Tricuspid valve regurgitation is trivial.  Aortic Valve: The aortic valve is tricuspid. Aortic valve regurgitation is mild. No aortic stenosis is present.  Pulmonic Valve: The pulmonic valve was grossly normal. Pulmonic valve regurgitation is mild. No evidence of pulmonic stenosis.  Aorta: Aortic dilatation noted. There is moderate dilatation of the aortic root, measuring 46 mm. There is moderate dilatation of the ascending aorta, measuring 45 mm. There is severe (Grade IV) protruding plaque involving the aortic arch.  IAS/Shunts: No atrial level shunt detected by color flow Doppler.   LEFT VENTRICLE PLAX 2D LVIDd:         3.79 cm   Diastology LVIDs:         3.02 cm   LV e' medial:    6.74 cm/s LV PW:         1.20 cm   LV E/e' medial:  9.4 LV IVS:        1.26 cm   LV e' lateral:   6.42 cm/s LVOT diam:     2.40 cm   LV E/e' lateral: 9.9 LV SV:         82 LV SV Index:   36 LVOT Area:     4.52 cm   RIGHT VENTRICLE RV Basal diam:  2.64 cm RV S prime:     7.94 cm/s TAPSE (M-mode): 1.6 cm  LEFT ATRIUM             Index        RIGHT ATRIUM           Index LA diam:        3.00 cm 1.33 cm/m   RA Area:     12.50 cm LA Vol (A2C):   54.6 ml 24.23 ml/m  RA Volume:   24.20 ml  10.74 ml/m LA Vol (A4C):   27.4 ml 12.16 ml/m LA Biplane Vol: 40.0 ml 17.75 ml/m AORTIC VALVE LVOT Vmax:   82.30 cm/s LVOT Vmean:  56.600 cm/s LVOT VTI:    0.181  m  AORTA Ao Root diam: 4.60 cm Ao Asc diam:  4.50 cm  MV E velocity: 63.40 cm/s   TRICUSPID VALVE MV A velocity: 109.00 cm/s  TR Peak grad:   19.5 mmHg MV E/A ratio:  0.58         TR Vmax:        221.00 cm/s  SHUNTS Systemic VTI:  0.18 m Systemic Diam: 2.40 cm  Randy Croitoru MD Electronically signed by Randy Balding MD Signature Date/Time: 06/20/2024/2:03:04 PM    Final    MONITORS  LONG TERM MONITOR (3-14 DAYS) 07/12/2024  Narrative HR 47 to 207, average 74. 14 nonsustained SVT, longest 17 beats. Occasional supraventricular ectopy, 1.7% Rare supraventricular and ventricular ectopy. No sustained arrhythmias. No atrial fibrillation.  Randy T. Cindie, MD, [facility], [facility] Cardiac Electrophysiology       ______________________________________________________________________________________________       Physical Exam:    VS:  BP 127/78 (BP Location: Left Arm, Patient Position: Sitting, Cuff Size: Normal)   Pulse 88   Resp 16   Ht 6' 2 (1.88 m)   Wt 209 lb 3.2 oz (94.9 kg)   SpO2 95%   BMI 26.86 kg/m    Wt Readings from Last 3 Encounters:  09/02/24 209 lb 3.2 oz (94.9 kg)  07/06/24 218 lb 3.2 oz (99 kg)  06/06/24 217 lb 12.8 oz (98.8 kg)    Gen: no distress  Neck: No JVD Cardiac: No Rubs or Gallops, no murmur, RRR +2 radial pulses Respiratory: Clear to auscultation bilaterally, normal effort, normal  respiratory rate GI: Soft, nontender, non-distended  MS: No  edema;  moves all extremities Integument: Skin feels warm Neuro:  At time of evaluation, alert and oriented to person/place/time/situation  Psych: Normal affect, patient feels ok   ASSESSMENT AND PLAN: .    Chronic heart failure with reduced ejection fraction - euvolemic NYHA II - Heart failure with mildly reduced ejection fraction (EF 45%) and coronary artery disease with prior RCA stenting in 2018. Persistent dyspnea despite goal-directed medical therapy. Unable to tolerate SGLT2 inhibitor  due to lack of perceived benefit. Relative hypotension limits further optimization of medical therapy. Differential includes coronary artery disease, heart failure, or non-cardiac causes for symptoms. - Perform left and right heart catheterization via right radial and right AC approach to assess for coronary blockages and evaluate heart pressures - Hold SGLT2 inhibitor until further evaluation  Risks and benefits of cardiac catheterization have been discussed with the patient.  These include bleeding, infection, kidney damage, stroke, heart attack, death.  The patient understands these risks and is willing to proceed.  Access recommendations: R Radial, R AC Procedural considerations no improvement in sx after 2018 PCI, if normal findings SOB may be non-cardiac  Shortness of breath Intermittent dyspnea, not associated with exertion or significant weight gain. No significant peripheral edema or signs of fluid overload. Differential includes coronary artery disease, heart failure, or non-cardiac causes.  Coronary artery disease with history of PCI RCA and circumflex artery disease with previous PCI in 2018. Mild ischemic cardiomyopathy without evidence of new blockages. No current need for additional stenting. - Continue aspirin  for secondary prevention. - cath as above - Continue current cholesterol-lowering medications. LDL < 70  Moderate aortic dilation Summer 2026 CT is pending, Has mild AI and may get echo sooner  Post cath f/u with my team.  Stanly Leavens, MD [facility] Cardiologist [facility]  [address] [place], [address] [place], [place] [address] [phone]  10:20 AM

## 2024-09-02 NOTE — H&P (View-Only) (Signed)
 Cardiology Office Note:  .    Date:  09/02/2024  ID:  Randy Pittman Pittman, DOB 1951/01/27, MRN [id number] PCP: Randy Pittman Randy Pittman RAMAN, MD  [facility] Providers Cardiologist:  Randy Moose, MD     CC: Transitiont to new cardiologist  History of Present Illness: Randy Pittman    Randy Pittman Pittman is a 73 y.o. male  with heart failure and coronary artery disease who presents with persistent shortness of breath.  He has a history of heart failure with reduced ejection fraction and coronary artery disease involving the right coronary artery, for which he underwent stenting in 2018. He also has a moderate thoracic aortic aneurysm with mild aortic insufficiency.  Earlier this year, he established care and was started on goal-directed medical therapy for heart failure. Some medications were held due to relative hypotension, and he was unable to tolerate an SGLT2 inhibitor, noting no noticeable difference while on it.  He experiences persistent shortness of breath, particularly with exertion, though he does not feel 'out of breath' or 'gasping for breath'. He describes it as not getting 'real good deep breaths'. He also experiences occasional dizziness, likened to 'when you stand up too fast'.  His weight has remained stable. His heart rate has increased slightly but remains below 90 bpm, and his blood pressure readings are generally stable, with recent readings around 100/78 to 127/unknown.  He recalls having no symptoms before his RCA stenting in 2018 and did not notice a difference in symptoms post-procedure. He occasionally feels a 'tinge' in his chest, but not during the current visit.  Discussed the use of AI scribe software for clinical note transcription with the patient, who gave verbal consent to proceed.  Relevant histories: .  Social  - former [facility], [facility], [facility] patient - comes with wife ROS: As per HPI.   Studies Reviewed: .     Cardiac Studies & Procedures    ______________________________________________________________________________________________ CARDIAC CATHETERIZATION  CARDIAC CATHETERIZATION 01/01/2017  Conclusion  There is mild left ventricular systolic dysfunction.  Dist RCA lesion, 100 %stenosed.  Mid RCA lesion, 65 %stenosed.  A STENT SYNERGY DES 2.5X28 drug eluting stent was successfully placed.  Lat 1st Mrg lesion, 99 %stenosed.  Post intervention, there is a 0% residual stenosis.  Low-normal to mild LV dysfunction with an ejection fraction of 50% with a small focal region of distal inferior hypocontractility.  Significant 2 vessel coronary obstructive disease with a subtotal 99 100% stenosis involving the inferior branch of the obtuse marginal 1 vessel; and 65% mid RCA stenosis proximal to the acute margin with total occlusion of the PLA with retrograde collateralization to the distal PLA via the AV groove circumflex branches.  LAD with mild luminal irregularity.  Successful PCI to the circumflex marginal vessel with ultimate insertion of a Synergy DES 2.528 mm stent postdilated 2.75 mm with the 99% stenosis reduced to 0% and resumption of brisk TIMI-3 flow.  RECOMMENDATION: The patient should continue on dual antiplatelet therapy for minimum of a year, but possibly indefinitely.  Initial medical therapy for his RCA disease, but if his mid RCA lesion progresses future stenting may be necessary.  I will change his simvastatin  to atorvastatin  80 mg and initiate beta blocker therapy and possibly nitrates.  The patient does not experience chest pain.  Findings Coronary Findings Diagnostic  Dominance: Right  Left Circumflex  Lateral First Obtuse Marginal Branch  Right Coronary Artery Collaterals Dist RCA filled by collaterals from Dist Cx.  Intervention  Lat 1st Mrg lesion Angioplasty  Lesion crossed with guidewire. Pre-stent angioplasty was performed using a BALLOON MOZEC 2.0X12. A STENT SYNERGY DES 2.5X28 drug  eluting stent was successfully placed. Post-stent angioplasty was performed using a BALLOON [place] MOZEC 2.75X15. The pre-interventional distal flow is decreased (TIMI 2).  The post-interventional distal flow is normal (TIMI 3). The intervention was successful . No complications occurred at this lesion. There is a 0% residual stenosis post intervention.   STRESS TESTS  MYOCARDIAL PERFUSION IMAGING 11/17/2017  Interpretation Summary  Nuclear stress EF: 46%.  Blood pressure demonstrated a normal response to exercise.  No T wave inversion was noted during stress.  There was no ST segment deviation noted during stress.  Defect 1: There is a large defect of moderate severity.  This is an intermediate risk study.  Large size, moderate severity fixed inferior perfusion defect which is most likely scar, no reversible ischemia. LVEF 46% with inferior hypokinesis. This is an intermediate risk study.   ECHOCARDIOGRAM  ECHOCARDIOGRAM COMPLETE 06/20/2024  Narrative ECHOCARDIOGRAM REPORT    Patient Name:   Randy Pittman Pittman Date of Exam: 06/20/2024 Medical Rec #:  [id number]       Height:       74.0 in Accession #:    [id number]      Weight:       217.8 lb Date of Birth:  [birth year]       BSA:          2.254 m Patient Age:    73 years        BP:           142/82 mmHg Patient Gender: M               HR:           82 bpm. Exam Location:  [address]  Procedure: 2D Echo and Intracardiac Opacification Agent (Both Spectral and Color Flow Doppler were utilized during procedure).  Indications:    R06.00 Dyspnea  History:        Patient has prior history of Echocardiogram examinations, most recent 06/02/2018. CAD, Arrythmias:LBBB and PVC, Signs/Symptoms:Shortness of Breath; Risk Factors:Hypertension, Dyslipidemia and Family History of Coronary Artery Disease. Ischemic cardiomyopathy. Ascending aorta dilatation.  Sonographer:    Randy Pittman Pittman Referring Phys: [id number] Randy Pittman Pittman  Randy Pittman Pittman  IMPRESSIONS   1. No left ventricular thrombus is seen (Definity  contrast was used). Left ventricular ejection fraction, by estimation, is 45 to 50%. The left ventricle has mildly decreased function. The left ventricle demonstrates regional wall motion abnormalities (see scoring diagram/findings for description). Left ventricular diastolic parameters are consistent with Grade I diastolic dysfunction (impaired relaxation). There is mild hypokinesis of the left ventricular, basal-mid inferior wall and inferolateral wall. 2. Right ventricular systolic function is normal. The right ventricular size is normal. 3. The mitral valve is normal in structure. No evidence of mitral valve regurgitation. No evidence of mitral stenosis. 4. The aortic valve is tricuspid. Aortic valve regurgitation is mild. No aortic stenosis is present. 5. Aortic dilatation noted. There is moderate dilatation of the aortic root, measuring 46 mm. There is moderate dilatation of the ascending aorta, measuring 45 mm. There is Severe (Grade IV) protruding plaque involving the aortic arch.  Comparison(s): A prior study was performed on 05/01/2017. No significant change from prior study. Prior images reviewed side by side.  FINDINGS Left Ventricle: No left ventricular thrombus is seen (Definity  contrast was used). Left ventricular ejection fraction, by estimation, is 45 to 50%. The left ventricle has mildly  decreased function. The left ventricle demonstrates regional wall motion abnormalities. Mild hypokinesis of the left ventricular, basal-mid inferior wall and inferolateral wall. The left ventricular internal cavity size was normal in size. There is borderline concentric left ventricular hypertrophy. Abnormal (paradoxical) septal motion, consistent with left bundle branch block. Left ventricular diastolic parameters are consistent with Grade I diastolic dysfunction (impaired relaxation).   LV Wall Scoring: The inferior wall  and posterior wall are hypokinetic.  Right Ventricle: The right ventricular size is normal. No increase in right ventricular wall thickness. Right ventricular systolic function is normal.  Left Atrium: Left atrial size was normal in size.  Right Atrium: Right atrial size was normal in size.  Pericardium: There is no evidence of pericardial effusion.  Mitral Valve: The mitral valve is normal in structure. No evidence of mitral valve regurgitation. No evidence of mitral valve stenosis.  Tricuspid Valve: The tricuspid valve is normal in structure. Tricuspid valve regurgitation is trivial.  Aortic Valve: The aortic valve is tricuspid. Aortic valve regurgitation is mild. No aortic stenosis is present.  Pulmonic Valve: The pulmonic valve was grossly normal. Pulmonic valve regurgitation is mild. No evidence of pulmonic stenosis.  Aorta: Aortic dilatation noted. There is moderate dilatation of the aortic root, measuring 46 mm. There is moderate dilatation of the ascending aorta, measuring 45 mm. There is severe (Grade IV) protruding plaque involving the aortic arch.  IAS/Shunts: No atrial level shunt detected by color flow Doppler.   LEFT VENTRICLE PLAX 2D LVIDd:         3.79 cm   Diastology LVIDs:         3.02 cm   LV e' medial:    6.74 cm/s LV PW:         1.20 cm   LV E/e' medial:  9.4 LV IVS:        1.26 cm   LV e' lateral:   6.42 cm/s LVOT diam:     2.40 cm   LV E/e' lateral: 9.9 LV SV:         82 LV SV Index:   36 LVOT Area:     4.52 cm   RIGHT VENTRICLE RV Basal diam:  2.64 cm RV S prime:     7.94 cm/s TAPSE (M-mode): 1.6 cm  LEFT ATRIUM             Index        RIGHT ATRIUM           Index LA diam:        3.00 cm 1.33 cm/m   RA Area:     12.50 cm LA Vol (A2C):   54.6 ml 24.23 ml/m  RA Volume:   24.20 ml  10.74 ml/m LA Vol (A4C):   27.4 ml 12.16 ml/m LA Biplane Vol: 40.0 ml 17.75 ml/m AORTIC VALVE LVOT Vmax:   82.30 cm/s LVOT Vmean:  56.600 cm/s LVOT VTI:    0.181  m  AORTA Ao Root diam: 4.60 cm Ao Asc diam:  4.50 cm  MV E velocity: 63.40 cm/s   TRICUSPID VALVE MV A velocity: 109.00 cm/s  TR Peak grad:   19.5 mmHg MV E/A ratio:  0.58         TR Vmax:        221.00 cm/s  SHUNTS Systemic VTI:  0.18 m Systemic Diam: 2.40 cm  Jerel Croitoru MD Electronically signed by Jerel Balding MD Signature Date/Time: 06/20/2024/2:03:04 PM    Final    MONITORS  LONG TERM MONITOR (3-14 DAYS) 07/12/2024  Narrative HR 47 to 207, average 74. 14 nonsustained SVT, longest 17 beats. Occasional supraventricular ectopy, 1.7% Rare supraventricular and ventricular ectopy. No sustained arrhythmias. No atrial fibrillation.  Ole T. Cindie, MD, [facility], [facility] Cardiac Electrophysiology       ______________________________________________________________________________________________       Physical Exam:    VS:  BP 127/78 (BP Location: Left Arm, Patient Position: Sitting, Cuff Size: Normal)   Pulse 88   Resp 16   Ht 6' 2 (1.88 m)   Wt 209 lb 3.2 oz (94.9 kg)   SpO2 95%   BMI 26.86 kg/m    Wt Readings from Last 3 Encounters:  09/02/24 209 lb 3.2 oz (94.9 kg)  07/06/24 218 lb 3.2 oz (99 kg)  06/06/24 217 lb 12.8 oz (98.8 kg)    Gen: no distress  Neck: No JVD Cardiac: No Rubs or Gallops, no murmur, RRR +2 radial pulses Respiratory: Clear to auscultation bilaterally, normal effort, normal  respiratory rate GI: Soft, nontender, non-distended  MS: No  edema;  moves all extremities Integument: Skin feels warm Neuro:  At time of evaluation, alert and oriented to person/place/time/situation  Psych: Normal affect, patient feels ok   ASSESSMENT AND PLAN: .    Chronic heart failure with reduced ejection fraction - euvolemic NYHA II - Heart failure with mildly reduced ejection fraction (EF 45%) and coronary artery disease with prior RCA stenting in 2018. Persistent dyspnea despite goal-directed medical therapy. Unable to tolerate SGLT2 inhibitor  due to lack of perceived benefit. Relative hypotension limits further optimization of medical therapy. Differential includes coronary artery disease, heart failure, or non-cardiac causes for symptoms. - Perform left and right heart catheterization via right radial and right AC approach to assess for coronary blockages and evaluate heart pressures - Hold SGLT2 inhibitor until further evaluation  Risks and benefits of cardiac catheterization have been discussed with the patient.  These include bleeding, infection, kidney damage, stroke, heart attack, death.  The patient understands these risks and is willing to proceed.  Access recommendations: R Radial, R AC Procedural considerations no improvement in sx after 2018 PCI, if normal findings SOB may be non-cardiac  Shortness of breath Intermittent dyspnea, not associated with exertion or significant weight gain. No significant peripheral edema or signs of fluid overload. Differential includes coronary artery disease, heart failure, or non-cardiac causes.  Coronary artery disease with history of PCI RCA and circumflex artery disease with previous PCI in 2018. Mild ischemic cardiomyopathy without evidence of new blockages. No current need for additional stenting. - Continue aspirin  for secondary prevention. - cath as above - Continue current cholesterol-lowering medications. LDL < 70  Moderate aortic dilation Summer 2026 CT is pending, Has mild AI and may get echo sooner  Post cath f/u with my team.  Stanly Leavens, MD FASE [facility] Cardiologist [facility]  [address] [place], [address] [place], [place] [address] [phone]  10:20 AM

## 2024-09-02 NOTE — Patient Instructions (Signed)
 Medication Instructions:  Your physician recommends that you continue on your current medications as directed. Please refer to the Current Medication list given to you today.  *If you need a refill on your cardiac medications before your next appointment, please call your pharmacy*  Lab Work: BMP and CBC at [facility] on the 1st floor  If you have labs (blood work) drawn today and your tests are completely normal, you will receive your results only by: MyChart Message (if you have MyChart) OR A paper copy in the mail If you have any lab test that is abnormal or we need to change your treatment, we will call you to review the results.  Testing/Procedures: Your physician has requested that you have a cardiac catheterization. Cardiac catheterization is used to diagnose and/or treat various heart conditions. Doctors may recommend this procedure for a number of different reasons. The most common reason is to evaluate chest pain. Chest pain can be a symptom of coronary artery disease (CAD), and cardiac catheterization can show whether plaque is narrowing or blocking your heart's arteries. This procedure is also used to evaluate the valves, as well as measure the blood flow and oxygen levels in different parts of your heart. For further information please visit [email or web address] Please follow instruction sheet, as given.   Follow-Up: At [facility], you and your health needs are our priority.  As part of our continuing mission to provide you with exceptional heart care, our providers are all part of one team.  This team includes your primary Cardiologist (physician) and Advanced Practice Providers or APPs (Physician Assistants and Nurse Practitioners) who all work together to provide you with the care you need, when you need it.  Your next appointment:   6-8 week(s)  Provider:   One of our Advanced Practice Providers (APPs): Morse Clause, PA-C  Lamarr Satterfield, NP Miriam Shams,  NP  Olivia Pavy, PA-C Josefa Beauvais, NP  Leontine Salen, PA-C Orren Fabry, PA-C  Lyons, PA-C Ernest Dick, NP  Damien Braver, NP Jon Hails, PA-C  Waddell Donath, PA-C    Dayna Dunn, PA-C  Scott Weaver, PA-C Lum Louis, NP Katlyn West, NP Callie Goodrich, PA-C  Xika Zhao, NP Sheng Haley, PA-C    Kathleen Johnson, PA-C     Other Instructions  You are scheduled for a Cardiac Catheterization on Wednesday, September 17 with Dr. Peter [place].  1. Please arrive at the [facility] (Main Entrance A) at [facility]: [address] [place], [place] [address] at 12:30 PM (This time is 2 hour(s) before your procedure to ensure your preparation).   Free valet parking service is available. You will check in at ADMITTING. The support person will be asked to wait in the waiting room.  It is OK to have someone drop you off and come back when you are ready to be discharged.    Special note: Every effort is made to have your procedure done on time. Please understand that emergencies sometimes delay scheduled procedures.  2. Diet: Nothing to eat after midnight.   3. Hydration: You need to be well hydrated before your procedure. On September 17, you may drink approved liquids (see below) until 2 hours before the procedure, with 16 oz of water as your last intake.   List of approved liquids water, clear juice, clear tea, black coffee, fruit juices, non-citric and without pulp, carbonated beverages, Gatorade, Kool -Aid, plain Jello-O and plain ice popsicles.  4. Labs: You will need to have  blood drawn on Friday, September 12 at [facility] (1st Floor), [address], [place], [place] [address]. You do not need to be fasting.  5. Medication instructions in preparation for your procedure:   Contrast Allergy: No    On the morning of your procedure, take your Aspirin  81 mg and any morning medicines NOT listed above.  You may use sips of  water.  6. Plan to go home the same day, you will only stay overnight if medically necessary. 7. Bring a current list of your medications and current insurance cards. 8. You MUST have a responsible person to drive you home. 9. Someone MUST be with you the first 24 hours after you arrive home or your discharge will be delayed. 10. Please wear clothes that are easy to get on and off and wear slip-on shoes.  Thank you for allowing us  to care for you!   -- [facility]

## 2024-09-03 LAB — CBC
Hematocrit: 50 (ref 37.5–51.0)
Hemoglobin: 15.9 g/dL (ref 13.0–17.7)
MCH: 30.1 pg (ref 26.6–33.0)
MCHC: 31.8 g/dL (ref 31.5–35.7)
MCV: 95 fL (ref 79–97)
Platelets: 195 x10E3/uL (ref 150–450)
RBC: 5.29 x10E6/uL (ref 4.14–5.80)
RDW: 12.4 (ref 11.6–15.4)
WBC: 6.3 x10E3/uL (ref 3.4–10.8)

## 2024-09-03 LAB — BASIC METABOLIC PANEL WITH GFR
BUN/Creatinine Ratio: 13 (ref 10–24)
BUN: 13 mg/dL (ref 8–27)
CO2: 24 mmol/L (ref 20–29)
Calcium: 9.2 mg/dL (ref 8.6–10.2)
Chloride: 104 mmol/L (ref 96–106)
Creatinine, Ser: 0.99 mg/dL (ref 0.76–1.27)
Glucose: 96 mg/dL (ref 70–99)
Potassium: 4.4 mmol/L (ref 3.5–5.2)
Sodium: 143 mmol/L (ref 134–144)
eGFR: 81 mL/min/1.73 (ref >59)

## 2024-09-05 ENCOUNTER — Ambulatory Visit: Payer: Self-pay

## 2024-09-06 ENCOUNTER — Telehealth: Payer: Self-pay | Admitting: *Deleted

## 2024-09-06 NOTE — Telephone Encounter (Signed)
 Cardiac Catheterization scheduled at [facility] for: Wednesday September 07, 2024 2:30 PM Arrival time [facility] Main Entrance A at: 12:30 PM  Diet: -May have light meal until 8:30 AM. (6 hours before procedure time) Approved light meal consists of plain toast, fruit, light soups, crackers.  Hydration: -May drink clear liquids until 2 hours before the procedure.  Approved liquids: Water , clear tea, black coffee, fruit juices-non-citric and without pulp,Gatorade, plain Jello/popsicles.   -Please drink 16 oz of water  2 hours before procedure.  Medication instructions: -Usual morning medications can be taken including aspirin  81 mg.  Plan to go home the same day, you will only stay overnight if medically necessary.  You must have responsible adult to drive you home.  Someone must be with you the first 24 hours after you arrive home.  Reviewed procedure instructions with patient.

## 2024-09-07 ENCOUNTER — Encounter (HOSPITAL_COMMUNITY)
Admission: RE | Disposition: A | Discharge status: Home / Self Care | Payer: Self-pay | Source: Home / Self Care | Attending: Cardiology

## 2024-09-07 ENCOUNTER — Other Ambulatory Visit: Payer: Self-pay

## 2024-09-07 ENCOUNTER — Ambulatory Visit: Payer: Self-pay | Admitting: Family Medicine

## 2024-09-07 ENCOUNTER — Ambulatory Visit (HOSPITAL_COMMUNITY)
Admission: RE | Admit: 2024-09-07 | Discharge: 2024-09-07 | Disposition: A | Discharge status: Home / Self Care | Attending: Cardiology | Admitting: Cardiology

## 2024-09-07 DIAGNOSIS — Z955 Presence of coronary angioplasty implant and graft: Secondary | ICD-10-CM | POA: Insufficient documentation

## 2024-09-07 DIAGNOSIS — I255 Ischemic cardiomyopathy: Secondary | ICD-10-CM | POA: Insufficient documentation

## 2024-09-07 DIAGNOSIS — Z7982 Long term (current) use of aspirin: Secondary | ICD-10-CM | POA: Insufficient documentation

## 2024-09-07 DIAGNOSIS — I2510 Atherosclerotic heart disease of native coronary artery without angina pectoris: Secondary | ICD-10-CM | POA: Insufficient documentation

## 2024-09-07 DIAGNOSIS — I5022 Chronic systolic (congestive) heart failure: Secondary | ICD-10-CM | POA: Insufficient documentation

## 2024-09-07 DIAGNOSIS — I712 Thoracic aortic aneurysm, without rupture, unspecified: Secondary | ICD-10-CM | POA: Diagnosis not present

## 2024-09-07 DIAGNOSIS — Z7902 Long term (current) use of antithrombotics/antiplatelets: Secondary | ICD-10-CM | POA: Insufficient documentation

## 2024-09-07 DIAGNOSIS — Z79899 Other long term (current) drug therapy: Secondary | ICD-10-CM | POA: Diagnosis not present

## 2024-09-07 DIAGNOSIS — R0602 Shortness of breath: Secondary | ICD-10-CM | POA: Diagnosis present

## 2024-09-07 HISTORY — PX: RIGHT AND LEFT HEART CATH: CATH118262

## 2024-09-07 LAB — POCT I-STAT EG7
Acid-Base Excess: 0 mmol/L (ref 0.0–2.0)
Acid-Base Excess: 0 mmol/L (ref 0.0–2.0)
Acid-Base Excess: 1 mmol/L (ref 0.0–2.0)
Bicarbonate: 25.3 mmol/L (ref 20.0–28.0)
Bicarbonate: 25.7 mmol/L (ref 20.0–28.0)
Bicarbonate: 26.5 mmol/L (ref 20.0–28.0)
Calcium, Ion: 1.16 mmol/L (ref 1.15–1.40)
Calcium, Ion: 1.19 mmol/L (ref 1.15–1.40)
Calcium, Ion: 1.21 mmol/L (ref 1.15–1.40)
HCT: 42 % (ref 39.0–52.0)
HCT: 42 % (ref 39.0–52.0)
HCT: 42 % (ref 39.0–52.0)
Hemoglobin: 14.3 g/dL (ref 13.0–17.0)
Hemoglobin: 14.3 g/dL (ref 13.0–17.0)
Hemoglobin: 14.3 g/dL (ref 13.0–17.0)
O2 Saturation: 79 %
O2 Saturation: 80 %
O2 Saturation: 95 %
Potassium: 3.6 mmol/L (ref 3.5–5.1)
Potassium: 3.6 mmol/L (ref 3.5–5.1)
Potassium: 3.7 mmol/L (ref 3.5–5.1)
Sodium: 142 mmol/L (ref 135–145)
Sodium: 142 mmol/L (ref 135–145)
Sodium: 143 mmol/L (ref 135–145)
TCO2: 27 mmol/L (ref 22–32)
TCO2: 27 mmol/L (ref 22–32)
TCO2: 28 mmol/L (ref 22–32)
pCO2, Ven: 40.7 mmHg — ABNORMAL LOW (ref 44–60)
pCO2, Ven: 44.3 mmHg (ref 44–60)
pCO2, Ven: 45.1 mmHg (ref 44–60)
pH, Ven: 7.371 (ref 7.25–7.43)
pH, Ven: 7.377 (ref 7.25–7.43)
pH, Ven: 7.401 (ref 7.25–7.43)
pO2, Ven: 45 mmHg (ref 32–45)
pO2, Ven: 46 mmHg — ABNORMAL HIGH (ref 32–45)
pO2, Ven: 76 mmHg — ABNORMAL HIGH (ref 32–45)

## 2024-09-07 SURGERY — RIGHT AND LEFT HEART CATH
Anesthesia: LOCAL

## 2024-09-07 MED ORDER — LIDOCAINE HCL (PF) 1 % IJ SOLN
INTRAMUSCULAR | Status: DC | PRN
  Administered 2024-09-07: 10 mL via INTRADERMAL
  Administered 2024-09-07: 5 mL via INTRADERMAL

## 2024-09-07 MED ORDER — VERAPAMIL HCL 2.5 MG/ML IV SOLN
INTRAVENOUS | Status: AC
  Filled 2024-09-07: qty 2

## 2024-09-07 MED ORDER — HEPARIN (PORCINE) IN NACL 1000-0.9 UT/500ML-% IV SOLN
INTRAVENOUS | Status: DC | PRN
  Administered 2024-09-07 (×2): 500 mL

## 2024-09-07 MED ORDER — VERAPAMIL HCL 2.5 MG/ML IV SOLN
INTRAVENOUS | Status: DC | PRN
  Administered 2024-09-07: 10 mL via INTRA_ARTERIAL

## 2024-09-07 MED ORDER — HEPARIN SODIUM (PORCINE) 1000 UNIT/ML IJ SOLN
INTRAMUSCULAR | Status: DC | PRN
  Administered 2024-09-07: 4500 [IU] via INTRAVENOUS

## 2024-09-07 MED ORDER — SODIUM CHLORIDE 0.9 % IV SOLN: 250.0000 mL | INTRAVENOUS | Status: DC | PRN

## 2024-09-07 MED ORDER — SODIUM CHLORIDE 0.9% FLUSH: 3.0000 mL | INTRAVENOUS | Status: DC | PRN

## 2024-09-07 MED ORDER — SODIUM CHLORIDE 0.9% FLUSH: 3.0000 mL | Freq: Two times a day (BID) | INTRAVENOUS | Status: DC

## 2024-09-07 MED ORDER — FENTANYL CITRATE (PF) 100 MCG/2ML IJ SOLN
INTRAMUSCULAR | Status: AC
  Filled 2024-09-07: qty 2

## 2024-09-07 MED ORDER — FREE WATER
500.0000 mL | Freq: Once | Status: DC
Start: 2024-09-07 — End: 2024-09-07

## 2024-09-07 MED ORDER — ONDANSETRON HCL 4 MG/2ML IJ SOLN: 4.0000 mg | Freq: Four times a day (QID) | INTRAMUSCULAR | Status: DC | PRN

## 2024-09-07 MED ORDER — HEPARIN SODIUM (PORCINE) 1000 UNIT/ML IJ SOLN
INTRAMUSCULAR | Status: AC
  Filled 2024-09-07: qty 10

## 2024-09-07 MED ORDER — ACETAMINOPHEN 325 MG PO TABS: 650.0000 mg | ORAL_TABLET | ORAL | Status: DC | PRN

## 2024-09-07 MED ORDER — FREE WATER: 500.0000 mL | Freq: Once | Status: DC

## 2024-09-07 MED ORDER — MIDAZOLAM HCL 2 MG/2ML IJ SOLN
INTRAMUSCULAR | Status: DC | PRN
  Administered 2024-09-07: 1 mg via INTRAVENOUS

## 2024-09-07 MED ORDER — MIDAZOLAM HCL 2 MG/2ML IJ SOLN
INTRAMUSCULAR | Status: AC
  Filled 2024-09-07: qty 2

## 2024-09-07 MED ORDER — ASPIRIN 81 MG PO CHEW: 81.0000 mg | CHEWABLE_TABLET | ORAL | Status: DC

## 2024-09-07 MED ORDER — IOHEXOL 350 MG/ML SOLN
INTRAVENOUS | Status: DC | PRN
  Administered 2024-09-07: 45 mL

## 2024-09-07 MED ORDER — FENTANYL CITRATE (PF) 100 MCG/2ML IJ SOLN
INTRAMUSCULAR | Status: DC | PRN
  Administered 2024-09-07: 25 ug via INTRAVENOUS

## 2024-09-07 MED ORDER — LIDOCAINE HCL (PF) 1 % IJ SOLN
INTRAMUSCULAR | Status: AC
  Filled 2024-09-07: qty 30

## 2024-09-07 SURGICAL SUPPLY — 13 items
CATH 5FR JL3.5 JR4 ANG PIG MP (CATHETERS) IMPLANT
CATH BALLN WEDGE 5F 110CM (CATHETERS) IMPLANT
CATH SWAN GANZ 7F STRAIGHT (CATHETERS) IMPLANT
DEVICE RAD COMP TR BAND LRG (VASCULAR PRODUCTS) IMPLANT
GLIDESHEATH SLEND SS 6F .021 (SHEATH) IMPLANT
GUIDEWIRE .025 260CM (WIRE) IMPLANT
GUIDEWIRE INQWIRE 1.5J.035X260 (WIRE) IMPLANT
KIT MICROPUNCTURE NIT STIFF (SHEATH) IMPLANT
SET ATX-X65L (MISCELLANEOUS) IMPLANT
SHEATH GLIDE SLENDER 4/5FR (SHEATH) IMPLANT
SHEATH PINNACLE 7F 10CM (SHEATH) IMPLANT
SHEATH PROBE COVER 6X72 (BAG) IMPLANT
STATION PROTECTION PRESSURIZED (MISCELLANEOUS) IMPLANT

## 2024-09-07 NOTE — Discharge Instructions (Signed)
 Drink plenty of fluids for 48 hours and keep wrist elevated at heart level for 24 hours  Radial Site Care   This sheet gives you information about how to care for yourself after your procedure. Your health care provider may also give you more specific instructions. If you have problems or questions, contact your health care provider. What can I expect after the procedure? After the procedure, it is common to have: Bruising and tenderness at the catheter insertion area. Follow these instructions at home: Medicines Take over-the-counter and prescription medicines only as told by your health care provider. Insertion site care Follow instructions from your health care provider about how to take care of your insertion site. Make sure you: Wash your hands with soap and water  before you change your bandage (dressing). If soap and water  are not available, use hand sanitizer. Remove your dressing as told by your health care provider. In 24 hours Check your insertion site every day for signs of infection. Check for: Redness, swelling, or pain. Fluid or blood. Pus or a bad smell. Warmth. Do not take baths, swim, or use a hot tub until your health care provider approves. You may shower 24-48 hours after the procedure, or as directed by your health care provider. Remove the dressing and gently wash the site with plain soap and water . Pat the area dry with a clean towel. Do not rub the site. That could cause bleeding. Do not apply powder or lotion to the site. Activity   For 24 hours after the procedure, or as directed by your health care provider: Do not flex or bend the affected arm. Do not push or pull heavy objects with the affected arm. Do not drive yourself home from the hospital or clinic. You may drive 24 hours after the procedure unless your health care provider tells you not to. Do not operate machinery or power tools. Do not lift anything that is heavier than 10 lb (4.5 kg), or the  limit that you are told, until your health care provider says that it is safe.  For 4 days Ask your health care provider when it is okay to: Return to work or school. Resume usual physical activities or sports. Resume sexual activity. General instructions If the catheter site starts to bleed, raise your arm and put firm pressure on the site. If the bleeding does not stop, get help right away. This is a medical emergency. If you went home on the same day as your procedure, a responsible adult should be with you for the first 24 hours after you arrive home. Keep all follow-up visits as told by your health care provider. This is important. Contact a health care provider if: You have a fever. You have redness, swelling, or yellow drainage around your insertion site. Get help right away if: You have unusual pain at the radial site. The catheter insertion area swells very fast. The insertion area is bleeding, and the bleeding does not stop when you hold steady pressure on the area. Your arm or hand becomes pale, cool, tingly, or numb. These symptoms may represent a serious problem that is an emergency. Do not wait to see if the symptoms will go away. Get medical help right away. Call your local emergency services (911 in the [place] Do not drive yourself to the hospital. Summary After the procedure, it is common to have bruising and tenderness at the site. Follow instructions from your health care provider about how to take care of your  radial site wound. Check the wound every day for signs of infection. Do not lift anything that is heavier than 10 lb (4.5 kg), or the limit that you are told, until your health care provider says that it is safe. This information is not intended to replace advice given to you by your health care provider. Make sure you discuss any questions you have with your health care provider. Document Revised: 01/13/2018 Document Reviewed: 01/13/2018 Elsevier Patient Education   2020 [employer]Femoral Site Care The following information offers guidance on how to care for yourself after your procedure. Your health care provider may also give you more specific instructions. If you have problems or questions, contact your health care provider. What can I expect after the procedure? After the procedure, it is common to have bruising and tenderness at the incision site. This usually fades within 1-2 weeks. Follow these instructions at home: Incision site care  Follow instructions from your health care provider about how to take care of your incision site. Make sure you: Wash your hands with soap and water  for at least 20 seconds before and after you change your bandage (dressing). If soap and water  are not available, use hand sanitizer. Remove your dressing in 24 hours. Leave stitches (sutures), skin glue, or adhesive strips in place. These skin closures may need to stay in place for 2 weeks or longer. If adhesive strip edges start to loosen and curl up, you may trim the loose edges. Do not remove adhesive strips completely unless your health care provider tells you to do that. Do not take baths, swim, or use a hot tub for at least 1 week. You may shower 24 hours after the procedure or as told by your health care provider. Gently wash the incision site with plain soap and water . Pat the area dry with a clean towel. Do not rub the site. This may cause bleeding. Do not apply powder or lotion to the site. Keep the site clean and dry. Check your femoral site every day for signs of infection. Check for: Redness, swelling, or pain. Fluid or blood. Warmth. Pus or a bad smell. Activity If you were given a sedative during the procedure, it can affect you for several hours. Do not drive or operate machinery until your health care provider says that it is safe. Rest as told by your health care provider. Avoid sitting for a long time without moving. Get up to take short walks  every 1-2 hours. This is important to improve blood flow and breathing. Ask for help if you feel weak or unsteady. Return to your normal activities as told by your health care provider. Ask your health care provider what activities are safe for you and when you can return to work. Avoid activities that take a lot of effort for the first 2-3 days after your procedure, or as long as directed. Do not lift anything that is heavier than 10 lb (4.5 kg), or the limit that you are told, until your health care provider says that it is safe. General instructions Take over-the-counter and prescription medicines only as told by your health care provider. If you will be going home right after the procedure, plan to have a responsible adult care for you for the time you are told. This is important. Keep all follow-up visits. This is important. Contact a health care provider if: You have a fever or chills. You have any of these signs of infection at your incision site: Redness,  swelling, or pain. Fluid or blood. Warmth. Pus or a bad smell. Get help right away if: The incision area swells very fast. The incision area is bleeding, and the bleeding does not stop when you hold steady pressure on the area. Your leg or foot becomes pale, cool, tingly, or numb. These symptoms may represent a serious problem that is an emergency. Do not wait to see if the symptoms will go away. Get medical help right away. Call your local emergency services (911 in the [place] Do not drive yourself to the hospital. Summary After the procedure, it is common to have bruising and tenderness that fade within 1-2 weeks. Check your femoral site every day for signs of infection. Do not lift anything that is heavier than 10 lb (4.5 kg), or the limit that you are told, until your health care provider says that it is safe. Get help right away if the incision area swells very fast, you have bleeding at the incision area that does not stop, or  your leg or foot becomes pale, cool, or numb. This information is not intended to replace advice given to you by your health care provider. Make sure you discuss any questions you have with your health care provider. Document Revised: 08/28/2021 Document Reviewed: 01/27/2021 Elsevier Patient Education  2024 [employer].

## 2024-09-07 NOTE — Interval H&P Note (Signed)
 History and Physical Interval Note:  09/07/2024 1:13 PM  Randy Pittman  has presented today for surgery, with the diagnosis of heart failure and coronary artery disease.  The various methods of treatment have been discussed with the patient and family. After consideration of risks, benefits and other options for treatment, the patient has consented to  Procedure(s): RIGHT AND LEFT HEART CATH (N/A) as a surgical intervention.  The patient's history has been reviewed, patient examined, no change in status, stable for surgery.  I have reviewed the patient's chart and labs.  Questions were answered to the patient's satisfaction.   Cath Lab Visit (complete for each Cath Lab visit)  Clinical Evaluation Leading to the Procedure:   ACS: No.  Non-ACS:    Anginal Classification: CCS I  Anti-ischemic medical therapy: No Therapy  Non-Invasive Test Results: No non-invasive testing performed  Prior CABG: No previous CABG        [facility] 09/07/2024 1:13 PM

## 2024-09-08 ENCOUNTER — Encounter (HOSPITAL_COMMUNITY): Payer: Self-pay | Admitting: Cardiology

## 2024-09-15 ENCOUNTER — Ambulatory Visit (HOSPITAL_BASED_OUTPATIENT_CLINIC_OR_DEPARTMENT_OTHER): Admitting: Cardiology

## 2024-09-15 DIAGNOSIS — H2513 Age-related nuclear cataract, bilateral: Secondary | ICD-10-CM | POA: Diagnosis not present

## 2024-09-15 DIAGNOSIS — H5213 Myopia, bilateral: Secondary | ICD-10-CM | POA: Diagnosis not present

## 2024-09-19 ENCOUNTER — Encounter: Payer: Self-pay | Admitting: Internal Medicine

## 2024-09-22 ENCOUNTER — Ambulatory Visit

## 2024-09-22 ENCOUNTER — Encounter: Payer: Self-pay | Admitting: Family Medicine

## 2024-09-22 ENCOUNTER — Other Ambulatory Visit: Payer: Self-pay

## 2024-09-22 VITALS — Ht 74.0 in | Wt 210.0 lb

## 2024-09-22 DIAGNOSIS — I2510 Atherosclerotic heart disease of native coronary artery without angina pectoris: Secondary | ICD-10-CM

## 2024-09-22 DIAGNOSIS — Z0000 Encounter for general adult medical examination without abnormal findings: Secondary | ICD-10-CM

## 2024-09-22 MED ORDER — NITROGLYCERIN 0.4 MG SL SUBL: 0.4000 mg | SUBLINGUAL_TABLET | SUBLINGUAL | 0 refills | Status: AC | PRN

## 2024-09-22 MED ORDER — EZETIMIBE 10 MG PO TABS: 10.0000 mg | ORAL_TABLET | Freq: Every day | ORAL | 0 refills | Status: DC

## 2024-09-22 MED ORDER — ATORVASTATIN CALCIUM 80 MG PO TABS: ORAL_TABLET | ORAL | 0 refills | Status: DC

## 2024-09-22 NOTE — Patient Instructions (Signed)
 [name],  Thank you for taking the time for your Medicare Wellness Visit. I appreciate your continued commitment to your health goals. Please review the care plan we discussed, and feel free to reach out if I can assist you further.  Medicare recommends these wellness visits once per year to help you and your care team stay ahead of potential health issues. These visits are designed to focus on prevention, allowing your provider to concentrate on managing your acute and chronic conditions during your regular appointments.  Please note that Annual Wellness Visits do not include a physical exam. Some assessments may be limited, especially if the visit was conducted virtually. If needed, we may recommend a separate in-person follow-up with your provider.  Ongoing Care Seeing your primary care provider every 3 to 6 months helps us  monitor your health and provide consistent, personalized care.   Referrals If a referral was made during today's visit and you haven't received any updates within two weeks, please contact the referred provider directly to check on the status.  Recommended Screenings:  Health Maintenance  Topic Date Due   Flu Shot  07/22/2024   COVID-19 Vaccine (6 - 2025-26 season) 08/22/2024   Medicare Annual Wellness Visit  09/22/2025   Cologuard (Stool DNA test)  09/22/2025   DTaP/Tdap/Td vaccine (8 - Td or Tdap) 07/10/2029   Pneumococcal Vaccine for age over 26  Completed   Hepatitis C Screening  Completed   Zoster (Shingles) Vaccine  Completed   HPV Vaccine  Aged Out   Meningitis B Vaccine  Aged Out   Colon Cancer Screening  Discontinued       09/07/2024    1:08 PM  Advanced Directives  Does Patient Have a Medical Advance Directive? Yes  Type of Estate agent of [place];Living will   Advance Care Planning is important because it: Ensures you receive medical care that aligns with your values, goals, and preferences. Provides guidance to your  family and loved ones, reducing the emotional burden of decision-making during critical moments.  Vision: Annual vision screenings are recommended for early detection of glaucoma, cataracts, and diabetic retinopathy. These exams can also reveal signs of chronic conditions such as diabetes and high blood pressure.  Dental: Annual dental screenings help detect early signs of oral cancer, gum disease, and other conditions linked to overall health, including heart disease and diabetes.  Please see the attached documents for additional preventive care recommendations.

## 2024-09-22 NOTE — Progress Notes (Signed)
 Please attest and cosign this visit due to patients primary care provider not being in the office at the time the visit was completed.    Subjective:   Randy Pittman is a 73 y.o. who presents for a Medicare Wellness preventive visit.  As a reminder, Annual Wellness Visits don't include a physical exam, and some assessments may be limited, especially if this visit is performed virtually. We may recommend an in-person follow-up visit with your provider if needed.  Visit Complete: Virtual I connected with  Randy Pittman on 09/22/24 by a audio enabled telemedicine application and verified that I am speaking with the correct person using two identifiers.  Patient Location: Home  Provider Location: Office/Clinic  I discussed the limitations of evaluation and management by telemedicine. The patient expressed understanding and agreed to proceed.  Vital Signs: Because this visit was a virtual/telehealth visit, some criteria may be missing or patient reported. Any vitals not documented were not able to be obtained and vitals that have been documented are patient reported.  VideoDeclined- This patient declined Librarian, academic. Therefore the visit was completed with audio only.  Persons Participating in Visit: Patient.  AWV Questionnaire: No: Patient Medicare AWV questionnaire was not completed prior to this visit.  Cardiac Risk Factors include: advanced age (>23men, >21 women);dyslipidemia;male gender;hypertension     Objective:    Today's Vitals   09/22/24 1059 09/22/24 1101  Weight: 210 lb (95.3 kg)   Height: 6' 2 (1.88 m)   PainSc:  0-No pain   Body mass index is 26.96 kg/m.     09/22/2024   11:08 AM 09/07/2024    1:08 PM 08/28/2020    2:41 PM 01/01/2017    2:15 PM 01/01/2017    7:34 AM  Advanced Directives  Does Patient Have a Medical Advance Directive? Yes Yes Yes Yes  Yes   Type of Estate agent of [place];Living will  Healthcare Power of [place];Living will Healthcare Power of [place];Living will Living will   Does patient want to make changes to medical advance directive?    No - Patient declined    Copy of Healthcare Power of Attorney in Chart? No - copy requested  No - copy requested       Data saved with a previous flowsheet row definition    Current Medications (verified) Outpatient Encounter Medications as of 09/22/2024  Medication Sig   aspirin  81 MG tablet Take 81 mg by mouth daily.   atorvastatin  (LIPITOR ) 80 MG tablet TAKE 1 TABLET DAILY AT 6 P.M.   ezetimibe  (ZETIA ) 10 MG tablet Take 1 tablet (10 mg total) by mouth daily.   nitroGLYCERIN  (NITROSTAT ) 0.4 MG SL tablet Place 1 tablet (0.4 mg total) under the tongue every 5 (five) minutes as needed for chest pain.   sacubitril -valsartan  (ENTRESTO ) 24-26 MG Take 1 tablet by mouth 2 (two) times daily.   No facility-administered encounter medications on file as of 09/22/2024.    Allergies (verified) Patient has no known allergies.   History: Past Medical History:  Diagnosis Date   Aortic atherosclerosis    chest CT 05/2021   Ascending aorta dilatation    43mm on Chest CTA 05/2021   CAD (coronary artery disease)    Hyperlipidemia 07/1995   Hypertension 12/22/2008   Low testosterone     per Dr. Toribio, with urology   Past Surgical History:  Procedure Laterality Date   CARDIAC CATHETERIZATION N/A 01/01/2017   Procedure: Left Heart Cath and Coronary  Angiography;  Surgeon: Debby DELENA Sor, MD;  [facility] INVAS[facility] CV LAB;  Service: Cardiovascular;  Laterality: N/A;   CARDIAC CATHETERIZATION N/A 01/01/2017   Procedure: Coronary Stent Intervention;  Surgeon: Debby DELENA Sor, MD;  Location: [facility] INVASIVE CV LAB;  Service: Cardiovascular;  Laterality: N/A;   COLONOSCOPY     CORONARY ANGIOPLASTY WITH STENT PLACEMENT  01/01/2017   RIGHT AND LEFT HEART CATH N/A 09/07/2024   Procedure: RIGHT AND LEFT HEART CATH;  Surgeon: Swaziland, Peter M, MD;  Location:  [facility] INVASIVE CV LAB;  Service: Cardiovascular;  Laterality: N/A;   Family History  Problem Relation Age of Onset   Diabetes Mother        kidney removal   Hypertension Mother    Cirrhosis Mother    Kidney disease Mother    Parkinsonism Father    Heart disease Brother        CABG, long time smoker, now quit, pacer/defib placement   Colon polyps Brother    Prostate cancer Other        great uncle   Colon cancer Other        great aunt   Social History   Socioeconomic History   Marital status: Married    Spouse name: Not on file   Number of children: Not on file   Years of education: Not on file   Highest education level: Associate degree: academic program  Occupational History   Occupation: Scientific laboratory technician [employer]  Tobacco Use   Smoking status: Former    Current packs/day: 0.00    Average packs/day: 2.0 packs/day for 5.0 years (10.0 ttl pk-yrs)    Types: Cigarettes    Start date: 37    Quit date: 1980    Years since quitting: 45.7   Smokeless tobacco: Former    Types: Chew    Quit date: 1975   Tobacco comments:    Quit 45 years ago  Vaping Use   Vaping status: Never Used  Substance and Sexual Activity   Alcohol use: Yes    Alcohol/week: 4.0 standard drinks of alcohol    Types: 2 Glasses of wine, 2 Cans of beer per week    Comment: 2-3 weekly   Drug use: No   Sexual activity: Not Currently  Other Topics Concern   Not on file  Social History Narrative   Lives with wife, daughter is in [place]  (she works in [place])   Married 1980   Retired from [employer], retired 12/2022.     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, E6   Social Drivers of Corporate investment banker Strain: Low Risk  (09/22/2024)   Overall Financial Resource Strain (CARDIA)    Difficulty of Paying Living Expenses: Not hard at all  Food Insecurity: No Food Insecurity (09/22/2024)   Hunger Vital Sign    Worried About Running Out of Food in the Last Year: Never true    Ran Out of Food in the Last Year:  Never true  Transportation Needs: No Transportation Needs (09/22/2024)   PRAPARE - Administrator, Civil Service (Medical): No    Lack of Transportation (Non-Medical): No  Physical Activity: Sufficiently Active (09/22/2024)   Exercise Vital Sign    Days of Exercise per Week: 4 days    Minutes of Exercise per Session: 40 min  Stress: No Stress Concern Present (09/22/2024)   [employer] of Occupational Health - Occupational Stress Questionnaire    Feeling of Stress: Not at all  Social Connections: Moderately Integrated (09/22/2024)   Social Connection and Isolation Panel    Frequency of Communication with Friends and Family: Once a week    Frequency of Social Gatherings with Friends and Family: Once a week    Attends Religious Services: More than 4 times per year    Active Member of [employer] or Organizations: Yes    Attends Engineer, structural: More than 4 times per year    Marital Status: Married    Tobacco Counseling Counseling given: Not Answered Tobacco comments: Quit 45 years ago    Clinical Intake:  Pre-visit preparation completed: Yes  Pain : No/denies pain Pain Score: 0-No pain     BMI - recorded: 26.96 Nutritional Status: BMI 25 -29 Overweight Nutritional Risks: None Diabetes: No  No results found for: HGBA1C   How often do you need to have someone help you when you read instructions, pamphlets, or other written materials from your doctor or pharmacy?: 1 - Never  Interpreter Needed?: No  Comments: lives with wife Information entered by :: B.Tomoki Lucken,LPN   Activities of Daily Living     09/22/2024   11:08 AM 09/21/2024    7:25 PM  In your present state of health, do you have any difficulty performing the following activities:  Hearing? 0 0  Vision? 0 0  Difficulty concentrating or making decisions? 0 1  Walking or climbing stairs? 0 0  Dressing or bathing? 0 0  Doing errands, shopping? 0 0  Preparing Food and eating ? N N   Using the Toilet? N N  In the past six months, have you accidently leaked urine? N N  Do you have problems with loss of bowel control? N   Managing your Medications? N N  Managing your Finances? N N  Housekeeping or managing your Housekeeping? N N    Patient Care Team: Cleatus Arlyss RAMAN, MD as PCP - General (Family Medicine) Maranda Leim DEL, MD as PCP - Cardiology (Cardiology) Pa, [facility]  I have updated your Care Teams any recent Medical Services you may have received from other providers in the past year.     Assessment:   This is a routine wellness examination for Randy Pittman.  Hearing/Vision screen Hearing Screening - Comments:: Patient denies any hearing difficulties.   Vision Screening - Comments:: Pt says their vision is good with glasses [facility]   Goals Addressed             This Visit's Progress    Patient Stated   On track    09/22/24 I will continue to walk about 7,000 steps a day.        Depression Screen     09/22/2024   11:06 AM 05/06/2024    2:31 PM 09/21/2023   10:44 AM 09/16/2022   11:56 AM 09/12/2021    9:28 AM 08/28/2020    2:43 PM 08/23/2019    3:29 PM  PHQ 2/9 Scores  PHQ - 2 Score 0 0 0 0 0 0 0  PHQ- 9 Score  0 0   0     Fall Risk     09/22/2024   11:02 AM 09/21/2024    7:25 PM 05/06/2024    2:31 PM 09/21/2023   10:44 AM 09/16/2022   11:56 AM  Fall Risk   Falls in the past year? 0 0 0 0 0  Number falls in past yr: 0  0 0 0  Injury with Fall? 0  0 0  0  Risk for fall due to : No Fall Risks  No Fall Risks No Fall Risks No Fall Risks  Follow up Falls prevention discussed;Education provided  Falls evaluation completed Falls evaluation completed Falls evaluation completed      Data saved with a previous flowsheet row definition    MEDICARE RISK AT HOME:  Medicare Risk at Home Any stairs in or around the home?: Yes If so, are there any without handrails?: Yes Home free of loose throw rugs in walkways, pet beds,  electrical cords, etc?: Yes Adequate lighting in your home to reduce risk of falls?: Yes Life alert?: No Use of a cane, walker or w/c?: No Grab bars in the bathroom?: No Shower chair or bench in shower?: No Elevated toilet seat or a handicapped toilet?: Yes  TIMED UP AND GO:  Was the test performed?  No  Cognitive Function: 6CIT completed    08/28/2020    2:44 PM  MMSE - Mini Mental State Exam  Orientation to time 5  Orientation to Place 5  Registration 3  Attention/ Calculation 5  Recall 3  Language- repeat 1        09/22/2024   11:09 AM  6CIT Screen  What Year? 0 points  What month? 0 points  What time? 0 points  Count back from 20 0 points  Months in reverse 0 points  Repeat phrase 0 points  Total Score 0 points    Immunizations Immunization History  Administered Date(s) Administered   Dtap, Unspecified 08/14/1953, 09/18/1953, 10/23/1953, 02/10/1958   Fluad Quad(high Dose 65+) 08/30/2020, 09/12/2021, 09/16/2022   Influenza Whole 11/04/2004   Influenza, Seasonal, Injecte, Preservative Fre 10/05/2016, 09/21/2023   Influenza-Unspecified 09/24/2017, 09/26/2019   PFIZER Comirnaty(Gray Top)Covid-19 Tri-Sucrose Vaccine 05/28/2021, 10/16/2021   PFIZER(Purple Top)SARS-COV-2 Vaccination 01/24/2020, 02/14/2020, 10/05/2020   PNEUMOCOCCAL CONJUGATE-20 10/16/2023   Pneumococcal Polysaccharide-23 08/20/2018   Polio, Unspecified 02/24/1956, 03/23/1956, 10/26/1956, 04/27/1959   Smallpox 06/08/1959   Td 02/19/2001, 07/11/2019   Tdap 11/29/2010   Zoster Recombinant(Shingrix) 09/21/2018, 02/03/2019   Zoster, Live 10/07/2013    Screening Tests Health Maintenance  Topic Date Due   Influenza Vaccine  07/22/2024   COVID-19 Vaccine (6 - 2025-26 season) 08/22/2024   Medicare Annual Wellness (AWV)  09/22/2025   Fecal DNA (Cologuard)  09/22/2025   DTaP/Tdap/Td (8 - Td or Tdap) 07/10/2029   Pneumococcal Vaccine: 50+ Years  Completed   Hepatitis C Screening  Completed   Zoster  Vaccines- Shingrix  Completed   HPV VACCINES  Aged Out   Meningococcal B Vaccine  Aged Out   Colonoscopy  Discontinued    Health Maintenance Items Addressed: Pt says he will get hi Flu vaccine in office next week.   Additional Screening:  Vision Screening: Recommended annual ophthalmology exams for early detection of glaucoma and other disorders of the eye. Is the patient up to date with their annual eye exam?  Yes  Who is the provider or what is the name of the office in which the patient attends annual eye exams? Doe snot know: at [facility]  Dental Screening: Recommended annual dental exams for proper oral hygiene  Community Resource Referral / Chronic Care Management: CRR required this visit?  No   CCM required this visit?  No   Plan:    I have personally reviewed and noted the following in the patient's chart:   Medical and social history Use of alcohol, tobacco or illicit drugs  Current medications and supplements including opioid prescriptions. Patient is  not currently taking opioid prescriptions. Functional ability and status Nutritional status Physical activity Advanced directives List of other physicians Hospitalizations, surgeries, and ER visits in previous 12 months Vitals Screenings to include cognitive, depression, and falls Referrals and appointments  In addition, I have reviewed and discussed with patient certain preventive protocols, quality metrics, and best practice recommendations. A written personalized care plan for preventive services as well as general preventive health recommendations were provided to patient.   Randy LITTIE Saris, LPN   [id number]/06/[id number]   After Visit Summary: (MyChart) Due to this being a telephonic visit, the after visit summary with patients personalized plan was offered to patient via MyChart   Notes: Nothing significant to report at this time.

## 2024-09-29 ENCOUNTER — Ambulatory Visit (INDEPENDENT_AMBULATORY_CARE_PROVIDER_SITE_OTHER): Admitting: Family Medicine

## 2024-09-29 ENCOUNTER — Encounter: Payer: Self-pay | Admitting: Family Medicine

## 2024-09-29 VITALS — BP 110/78 | HR 83 | Temp 98.2°F | Ht 72.21 in | Wt 211.2 lb

## 2024-09-29 DIAGNOSIS — I10 Essential (primary) hypertension: Secondary | ICD-10-CM | POA: Diagnosis not present

## 2024-09-29 DIAGNOSIS — R0602 Shortness of breath: Secondary | ICD-10-CM | POA: Diagnosis not present

## 2024-09-29 DIAGNOSIS — R413 Other amnesia: Secondary | ICD-10-CM

## 2024-09-29 DIAGNOSIS — E782 Mixed hyperlipidemia: Secondary | ICD-10-CM | POA: Diagnosis not present

## 2024-09-29 DIAGNOSIS — Z23 Encounter for immunization: Secondary | ICD-10-CM

## 2024-09-29 DIAGNOSIS — I2510 Atherosclerotic heart disease of native coronary artery without angina pectoris: Secondary | ICD-10-CM

## 2024-09-29 DIAGNOSIS — Z125 Encounter for screening for malignant neoplasm of prostate: Secondary | ICD-10-CM | POA: Diagnosis not present

## 2024-09-29 DIAGNOSIS — R232 Flushing: Secondary | ICD-10-CM | POA: Diagnosis not present

## 2024-09-29 DIAGNOSIS — Z7189 Other specified counseling: Secondary | ICD-10-CM

## 2024-09-29 DIAGNOSIS — Z0000 Encounter for general adult medical examination without abnormal findings: Secondary | ICD-10-CM

## 2024-09-29 LAB — LIPID PANEL
Cholesterol: 105 mg/dL (ref 0–200)
HDL: 49 mg/dL (ref >39.00)
LDL Cholesterol: 44 mg/dL (ref 0–99)
NonHDL: 55.59
Total CHOL/HDL Ratio: 2
Triglycerides: 60 mg/dL (ref 0.0–149.0)
VLDL: 12 mg/dL (ref 0.0–40.0)

## 2024-09-29 LAB — HEPATIC FUNCTION PANEL
ALT: 18 U/L (ref 0–53)
AST: 15 U/L (ref 0–37)
Albumin: 4.4 g/dL (ref 3.5–5.2)
Alkaline Phosphatase: 57 U/L (ref 39–117)
Bilirubin, Direct: 0.2 mg/dL (ref 0.0–0.3)
Total Bilirubin: 0.8 mg/dL (ref 0.2–1.2)
Total Protein: 6.4 g/dL (ref 6.0–8.3)

## 2024-09-29 LAB — PSA, MEDICARE: PSA: 3.57 ng/mL (ref 0.10–4.00)

## 2024-09-29 LAB — VITAMIN B12: Vitamin B-12: 330 pg/mL (ref 211–911)

## 2024-09-29 MED ORDER — ATORVASTATIN CALCIUM 80 MG PO TABS: ORAL_TABLET | ORAL | 3 refills | Status: AC

## 2024-09-29 MED ORDER — EZETIMIBE 10 MG PO TABS: 10.0000 mg | ORAL_TABLET | Freq: Every day | ORAL | 3 refills | Status: AC

## 2024-09-29 NOTE — Patient Instructions (Addendum)
 Flu shot today.  Go to the lab on the way out.   If you have mychart we'll likely use that to update you.    Take care.  Glad to see you.  Let me know if you can't get set up for the head scan and the pulmonary appointment.

## 2024-09-29 NOTE — Progress Notes (Signed)
 Flu 2025 Shingles previously done PNA previously done Tetanus 2020 COVID-vaccine previously done Cologuard neg 2023 Prostate cancer screening 2025 Advance directive-wife designated if patient were incapacitated.  D/w pt about:  SOB.  He had reassuring cath with the assumption that his sx are not cardiac related.  Flushing- unclear source.   Memory/attention changes.  He noted some short term changes.  Slower recall.  Normal long term testing.  No red flag events.  30/30 MMSE today.   D/w pt about anxiety contributing to SOB/flushing.   D/w pt about pulmonary eval.   Hypertension:    Using medication without problems or lightheadedness: yes Chest pain with exertion:no Edema:no Short of breath: see above.  He doesn't have exertional SOB but sometimes has SOB at rest.    Elevated Cholesterol: Using medications without problems:yes Muscle aches: no Diet compliance: d/w pt.  Exercise: d/w pt.    D/w pt about prev cath:   ===========================     2nd Mrg-1 lesion is 70% stenosed.   Mid RCA lesion is 65% stenosed.   Previously placed 2nd Mrg-2 stent of unknown type is  widely patent.   LV end diastolic pressure is normal.   Continued excellent patency of the stent in OM2. There is modest disease in the OM proximal to the stent. There is a moderate stenosis in the mid RCA with competitive filling of the PL via left to right collaterals.  Normal LV filling pressures. LVEDP 8 mm Hg, PCWP 9/5, mean 5 mm Hg Normal right heart pressures. PAP 19/9, mean 14 mm Hg Normal cardiac output. 9 L/min with index 4.1.    Plan: the old stent is widely patent. The lesion in the mid RCA is really unchanged from 2018 but collateral filling distally has improved. Recommend medical management.  ===========================  Meds, vitals, and allergies reviewed.   ROS: Per HPI unless specifically indicated in ROS section   GEN: nad, alert and oriented HEENT: mucous membranes moist NECK:  supple w/o LA CV: rrr.  PULM: ctab, no inc wob ABD: soft, +bs EXT: no edema SKIN: Well-perfused.

## 2024-10-02 ENCOUNTER — Other Ambulatory Visit: Payer: Self-pay | Admitting: Family Medicine

## 2024-10-02 ENCOUNTER — Ambulatory Visit: Payer: Self-pay | Admitting: Family Medicine

## 2024-10-02 MED ORDER — VITAMIN B-12 1000 MCG PO TABS: 1000.0000 ug | ORAL_TABLET | Freq: Every day | ORAL | Status: AC

## 2024-10-02 NOTE — Assessment & Plan Note (Signed)
 Will refer him to pulmonary to exclude a cause there for his shortness of breath.  At this point still okay for outpatient follow-up.

## 2024-10-02 NOTE — Assessment & Plan Note (Signed)
 Continue aspirin  Lipitor  Zetia  and Entresto .

## 2024-10-02 NOTE — Assessment & Plan Note (Signed)
 Of unclear source.  Would observe for now.

## 2024-10-02 NOTE — Assessment & Plan Note (Signed)
 Flu 2025 Shingles previously done PNA previously done Tetanus 2020 COVID-vaccine previously done Cologuard neg 2023 Prostate cancer screening 2025 Advance directive-wife designated if patient were incapacitated.

## 2024-10-02 NOTE — Assessment & Plan Note (Signed)
 Advance directive- wife designated if patient were incapacitated.

## 2024-10-02 NOTE — Assessment & Plan Note (Signed)
 Continue atorvastatin and Zetia

## 2024-10-02 NOTE — Assessment & Plan Note (Signed)
 See notes on follow-up labs.  30 out of 30 MMSE today.  CT head ordered.  Okay for outpatient follow-up.

## 2024-10-04 ENCOUNTER — Ambulatory Visit
Admission: RE | Admit: 2024-10-04 | Discharge: 2024-10-04 | Disposition: A | Discharge status: Home / Self Care | Source: Ambulatory Visit | Attending: Family Medicine | Admitting: Family Medicine

## 2024-10-04 DIAGNOSIS — R413 Other amnesia: Secondary | ICD-10-CM | POA: Diagnosis not present

## 2024-10-11 ENCOUNTER — Ambulatory Visit (INDEPENDENT_AMBULATORY_CARE_PROVIDER_SITE_OTHER): Admitting: Internal Medicine

## 2024-10-11 ENCOUNTER — Telehealth: Payer: Self-pay | Admitting: Internal Medicine

## 2024-10-11 ENCOUNTER — Encounter: Payer: Self-pay | Admitting: Internal Medicine

## 2024-10-11 VITALS — BP 124/82 | HR 81 | Temp 98.3°F | Ht 74.0 in | Wt 213.2 lb

## 2024-10-11 DIAGNOSIS — R0602 Shortness of breath: Secondary | ICD-10-CM

## 2024-10-11 LAB — NITRIC OXIDE: Nitric Oxide: 23

## 2024-10-11 NOTE — Telephone Encounter (Signed)
 Randy Pittman  TY  Leslie  Pls let patient know that Dr Shirlene approved for him get the CPST. Please make sure the order say with EIB challenge  Thanks    SIGNATURE    Dr. Dorethia Cave, M.D., F.C.C.P,  Pulmonary and Critical Care Medicine Staff Physician, [facility] Center Director - Interstitial Lung Disease  Program  Pulmonary Fibrosis [facility] at [facility] [place], [place], [address]   Pager: [phone], If no answer  -> Check AMION or Try [phone] Telephone (clinical office): [phone] Telephone (research): [phone]  10:00 PM 10/11/2024

## 2024-10-11 NOTE — Telephone Encounter (Signed)
 Randy Pittman  *I am unable to find a reason for the shortness of breath outside low systolic ejection fraction.  I am considering doing a pulmonary stress test with exercise bronchospasm challenge.  Do you see any contraindications?   Thanks    SIGNATURE    Dr. Dorethia Cave, M.D., F.C.C.P,  Pulmonary and Critical Care Medicine Staff Physician, [facility] Center Director - Interstitial Lung Disease  Program  Pulmonary Fibrosis [facility] at [facility] [place], [place], [address]   Pager: [phone], If no answer  -> Check AMION or Try [phone] Telephone (clinical office): [phone] Telephone (research): [phone]  1:57 PM 10/11/2024

## 2024-10-11 NOTE — Progress Notes (Signed)
 OV 10/11/2024  Subjective:  Patient ID: Randy Pittman, male , DOB: [birth year] , age 73 y.o. , MRN: [id number] , ADDRESS: [id number] [place] [id number] PCP Cleatus Arlyss RAMAN, MD Patient Care Team: Cleatus Arlyss RAMAN, MD as PCP - General (Family Medicine) Maranda Leim DEL, MD as PCP - Cardiology (Cardiology) Pa, [facility] Geronimo Amel, MD as Consulting Physician (Pulmonary Disease)  This Provider for this visit: Treatment Team:  Attending Provider: Jude Harden GAILS, MD    10/11/2024 -   Chief Complaint  Patient presents with   Consult    Pt states he has been having SOB, states SOB been around for about 6 months  SOB mostly occurs when pt is resting or in bed he stated SOB never really occurs when walking      HPI Randy Pittman 73 y.o. -*referred by Dr. Arnetha and also by primary care Dr. Arlyss Cleatus.  He is here with his wife who is an independent historian.  Both of them attest that he has had insidious onset of shortness of breath for the last few to several months.  More on the several-month side.  He feels dyspneic like he is not able to get a good deep breath and enough air.  It is worse when he is taking a rest.  He denies it is worse with exertion but then he also told me that when he does yard work he does get quite winded.  Is also worse when he is on his left lateral side.  It is diminished generally with normal exertion.  It is new it is stable it is moderate in intensity.  He denies any associated cough.  No wheezing no chest pain no orthopnea no paroxysmal nocturnal dyspnea no weight loss no fever no chills no snoring.  No fatigue.  No eyelid drooping the end of the day.  He had CT scan of the chest the lung fields are clear He had ejection fraction that was low on the echo and he is aware that he has chronic systolic dysfunction He had cardiac cath right heart cath results are normal Left heart cath showed  patent vessel. Diastolic function was normal on the cath. Blood hemoglobin is normal  His eosinophils have been on the higher side. - FENO 10/11/2024 23 pbb and normal Lab Results  Component Value Date   NITRICOXIDE 23 10/11/2024   This result suggests low (<25) Type 2 (T2) airway inflammation indicating a low likelihood of active T2-driven airway inflammation; reduced probability of response to inhaled corticosteroids.         SIT STAND TEST - goal 15 times   10/11/2024    O2 used ra   PRobe - finter or forehead forehead   Number sit and stand completed - goal 15 15   Time taken to complete 38 se   Resting Pulse Ox/HR/Dyspnea  100% and 86/min and dyspnea of 3/10    Peak measures 100 % and 103/min and dyspnea of 5/10   Final Pulse Ox/HR 99% and 85/min and dyspnea of 5/10   Desaturated </= 88% no   Desaturated <= 3% points no   Got Tachycardic >/= 90/min yes   Miscellaneous comments Out of proprotions dyspnea       CT Chest data from date: *6?10/25  - personally visualized and independently interpreted : yes - my findings are: clear lung fields Narrative & Impression  CLINICAL DATA:  Follow-up ascending thoracic aortic aneurysm.  EXAM: CT ANGIOGRAPHY CHEST WITH CONTRAST   TECHNIQUE: Multidetector CT imaging of the chest was performed using the standard protocol during bolus administration of intravenous contrast. Multiplanar CT image reconstructions and MIPs were obtained to evaluate the vascular anatomy.   RADIATION DOSE REDUCTION: This exam was performed according to the departmental dose-optimization program which includes automated exposure control, adjustment of the mA and/or kV according to patient size and/or use of iterative reconstruction technique.   CONTRAST:  OMNIPAQUE  IOHEXOL  350 MG/ML SOLN   COMPARISON:  05/29/2023   FINDINGS: Cardiovascular: The ascending thoracic aorta measures 4.3 cm in maximum diameter on image number 52/5,  unchanged. Calcific coronary artery and aortic atherosclerosis. Normal-sized heart. No pericardial effusion.   Mediastinum/Nodes: No enlarged mediastinal, hilar, or axillary lymph nodes. Thyroid  gland, trachea, and esophagus demonstrate no significant findings.   Lungs/Pleura: Lungs are clear. No pleural effusion or pneumothorax.   Upper Abdomen: Atheromatous arterial calcifications without aneurysm.   Musculoskeletal: Thoracic spine degenerative changes. Sternomanubrial degenerative changes.   Review of the MIP images confirms the above findings.   IMPRESSION: 1. 4.3 cm ascending thoracic aortic aneurysm, unchanged. Recommend follow-up CTA in 1 year. 2. Calcific coronary artery and aortic atherosclerosis.   Aortic Atherosclerosis (ICD10-I70.0).     Electronically Signed   By: Elspeth Bathe M.D.   On: 06/02/2024 10:34      PFT      No data to display         ECHO JUne 2025   IMPRESSIONS     1. No left ventricular thrombus is seen (Definity  contrast was used).  Left ventricular ejection fraction, by estimation, is 45 to 50%. The left  ventricle has mildly decreased function. The left ventricle demonstrates  regional wall motion abnormalities  (see scoring diagram/findings for description). Left ventricular diastolic  parameters are consistent with Grade I diastolic dysfunction (impaired  relaxation). There is mild hypokinesis of the left ventricular, basal-mid  inferior wall and inferolateral  wall.   2. Right ventricular systolic function is normal. The right ventricular  size is normal.   3. The mitral valve is normal in structure. No evidence of mitral valve  regurgitation. No evidence of mitral stenosis.   4. The aortic valve is tricuspid. Aortic valve regurgitation is mild. No  aortic stenosis is present.   5. Aortic dilatation noted. There is moderate dilatation of the aortic  root, measuring 46 mm. There is moderate dilatation of the ascending   aorta, measuring 45 mm. There is Severe (Grade IV) protruding plaque  involving the aortic arch.      LAB RESULTS last 96 hours No results found.   RIGHT AND LEFT HEART CATH Sept 2025     2nd Mrg-1 lesion is 70% stenosed.   Mid RCA lesion is 65% stenosed.   Previously placed 2nd Mrg-2 stent of unknown type is  widely patent.   LV end diastolic pressure is normal.   Continued excellent patency of the stent in OM2. There is modest disease in the OM proximal to the stent. There is a moderate stenosis in the mid RCA with competitive filling of the PL via left to right collaterals.  Normal LV filling pressures. LVEDP 8 mm Hg, PCWP 9/5, mean 5 mm Hg Normal right heart pressures. PAP 19/9, mean 14 mm Hg Normal cardiac output. 9 L/min with index 4.1.    Plan: the old stent is widely patent. The lesion in the mid RCA is really unchanged from 2018 but collateral filling distally  has improved. Recommend medical management.       Latest Reference Range & Units 06/10/12 12:03 12/05/16 14:56 02/08/18 12:57 08/20/18 11:07 08/23/19 16:16 08/22/20 07:47 03/22/21 15:32 05/06/24 15:18  Eosinophil % 3.2 2.5 2.5 5.4 (H) 4.2 5.6 (H) 5.1 3.5  Eosinophils Absolute 15 - 500 cells/uL 0.2 0.2 0.2 0.3 0.3 0.3 250 280  (H): Data is abnormally high   Latest Reference Range & Units 06/10/12 12:03 05/27/13 08:21 12/05/16 14:56 12/30/16 14:19 01/02/17 02:27 02/08/18 12:57 08/20/18 11:07 08/23/19 16:16 08/22/20 07:47 03/22/21 15:32 05/06/24 15:18 09/02/24 10:32 09/07/24 13:43 09/07/24 14:14 09/07/24 14:15  Hemoglobin 13.0 - 17.0 g/dL 84.8 84.6 84.7 85.6 86.4 15.4 15.2 14.7 14.2 15.1 15.9 15.9 14.3 14.3 14.3    has a past medical history of Aortic atherosclerosis, Ascending aorta dilatation, CAD (coronary artery disease), Hyperlipidemia (07/1995), Hypertension (12/22/2008), and Low testosterone .   reports that he quit smoking about 45 years ago. His smoking use included cigarettes. He started smoking about 50  years ago. He has a 10 pack-year smoking history. He quit smokeless tobacco use about 50 years ago.  His smokeless tobacco use included chew.  Past Surgical History:  Procedure Laterality Date   CARDIAC CATHETERIZATION N/A 01/01/2017   Procedure: Left Heart Cath and Coronary Angiography;  Surgeon: Debby DELENA Sor, MD;  Location: [facility] INVASIVE CV [facility];  Service: Cardiovascular;  Laterality: N/A;   CARDIAC CATHETERIZATION N/A 01/01/2017   Procedure: Coronary Stent Intervention;  Surgeon: Debby DELENA Sor, MD;  Location: [facility] INVASIVE CV [facility];  Service: Cardiovascular;  Laterality: N/A;   COLONOSCOPY     CORONARY ANGIOPLASTY WITH STENT PLACEMENT  01/01/2017   RIGHT AND LEFT HEART CATH N/A 09/07/2024   Procedure: RIGHT AND LEFT HEART CATH;  Surgeon: [place], Peter M, MD;  Location: [facility] INVASIVE CV LAB;  Service: Cardiovascular;  Laterality: N/A;    No Known Allergies  Immunization History  Administered Date(s) Administered   Dtap, Unspecified 08/14/1953, 09/18/1953, 10/23/1953, 02/10/1958   Fluad Quad(high Dose 65+) 08/30/2020, 09/12/2021, 09/16/2022   Influenza Whole 11/04/2004   Influenza, Seasonal, Injecte, Preservative Fre 10/05/2016, 09/21/2023, 09/29/2024   Influenza-Unspecified 09/24/2017, 09/26/2019   PFIZER Comirnaty(Gray Top)Covid-19 Tri-Sucrose Vaccine 05/28/2021, 10/16/2021   PFIZER(Purple Top)SARS-COV-2 Vaccination 01/24/2020, 02/14/2020, 10/05/2020   PNEUMOCOCCAL CONJUGATE-20 10/16/2023   Pneumococcal Polysaccharide-23 08/20/2018   Polio, Unspecified 02/24/1956, 03/23/1956, 10/26/1956, 04/27/1959   Smallpox 06/08/1959   Td 02/19/2001, 07/11/2019   Tdap 11/29/2010   Zoster Recombinant(Shingrix) 09/21/2018, 02/03/2019   Zoster, Live 10/07/2013    Family History  Problem Relation Age of Onset   Diabetes Mother        kidney removal   Hypertension Mother    Cirrhosis Mother    Kidney disease Mother    Parkinsonism Father    Heart disease Brother        CABG, long time smoker,  now quit, pacer/defib placement   Colon polyps Brother    Prostate cancer Other        great uncle   Colon cancer Other        great aunt     Current Outpatient Medications:    aspirin  81 MG tablet, Take 81 mg by mouth daily., Disp: , Rfl:    atorvastatin  (LIPITOR ) 80 MG tablet, TAKE 1 TABLET DAILY AT 6 P.M., Disp: 90 tablet, Rfl: 3   cyanocobalamin (VITAMIN B12) 1000 MCG tablet, Take 1 tablet (1,000 mcg total) by mouth daily., Disp: , Rfl:    ezetimibe  (ZETIA ) 10 MG tablet, Take 1 tablet (10  mg total) by mouth daily., Disp: 90 tablet, Rfl: 3   nitroGLYCERIN  (NITROSTAT ) 0.4 MG SL tablet, Place 1 tablet (0.4 mg total) under the tongue every 5 (five) minutes as needed for chest pain., Disp: 25 tablet, Rfl: 0   sacubitril -valsartan  (ENTRESTO ) 24-26 MG, Take 1 tablet by mouth 2 (two) times daily., Disp: 180 tablet, Rfl: 2      Objective:   Vitals:   10/11/24 1333  BP: 124/82  Pulse: 81  Temp: 98.3 F (36.8 C)  TempSrc: Oral  SpO2: 96%  Weight: 213 lb 3.2 oz (96.7 kg)  Height: 6' 2 (1.88 m)    Estimated body mass index is 27.37 kg/m as calculated from the following:   Height as of this encounter: 6' 2 (1.88 m).   Weight as of this encounter: 213 lb 3.2 oz (96.7 kg).  @WEIGHTCHANGE @  [employer]   10/11/24 1333  Weight: 213 lb 3.2 oz (96.7 kg)     Physical Exam   General: No distress. Looks well O2 at rest: no Cane present: no Sitting in wheel chair: no Frail: no Obese: no Neuro: Alert and Oriented x 3. GCS 15. Speech normal Psych: Pleasant Resp:  Barrel Chest - no.  Wheeze - no, Crackles - non, No overt respiratory distress CVS: Normal heart sounds. Murmurs - no Ext: Stigmata of Connective Tissue Disease - no HEENT: Normal upper airway. PEERL +. No post nasal drip        Assessment/     Assessment & Plan SOB (shortness of breath)    PLAN Patient Instructions     ICD-10-CM   1. SOB (shortness of breath)  R06.02 Nitric oxide     Unexplained  reason for shortness of breath but cardiac reasons weigh Symptoms are out of proportin  Plan  - do full PFT  - do CPST biket test with EIB challenge - do ono test on room air  Followup  - retrun to see APP in 6 weeks but after completing above  - if non=diagnostic, consider RAST allergy testing, AchRab etc.,     FOLLOWUP    Return for  - retrun to see APP in 6 weeks but after completing above.    SIGNATURE    Dr. Dorethia Cave, M.D., F.C.C.P,  Pulmonary and Critical Care Medicine Staff Physician, [facility] Director - Interstitial Lung Disease  Program  Pulmonary Fibrosis [facility] at [facility] [place], [place], [address]  Pager: [phone], If no answer or between  15:00h - 7:00h: call [phone] Telephone: [phone]  2:16 PM 10/11/2024

## 2024-10-11 NOTE — Patient Instructions (Addendum)
 ICD-10-CM   1. SOB (shortness of breath)  R06.02 Nitric oxide     Unexplained reason for shortness of breath but cardiac reasons weigh Symptoms are out of proportin  Plan  - do full PFT  - do CPST biket test with EIB challenge - do ono test on room air  Followup  - retrun to see APP in 6 weeks but after completing above  - if non=diagnostic, consider RAST allergy testing, AchRab etc.,

## 2024-10-12 NOTE — Telephone Encounter (Signed)
 I called and spoke with the pt and notified of the below per MR  Pt verbalized understanding.  He was already scheduled for CPST with EIB challenge  Nothing further needed

## 2024-10-18 NOTE — Progress Notes (Unsigned)
 Cardiology Clinic Note   Patient Name: Randy Pittman Date of Encounter: 10/20/2024  Primary Care Provider:  Cleatus Arlyss RAMAN, MD Primary Cardiologist:  [name], MD  Patient Profile    Randy Pittman 73 year old male presents to the clinic today for follow-up evaluation of his coronary artery disease.  Past Medical History    Past Medical History:  Diagnosis Date   Aortic atherosclerosis    chest CT 05/2021   Ascending aorta dilatation    43mm on Chest CTA 05/2021   CAD (coronary artery disease)    Hyperlipidemia 07/1995   Hypertension 12/22/2008   Low testosterone     per Dr. Toribio, with urology   Past Surgical History:  Procedure Laterality Date   CARDIAC CATHETERIZATION N/A 01/01/2017   Procedure: Left Heart Cath and Coronary Angiography;  Surgeon: Debby DELENA Sor, MD;  Location: [facility] INVASIVE CV LAB;  Service: Cardiovascular;  Laterality: N/A;   CARDIAC CATHETERIZATION N/A 01/01/2017   Procedure: Coronary Stent Intervention;  Surgeon: Debby DELENA Sor, MD;  Location: [facility] INVASIVE CV LAB;  Service: Cardiovascular;  Laterality: N/A;   COLONOSCOPY     CORONARY ANGIOPLASTY WITH STENT PLACEMENT  01/01/2017   RIGHT AND LEFT HEART CATH N/A 09/07/2024   Procedure: RIGHT AND LEFT HEART CATH;  Surgeon: Jordan, Peter M, MD;  Location: [facility] INVASIVE CV LAB;  Service: Cardiovascular;  Laterality: N/A;    Allergies  No Known Allergies  History of Present Illness    Randy Pittman has a PMH of ischemic cardiomyopathy, chronic systolic CHF, coronary artery disease, hyperlipidemia, and shortness of breath.  He was seen in follow-up by Dr. Santo on 09/02/2024.  During that time he reported persistent shortness of breath.  His previous stenting in 2018 was reviewed.  Earlier in the year he had establish care with cardiology and was started on GDMT for heart failure.  His medications were held due to hypotension.  He was unable to tolerate SGLT2 inhibitor.  He did not notice  difference while on the medication.  He described persistent shortness of breath with more increased work of breathing during episodes of exertion.  He did not feel that he was out of breath or gasping for air.  He described as not getting good deep breaths.  He did experience occasional dizziness which he attributed to standing up quickly.  His weight had remained stable.  His heart rate was slightly increased but was below 90 bpm.  His blood pressure was stable.  He noted having no symptoms before his RCA stent in 2018.  He also did not notice a difference in his symptoms post procedure.  He would occasionally feel a twinge sensation in his chest.  He denied this during his visit.  His blood pressure was noted to be 127/78.  He underwent right and left heart cath on 09/07/2024.  He was noted to have second marginal 70% stenosis, mid RCA 65% stenosis, previously stented second marginal widely patent, LVED pressure was normal.  He was noted to have filling of the PL via left-to-right collaterals.  Cardiac output 9 L/min with an index of 4.1.  His mid RCA lesion was unchanged from 2018.  Medical management was recommended.  He presents to the clinic today for follow-up evaluation and states he continues to note shortness of breath.  He is following with Dr. Geronimo as well.  We reviewed his recent cardiac catheterization and echocardiogram.  His blood pressure today is 110/68.  His pulses 89.  We also reviewed his lipid panel from 09/29/2024.  His cholesterol is well-controlled.  He notes that he has PFTs scheduled for 11/24/2024.  I will continue his current medication regimen and plan follow-up in 4 months..  Today he denies chest pain, increased shortness of breath, lower extremity edema, fatigue, palpitations, melena, hematuria, hemoptysis, diaphoresis, weakness, presyncope, syncope, orthopnea, and PND.   Home Medications    Prior to Admission medications   Medication Sig Start Date End Date  Taking? Authorizing Provider  aspirin  81 MG tablet Take 81 mg by mouth daily.    [provider]  atorvastatin  (LIPITOR ) 80 MG tablet TAKE 1 TABLET DAILY AT 6 P.M. 09/29/24   Cleatus Arlyss RAMAN, MD  cyanocobalamin (VITAMIN B12) 1000 MCG tablet Take 1 tablet (1,000 mcg total) by mouth daily. 10/02/24   Cleatus Arlyss RAMAN, MD  ezetimibe  (ZETIA ) 10 MG tablet Take 1 tablet (10 mg total) by mouth daily. 09/29/24   Cleatus Arlyss RAMAN, MD  nitroGLYCERIN  (NITROSTAT ) 0.4 MG SL tablet Place 1 tablet (0.4 mg total) under the tongue every 5 (five) minutes as needed for chest pain. 09/22/24   Cleatus Arlyss RAMAN, MD  sacubitril -valsartan  (ENTRESTO ) 24-26 MG Take 1 tablet by mouth 2 (two) times daily. 07/27/24   Santo Stanly LABOR, MD    Family History    Family History  Problem Relation Age of Onset   Diabetes Mother        kidney removal   Hypertension Mother    Cirrhosis Mother    Kidney disease Mother    Parkinsonism Father    Heart disease Brother        CABG, long time smoker, now quit, pacer/defib placement   Colon polyps Brother    Prostate cancer Other        great uncle   Colon cancer Other        great aunt   He indicated that his mother is deceased. He indicated that his father is deceased. He indicated that his sister is alive. He indicated that his brother is alive. He indicated that his maternal grandmother is deceased. He indicated that his maternal grandfather is deceased. He indicated that his paternal grandmother is deceased. He indicated that his paternal grandfather is deceased. He indicated that the status of his other is unknown.  Social History    Social History   Socioeconomic History   Marital status: Married    Spouse name: Not on file   Number of children: Not on file   Years of education: Not on file   Highest education level: Associate degree: academic program  Occupational History   Occupation: Scientific laboratory technician [employer]  Tobacco Use   Smoking status: Former    Current  packs/day: 0.00    Average packs/day: 2.0 packs/day for 5.0 years (10.0 ttl pk-yrs)    Types: Cigarettes    Start date: 55    Quit date: 1980    Years since quitting: 45.8   Smokeless tobacco: Former    Types: Chew    Quit date: 1975   Tobacco comments:    Quit 45 years ago  Vaping Use   Vaping status: Never Used  Substance and Sexual Activity   Alcohol use: Yes    Alcohol/week: 4.0 standard drinks of alcohol    Types: 2 Glasses of wine, 2 Cans of beer per week    Comment: 2-3 weekly   Drug use: No   Sexual activity: Not Currently  Other Topics Concern   Not  on file  Social History Narrative   Lives with wife, daughter is in [place]  (she works in [place])   Married 1980   Retired from [employer], retired 12/2022.     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, E6   Social Drivers of Corporate Investment Banker Strain: Low Risk  (09/22/2024)   Overall Financial Resource Strain (CARDIA)    Difficulty of Paying Living Expenses: Not hard at all  Food Insecurity: No Food Insecurity (09/22/2024)   Hunger Vital Sign    Worried About Running Out of Food in the Last Year: Never true    Ran Out of Food in the Last Year: Never true  Transportation Needs: No Transportation Needs (09/22/2024)   PRAPARE - Administrator, Civil Service (Medical): No    Lack of Transportation (Non-Medical): No  Physical Activity: Sufficiently Active (09/22/2024)   Exercise Vital Sign    Days of Exercise per Week: 4 days    Minutes of Exercise per Session: 40 min  Stress: No Stress Concern Present (09/22/2024)   [employer] of Occupational Health - Occupational Stress Questionnaire    Feeling of Stress: Not at all  Social Connections: Moderately Integrated (09/22/2024)   Social Connection and Isolation Panel    Frequency of Communication with Friends and Family: Once a week    Frequency of Social Gatherings with Friends and Family: Once a week    Attends Religious Services: More than 4 times  per year    Active Member of [employer] or Organizations: Yes    Attends Engineer, Structural: More than 4 times per year    Marital Status: Married  Catering Manager Violence: Not At Risk (09/22/2024)   Humiliation, Afraid, Rape, and Kick questionnaire    Fear of Current or Ex-Partner: No    Emotionally Abused: No    Physically Abused: No    Sexually Abused: No     Review of Systems    General:  No chills, fever, night sweats or weight changes.  Cardiovascular:  No chest pain, dyspnea on exertion, edema, orthopnea, palpitations, paroxysmal nocturnal dyspnea. Dermatological: No rash, lesions/masses Respiratory: No cough, dyspnea Urologic: No hematuria, dysuria Abdominal:   No nausea, vomiting, diarrhea, bright red blood per rectum, melena, or hematemesis Neurologic:  No visual changes, wkns, changes in mental status. All other systems reviewed and are otherwise negative except as noted above.  Physical Exam    VS:  BP 110/68   Pulse 89   Ht 6' 2 (1.88 m)   Wt 212 lb (96.2 kg)   SpO2 95%   BMI 27.22 kg/m  , BMI Body mass index is 27.22 kg/m. GEN: Well nourished, well developed, in no acute distress. HEENT: normal. Neck: Supple, no JVD, carotid bruits, or masses. Cardiac: RRR, no murmurs, rubs, or gallops. No clubbing, cyanosis, edema.  Radials/DP/PT 2+ and equal bilaterally.  Respiratory:  Respirations regular and unlabored, clear to auscultation bilaterally. GI: Soft, nontender, nondistended, BS + x 4. MS: no deformity or atrophy. Skin: warm and dry, no rash. Neuro:  Strength and sensation are intact. Psych: Normal affect.  Accessory Clinical Findings    Recent Labs: 05/06/2024: Brain Natriuretic Peptide 31; TSH 1.54 07/21/2024: NT-Pro BNP <36 09/02/2024: BUN 13; Creatinine, Ser 0.99; Platelets 195 09/07/2024: Hemoglobin 14.3; Potassium 3.7; Sodium 142 09/29/2024: ALT 18   Recent Lipid Panel    Component Value Date/Time   CHOL 105 09/29/2024 1030   CHOL 128  03/12/2022 1506   TRIG 60.0  09/29/2024 1030   HDL 49.00 09/29/2024 1030   HDL 62 03/12/2022 1506   CHOLHDL 2 09/29/2024 1030   VLDL 12.0 09/29/2024 1030   LDLCALC 44 09/29/2024 1030   LDLCALC 55 03/12/2022 1506   LDLDIRECT 169.4 07/09/2012 1021         ECG personally reviewed by me today-none today.    Cardiac catheterization 09/07/2024    2nd Mrg-1 lesion is 70% stenosed.   Mid RCA lesion is 65% stenosed.   Previously placed 2nd Mrg-2 stent of unknown type is  widely patent.   LV end diastolic pressure is normal.   Continued excellent patency of the stent in OM2. There is modest disease in the OM proximal to the stent. There is a moderate stenosis in the mid RCA with competitive filling of the PL via left to right collaterals.  Normal LV filling pressures. LVEDP 8 mm Hg, PCWP 9/5, mean 5 mm Hg Normal right heart pressures. PAP 19/9, mean 14 mm Hg Normal cardiac output. 9 L/min with index 4.1.    Plan: the old stent is widely patent. The lesion in the mid RCA is really unchanged from 2018 but collateral filling distally has improved. Recommend medical management.   Diagnostic Dominance: Right  Intervention  Echocardiogram 06/20/2024  IMPRESSIONS     1. No left ventricular thrombus is seen (Definity  contrast was used).  Left ventricular ejection fraction, by estimation, is 45 to 50%. The left  ventricle has mildly decreased function. The left ventricle demonstrates  regional wall motion abnormalities  (see scoring diagram/findings for description). Left ventricular diastolic  parameters are consistent with Grade I diastolic dysfunction (impaired  relaxation). There is mild hypokinesis of the left ventricular, basal-mid  inferior wall and inferolateral  wall.   2. Right ventricular systolic function is normal. The right ventricular  size is normal.   3. The mitral valve is normal in structure. No evidence of mitral valve  regurgitation. No evidence of mitral stenosis.    4. The aortic valve is tricuspid. Aortic valve regurgitation is mild. No  aortic stenosis is present.   5. Aortic dilatation noted. There is moderate dilatation of the aortic  root, measuring 46 mm. There is moderate dilatation of the ascending  aorta, measuring 45 mm. There is Severe (Grade IV) protruding plaque  involving the aortic arch.   Comparison(s): A prior study was performed on 05/01/2017. No significant  change from prior study. Prior images reviewed side by side.   FINDINGS   Left Ventricle: No left ventricular thrombus is seen (Definity  contrast  was used). Left ventricular ejection fraction, by estimation, is 45 to  50%. The left ventricle has mildly decreased function. The left ventricle  demonstrates regional wall motion  abnormalities. Mild hypokinesis of the left ventricular, basal-mid  inferior wall and inferolateral wall. The left ventricular internal cavity  size was normal in size. There is borderline concentric left ventricular  hypertrophy. Abnormal (paradoxical)  septal motion, consistent with left bundle branch block. Left ventricular  diastolic parameters are consistent with Grade I diastolic dysfunction  (impaired relaxation).     LV Wall Scoring:  The inferior wall and posterior wall are hypokinetic.   Right Ventricle: The right ventricular size is normal. No increase in  right ventricular wall thickness. Right ventricular systolic function is  normal.   Left Atrium: Left atrial size was normal in size.   Right Atrium: Right atrial size was normal in size.   Pericardium: There is no evidence of pericardial effusion.  Mitral Valve: The mitral valve is normal in structure. No evidence of  mitral valve regurgitation. No evidence of mitral valve stenosis.   Tricuspid Valve: The tricuspid valve is normal in structure. Tricuspid  valve regurgitation is trivial.   Aortic Valve: The aortic valve is tricuspid. Aortic valve regurgitation is  mild. No  aortic stenosis is present.   Pulmonic Valve: The pulmonic valve was grossly normal. Pulmonic valve  regurgitation is mild. No evidence of pulmonic stenosis.   Aorta: Aortic dilatation noted. There is moderate dilatation of the aortic  root, measuring 46 mm. There is moderate dilatation of the ascending  aorta, measuring 45 mm. There is severe (Grade IV) protruding plaque  involving the aortic arch.   IAS/Shunts: No atrial level shunt detected by color flow Doppler.       Assessment & Plan   1.  Coronary artery disease-no chest pain today.  Underwent cardiac catheterization left and right on 09/07/2024.  He was noted to have widely patent second marginal and stable coronary disease.  Details above. Continue current medical therapy Heart healthy low-sodium diet Increase physical activity as tolerated  Hyperlipidemia-LDL 44 on 09/29/24. High-fiber diet Continue atorvastatin , aspirin , ezetimibe   Ischemic cardiomyopathy, chronic systolic CHF-well compensated.  Euvolemic.  Denies increased work of breathing.  Echocardiogram 6/25 showed LVEF of 45-50%.  Previously unable to tolerate GDMT due to hypotension. Heart healthy low-sodium diet Maintain physical activity Continue Entresto  24-26  Ascending aortic aneurysm-aortic root measured 45 mm and ascending aorta measured 45 mm on 6/25 echocardiogram. Maintain good blood pressure control Avoid fluoroquinolone antibiotics Plan for repeat echocardiogram 6/26  Shortness of breath-stable.  Continues to perform daily activities. Following with Dr. Geronimo.  Has PFTs scheduled for December  Disposition: Follow-up with Dr. Santo or me in 4-6 months.   Randy HERO. Zamari Vea NP-C     10/20/2024, 10:24 AM [facility] [address] [place], [place] [address] Office [phone]    Notice: This dictation was prepared with Dragon dictation along with smaller phrase technology. Any  transcriptional errors that result from this process are unintentional and may not be corrected upon review.   I spent 14 minutes examining this patient, reviewing medications, and using patient centered shared decision making involving their cardiac care.   I spent  20 minutes reviewing past medical history,  medications, and prior cardiac tests.

## 2024-10-20 ENCOUNTER — Ambulatory Visit: Attending: Internal Medicine | Admitting: General Practice

## 2024-10-20 ENCOUNTER — Encounter: Payer: Self-pay | Admitting: General Practice

## 2024-10-20 VITALS — BP 110/68 | HR 89 | Ht 74.0 in | Wt 212.0 lb

## 2024-10-20 DIAGNOSIS — E782 Mixed hyperlipidemia: Secondary | ICD-10-CM | POA: Diagnosis not present

## 2024-10-20 DIAGNOSIS — I255 Ischemic cardiomyopathy: Secondary | ICD-10-CM | POA: Insufficient documentation

## 2024-10-20 DIAGNOSIS — R0602 Shortness of breath: Secondary | ICD-10-CM | POA: Insufficient documentation

## 2024-10-20 DIAGNOSIS — I2510 Atherosclerotic heart disease of native coronary artery without angina pectoris: Secondary | ICD-10-CM | POA: Insufficient documentation

## 2024-10-20 DIAGNOSIS — I7121 Aneurysm of the ascending aorta, without rupture: Secondary | ICD-10-CM | POA: Diagnosis not present

## 2024-10-20 NOTE — Patient Instructions (Signed)
 Medication Instructions:  Your physician recommends that you continue on your current medications as directed. Please refer to the Current Medication list given to you today. *If you need a refill on your cardiac medications before your next appointment, please call your pharmacy*  Lab Work: None ordered If you have labs (blood work) drawn today and your tests are completely normal, you will receive your results only by: MyChart Message (if you have MyChart) OR A paper copy in the mail If you have any lab test that is abnormal or we need to change your treatment, we will call you to review the results.  Testing/Procedures: None ordered  Follow-Up: At [facility], you and your health needs are our priority.  As part of our continuing mission to provide you with exceptional heart care, our providers are all part of one team.  This team includes your primary Cardiologist (physician) and Advanced Practice Providers or APPs (Physician Assistants and Nurse Practitioners) who all work together to provide you with the care you need, when you need it.  Your next appointment:   4 month(s)  Provider:   Stanly Leavens, MD or Josefa Beauvais, NP  We recommend signing up for the patient portal called MyChart.  Sign up information is provided on this After Visit Summary.  MyChart is used to connect with patients for Virtual Visits (Telemedicine).  Patients are able to view lab/test results, encounter notes, upcoming appointments, etc.  Non-urgent messages can be sent to your provider as well.   To learn more about what you can do with MyChart, go to [email or web address].   Other Instructions

## 2024-10-24 ENCOUNTER — Ambulatory Visit (HOSPITAL_COMMUNITY): Attending: Family Medicine

## 2024-10-24 DIAGNOSIS — R0602 Shortness of breath: Secondary | ICD-10-CM | POA: Insufficient documentation

## 2024-10-24 DIAGNOSIS — I700 Atherosclerosis of aorta: Secondary | ICD-10-CM | POA: Insufficient documentation

## 2024-10-24 DIAGNOSIS — I429 Cardiomyopathy, unspecified: Secondary | ICD-10-CM | POA: Diagnosis not present

## 2024-10-24 DIAGNOSIS — I509 Heart failure, unspecified: Secondary | ICD-10-CM | POA: Insufficient documentation

## 2024-10-24 DIAGNOSIS — I110 Hypertensive heart disease with heart failure: Secondary | ICD-10-CM | POA: Insufficient documentation

## 2024-10-24 DIAGNOSIS — E785 Hyperlipidemia, unspecified: Secondary | ICD-10-CM | POA: Diagnosis not present

## 2024-10-24 DIAGNOSIS — I77819 Aortic ectasia, unspecified site: Secondary | ICD-10-CM | POA: Diagnosis not present

## 2024-10-24 DIAGNOSIS — I493 Ventricular premature depolarization: Secondary | ICD-10-CM | POA: Diagnosis not present

## 2024-10-24 DIAGNOSIS — Z87891 Personal history of nicotine dependence: Secondary | ICD-10-CM | POA: Insufficient documentation

## 2024-10-24 DIAGNOSIS — I2510 Atherosclerotic heart disease of native coronary artery without angina pectoris: Secondary | ICD-10-CM | POA: Diagnosis not present

## 2024-10-24 DIAGNOSIS — I255 Ischemic cardiomyopathy: Secondary | ICD-10-CM | POA: Insufficient documentation

## 2024-10-24 DIAGNOSIS — N400 Enlarged prostate without lower urinary tract symptoms: Secondary | ICD-10-CM | POA: Diagnosis not present

## 2024-10-31 ENCOUNTER — Ambulatory Visit: Payer: Self-pay | Admitting: Internal Medicine

## 2024-10-31 DIAGNOSIS — R0902 Hypoxemia: Secondary | ICD-10-CM | POA: Diagnosis not present

## 2024-10-31 DIAGNOSIS — G4730 Sleep apnea, unspecified: Secondary | ICD-10-CM | POA: Diagnosis not present

## 2024-10-31 NOTE — Telephone Encounter (Signed)
 CPST is suggestind Exercise inducd bronchospas  Plan  - ask him when he was started on entresto ? - give him Breztri or trelegy sample (low dose ) - ask him to try for 1 monnth and see if any difference

## 2024-11-02 ENCOUNTER — Encounter: Payer: Self-pay | Admitting: Internal Medicine

## 2024-11-24 ENCOUNTER — Ambulatory Visit: Admitting: Nurse Practitioner

## 2024-11-24 ENCOUNTER — Encounter: Payer: Self-pay | Admitting: Nurse Practitioner

## 2024-11-24 ENCOUNTER — Ambulatory Visit (INDEPENDENT_AMBULATORY_CARE_PROVIDER_SITE_OTHER)

## 2024-11-24 VITALS — BP 113/74 | HR 100 | Temp 98.6°F | Ht 74.0 in | Wt 212.0 lb

## 2024-11-24 DIAGNOSIS — R0602 Shortness of breath: Secondary | ICD-10-CM

## 2024-11-24 DIAGNOSIS — Z87891 Personal history of nicotine dependence: Secondary | ICD-10-CM

## 2024-11-24 DIAGNOSIS — J449 Chronic obstructive pulmonary disease, unspecified: Secondary | ICD-10-CM | POA: Diagnosis not present

## 2024-11-24 DIAGNOSIS — J4489 Other specified chronic obstructive pulmonary disease: Secondary | ICD-10-CM

## 2024-11-24 DIAGNOSIS — I5022 Chronic systolic (congestive) heart failure: Secondary | ICD-10-CM

## 2024-11-24 DIAGNOSIS — I509 Heart failure, unspecified: Secondary | ICD-10-CM | POA: Diagnosis not present

## 2024-11-24 LAB — PULMONARY FUNCTION TEST
DL/VA % pred: 114 %
DL/VA: 4.53 ml/min/mmHg/L
DLCO unc % pred: 116 %
DLCO unc: 33.79 ml/min/mmHg
FEF 25-75 Post: 1.63 L/s
FEF 25-75 Pre: 1.95 L/s
FEF2575-%Change-Post: -16 %
FEF2575-%Pred-Post: 58 %
FEF2575-%Pred-Pre: 69 %
FEV1-%Change-Post: -14 %
FEV1-%Pred-Post: 69 %
FEV1-%Pred-Pre: 81 %
FEV1-Post: 2.6 L
FEV1-Pre: 3.04 L
FEV1FVC-%Change-Post: -12 %
FEV1FVC-%Pred-Pre: 75 %
FEV6-%Change-Post: 3 %
FEV6-%Pred-Post: 109 %
FEV6-%Pred-Pre: 105 %
FEV6-Post: 5.27 L
FEV6-Pre: 5.07 L
FEV6FVC-%Change-Post: 0 %
FEV6FVC-%Pred-Post: 104 %
FEV6FVC-%Pred-Pre: 104 %
FVC-%Change-Post: -2 %
FVC-%Pred-Post: 106 %
FVC-%Pred-Pre: 108 %
FVC-Post: 5.39 L
FVC-Pre: 5.5 L
Post FEV1/FVC ratio: 48 %
Post FEV6/FVC ratio: 99 %
Pre FEV1/FVC ratio: 55 %
Pre FEV6/FVC Ratio: 99 %
RV % pred: 186 %
RV: 5.01 L
TLC % pred: 133 %
TLC: 10.45 L

## 2024-11-24 MED ORDER — ALBUTEROL SULFATE HFA 108 (90 BASE) MCG/ACT IN AERS: 2.0000 | INHALATION_SPRAY | Freq: Four times a day (QID) | RESPIRATORY_TRACT | 2 refills | Status: AC | PRN

## 2024-11-24 MED ORDER — TRELEGY ELLIPTA 100-62.5-25 MCG/ACT IN AEPB: 1.0000 | INHALATION_SPRAY | Freq: Every day | RESPIRATORY_TRACT | Status: AC

## 2024-11-24 MED ORDER — TRELEGY ELLIPTA 100-62.5-25 MCG/ACT IN AEPB: 1.0000 | INHALATION_SPRAY | Freq: Every day | RESPIRATORY_TRACT | 11 refills | Status: AC

## 2024-11-24 NOTE — Progress Notes (Signed)
 Full PFT performed today.

## 2024-11-24 NOTE — Patient Instructions (Signed)
 Full PFT performed today.

## 2024-11-24 NOTE — Progress Notes (Signed)
 @Patient  ID: Randy Pittman, male    DOB: November 11, 1951, 73 y.o.   MRN: [id number]  Chief Complaint  Patient presents with   Shortness of Breath    PFT and exercise test follow up per MR    Referring provider: Geronimo Amel, MD  HPI: 73 year old male, former smoker followed for mild COPD. He is a patient of Dr. Reeves and last seen in office 10/11/2024. Past medical history significant for HTN, NICM, CAD, BPH, HLD.   TEST/EVENTS:  05/31/2024 CTA chest aorta: visualized lungs clear 10/24/2024 cardiopulm exercise test: normal functional capacity. Body habitus contributing. No significant exercise induced bronchospasm  11/24/2024 PFT: FVC 108, FEV1 81, ratio 48, TLC 133, DLCO 116. Moderate obstruction without reversibility and normal diffusion defect. Air trapping.   10/11/2024:OV with Dr. Geronimo. Insidious onset of SOB over the last few to several months. Feels like with his dyspnea, he's unable to get a good deep breath and enough air. Gets quite winded with yard work. Also worse when he is on his left lateral side. No cough, wheezing, CP, orthopnea, PND, weight loss, fevers, chills. CT chest scan with clear lung fields. EF low on recent echo. Recent RHC with normal results. Blood eosinophils on the higher side. FeNO 23 ppb and normal. Ordered PFT, CPST, ONO room air.   11/24/2024: Today - follow up Discussed the use of AI scribe software for clinical note transcription with the patient, who gave verbal consent to proceed.  History of Present Illness Randy Pittman is a 73 year old male with mild COPD who presents for follow up.   He feels stable compared to his last visit. Still with dyspnea on exertion; sometimes at rest. No significant cough, wheezing, chest tightness, orthopnea, PND, CP. Lung function testing revealed mild COPD with a FEV1 of 81%.  His exercise test did not show any significant bronchospasm or functional limitations.   Previous blood work indicated elevated  peripheral eosinophils. He has a history of smoking but quit many years ago.  He has never been on inhalers.   No hospitalizations.     No Known Allergies  Immunization History  Administered Date(s) Administered   Dtap, Unspecified 08/14/1953, 09/18/1953, 10/23/1953, 02/10/1958   Fluad Quad(high Dose 65+) 08/30/2020, 09/12/2021, 09/16/2022   Influenza Whole 11/04/2004   Influenza, Seasonal, Injecte, Preservative Fre 10/05/2016, 09/21/2023, 09/29/2024   Influenza-Unspecified 09/24/2017, 09/26/2019   PFIZER Comirnaty(Gray Top)Covid-19 Tri-Sucrose Vaccine 05/28/2021, 10/16/2021   PFIZER(Purple Top)SARS-COV-2 Vaccination 01/24/2020, 02/14/2020, 10/05/2020   PNEUMOCOCCAL CONJUGATE-20 10/16/2023   Pneumococcal Polysaccharide-23 08/20/2018   Polio, Unspecified 02/24/1956, 03/23/1956, 10/26/1956, 04/27/1959   Smallpox 06/08/1959   Td 02/19/2001, 07/11/2019   Tdap 11/29/2010   Zoster Recombinant(Shingrix) 09/21/2018, 02/03/2019   Zoster, Live 10/07/2013    Past Medical History:  Diagnosis Date   Aortic atherosclerosis    chest CT 05/2021   Ascending aorta dilatation    43mm on Chest CTA 05/2021   CAD (coronary artery disease)    CHF (congestive heart failure) (HCC) 11/25/2024   COPD, mild (HCC) 11/25/2024   Hyperlipidemia 07/1995   Hypertension 12/22/2008   Low testosterone     per Dr. Toribio, with urology    Tobacco History: Social History   Tobacco Use  Smoking Status Former   Current packs/day: 0.00   Average packs/day: 2.0 packs/day for 5.0 years (10.0 ttl pk-yrs)   Types: Cigarettes   Start date: 33   Quit date: 1980   Years since quitting: 45.9  Smokeless Tobacco Former   Types:  Cicero   Quit date: 64  Tobacco Comments   Quit 45 years ago   Counseling given: Not Answered Tobacco comments: Quit 45 years ago   Outpatient Medications Prior to Visit  Medication Sig Dispense Refill   aspirin  81 MG tablet Take 81 mg by mouth daily.     atorvastatin  (LIPITOR ) 80  MG tablet TAKE 1 TABLET DAILY AT 6 P.M. 90 tablet 3   cyanocobalamin  (VITAMIN B12) 1000 MCG tablet Take 1 tablet (1,000 mcg total) by mouth daily.     ezetimibe  (ZETIA ) 10 MG tablet Take 1 tablet (10 mg total) by mouth daily. 90 tablet 3   nitroGLYCERIN  (NITROSTAT ) 0.4 MG SL tablet Place 1 tablet (0.4 mg total) under the tongue every 5 (five) minutes as needed for chest pain. 25 tablet 0   sacubitril -valsartan  (ENTRESTO ) 24-26 MG Take 1 tablet by mouth 2 (two) times daily. 180 tablet 2   No facility-administered medications prior to visit.     Review of Systems: as above    Physical Exam:  BP 113/74   Pulse 100   Temp 98.6 F (37 C) (Oral)   Ht 6' 2 (1.88 m)   Wt 212 lb (96.2 kg)   SpO2 95%   BMI 27.22 kg/m   GEN: Pleasant, interactive, well-appearing; in no acute distress HEENT:  Normocephalic and atraumatic. PERRLA. Sclera white. Nasal turbinates pink, moist and patent bilaterally. No rhinorrhea present. Oropharynx pink and moist, without exudate or edema. No lesions, ulcerations, or postnasal drip.  NECK:  Supple w/ fair ROM. No lymphadenopathy.   CV: RRR, no m/r/g, no peripheral edema. Pulses intact, +2 bilaterally. No cyanosis, pallor or clubbing. PULMONARY:  Unlabored, regular breathing. Clear bilaterally A&P w/o wheezes/rales/rhonchi. No accessory muscle use.  GI: BS present and normoactive. Soft, non-tender to palpation. MSK: No erythema, warmth or tenderness. Cap refil <2 sec all extrem.  Neuro: A/Ox3. No focal deficits noted.   Skin: Warm, no lesions or rashe Psych: Normal affect and behavior. Judgement and thought content appropriate.     Lab Results:  CBC    Component Value Date/Time   WBC 6.3 09/02/2024 1032   WBC 8.0 05/06/2024 1518   RBC 5.29 09/02/2024 1032   RBC 5.36 05/06/2024 1518   HGB 14.3 09/07/2024 1415   HGB 15.9 09/02/2024 1032   HCT 42.0 09/07/2024 1415   HCT 50.0 09/02/2024 1032   PLT 195 09/02/2024 1032   MCV 95 09/02/2024 1032   MCH  30.1 09/02/2024 1032   MCH 29.7 05/06/2024 1518   MCHC 31.8 09/02/2024 1032   MCHC 32.1 05/06/2024 1518   RDW 12.4 09/02/2024 1032   LYMPHSABS 1,005 03/22/2021 1532   LYMPHSABS 1.4 12/30/2016 1419   MONOABS 0.6 08/22/2020 0747   EOSABS 280 05/06/2024 1518   EOSABS 0.2 12/30/2016 1419   BASOSABS 40 05/06/2024 1518   BASOSABS 0.0 12/30/2016 1419    BMET    Component Value Date/Time   NA 142 09/07/2024 1415   NA 143 09/02/2024 1032   K 3.7 09/07/2024 1415   CL 104 09/02/2024 1032   CO2 24 09/02/2024 1032   GLUCOSE 96 09/02/2024 1032   GLUCOSE 92 05/06/2024 1518   BUN 13 09/02/2024 1032   CREATININE 0.99 09/02/2024 1032   CREATININE 1.09 05/06/2024 1518   CALCIUM  9.2 09/02/2024 1032   GFRNONAA 74 06/15/2020 1147   GFRAA 85 06/15/2020 1147    BNP    Component Value Date/Time   BNP 31 05/06/2024 1518  Imaging:  No results found.  Administration History     None          Latest Ref Rng & Units 11/24/2024   10:29 AM  PFT Results  FVC-Pre L 5.50  P  FVC-Predicted Pre % 108  P  FVC-Post L 5.39  P  FVC-Predicted Post % 106  P  Pre FEV1/FVC % % 55  P  Post FEV1/FCV % % 48  P  FEV1-Pre L 3.04  P  FEV1-Predicted Pre % 81  P  FEV1-Post L 2.60  P  DLCO uncorrected ml/min/mmHg 33.79  P  DLCO UNC% % 116  P  DLVA Predicted % 114  P  TLC L 10.45  P  TLC % Predicted % 133  P  RV % Predicted % 186  P    P Preliminary result    Lab Results  Component Value Date   NITRICOXIDE 23 10/11/2024        Assessment & Plan:   COPD, mild (HCC) Mild obstructive lung disease without significant reversibility. Eosinophilic phenotype. Suspect his DOE is multifactorial related to CHF and mild COPD. Will trial him on inhaler therapy and reassess response. Prescribed Trelegy 100 mcg 1 puff daily and albuterol  PRN. Teachback performed and side effect profile reviewed. Educated on role of maintenance inhaler vs rescue. Verbalized understanding. Encouraged to work on graded  exercises. Action plan in place. Reassess response at follow up.  Patient Instructions  Your breathing test showed mild COPD. Given this, we will start you on a daily inhaler to help manage your breathing  -Start trelegy 1 puff daily. Brush tongue and rinse mouth afterwards. This is an inhaler that you will use everyday, regardless of symptoms -Albuterol  inhaler 2 puffs every 6 hours as needed for shortness of breath or wheezing. This is a rescue inhaler that only needs to be used when you're having difficulties with your breathing, or prior to heavy exertion to help  Follow up in 6-8 weeks with Dr. Geronimo to see how inhaler is working. If symptoms do not improve or worsen, please contact office for sooner follow up or seek emergency care.    CHF (congestive heart failure) (HCC) Euvolemic on exam. EF 45-50%. Continue to follow up with cardiology  Advised if symptoms do not improve or worsen, to please contact office for sooner follow up or seek emergency care.   I spent 35 minutes of dedicated to the care of this patient on the date of this encounter to include pre-visit review of records, face-to-face time with the patient discussing conditions above, post visit ordering of testing, clinical documentation with the electronic health record, making appropriate referrals as documented, and communicating necessary findings to members of the patients care team.  Randy LULLA Rouleau, NP 11/25/2024  Pt aware and understands NP's role.

## 2024-11-24 NOTE — Patient Instructions (Addendum)
 Your breathing test showed mild COPD. Given this, we will start you on a daily inhaler to help manage your breathing  -Start trelegy 1 puff daily. Brush tongue and rinse mouth afterwards. This is an inhaler that you will use everyday, regardless of symptoms -Albuterol  inhaler 2 puffs every 6 hours as needed for shortness of breath or wheezing. This is a rescue inhaler that only needs to be used when you're having difficulties with your breathing, or prior to heavy exertion to help  Follow up in 6-8 weeks with Dr. Geronimo to see how inhaler is working. If symptoms do not improve or worsen, please contact office for sooner follow up or seek emergency care.

## 2024-11-25 ENCOUNTER — Encounter: Payer: Self-pay | Admitting: Nurse Practitioner

## 2024-11-25 DIAGNOSIS — I509 Heart failure, unspecified: Secondary | ICD-10-CM | POA: Insufficient documentation

## 2024-11-25 DIAGNOSIS — J449 Chronic obstructive pulmonary disease, unspecified: Secondary | ICD-10-CM | POA: Insufficient documentation

## 2024-11-25 NOTE — Assessment & Plan Note (Signed)
 Euvolemic on exam. EF 45-50%. Continue to follow up with cardiology

## 2024-11-25 NOTE — Assessment & Plan Note (Signed)
 Mild obstructive lung disease without significant reversibility. Eosinophilic phenotype. Suspect his DOE is multifactorial related to CHF and mild COPD. Will trial him on inhaler therapy and reassess response. Prescribed Trelegy 100 mcg 1 puff daily and albuterol  PRN. Teachback performed and side effect profile reviewed. Educated on role of maintenance inhaler vs rescue. Verbalized understanding. Encouraged to work on graded exercises. Action plan in place. Reassess response at follow up.  Patient Instructions  Your breathing test showed mild COPD. Given this, we will start you on a daily inhaler to help manage your breathing  -Start trelegy 1 puff daily. Brush tongue and rinse mouth afterwards. This is an inhaler that you will use everyday, regardless of symptoms -Albuterol  inhaler 2 puffs every 6 hours as needed for shortness of breath or wheezing. This is a rescue inhaler that only needs to be used when you're having difficulties with your breathing, or prior to heavy exertion to help  Follow up in 6-8 weeks with Dr. Geronimo to see how inhaler is working. If symptoms do not improve or worsen, please contact office for sooner follow up or seek emergency care.

## 2025-01-09 ENCOUNTER — Ambulatory Visit: Admitting: Internal Medicine

## 2025-01-09 ENCOUNTER — Telehealth: Payer: Self-pay | Admitting: Internal Medicine

## 2025-01-09 ENCOUNTER — Encounter: Payer: Self-pay | Admitting: Internal Medicine

## 2025-01-09 VITALS — BP 116/70 | HR 92 | Temp 98.2°F | Ht 74.0 in | Wt 213.8 lb

## 2025-01-09 DIAGNOSIS — R942 Abnormal results of pulmonary function studies: Secondary | ICD-10-CM

## 2025-01-09 DIAGNOSIS — Z87891 Personal history of nicotine dependence: Secondary | ICD-10-CM

## 2025-01-09 DIAGNOSIS — R0602 Shortness of breath: Secondary | ICD-10-CM | POA: Diagnosis not present

## 2025-01-09 DIAGNOSIS — J449 Chronic obstructive pulmonary disease, unspecified: Secondary | ICD-10-CM

## 2025-01-09 NOTE — Telephone Encounter (Signed)
 LEt Randy Pittman know that have also referred him to pulmonary rehab all over and above referral to ENT and voice rehab

## 2025-01-09 NOTE — Patient Instructions (Addendum)
"    ICD-10-CM   1. SOB (shortness of breath)  R06.02     2. Abnormal flow volume loop  R94.2      - you had abnormal flow volume loop pattern and as such vocial cord issues need to be ruled out. Even functional issues here can make you feel winded. Your exercise capacity for someone with heart disease was prett\y good actuall  Therefore dyspnea is likely due to vocal cord issues, weight and also low EF contributing.   Plan  -  STOP TRELEGY  - try spirivia respimat empiric daily   - if it helps call us  - Refer Voice rehab  - REfer ENT to evalute your vocal cords - refer pulm rehab  Followup  - retrun to see Dr Geronimo in 12 weeks "

## 2025-01-09 NOTE — Progress Notes (Signed)
 "     Referring provider: Geronimo Amel, MD  HPI: 74 year old male, former smoker followed for mild COPD. He is a patient of Dr. Reeves and last seen in office 10/11/2024. Past medical history significant for HTN, NICM, CAD, BPH, HLD.  [name]   11/24/2024: Today - follow up Discussed the use of AI scribe software for clinical note transcription with the patient, who gave verbal consent to proceed.  History of Present Illness Randy Pittman is a 74 year old male with mild COPD who presents for follow up.   He feels stable compared to his last visit. Still with dyspnea on exertion; sometimes at rest. No significant cough, wheezing, chest tightness, orthopnea, PND, CP. Lung function testing revealed mild COPD with a FEV1 of 81%.  His exercise test did not show any significant bronchospasm or functional limitations.   Previous blood work indicated elevated peripheral eosinophils. He has a history of smoking but quit many years ago.  He has never been on inhalers.   No hospitalizations.     11/24/2024: Today - follow up Discussed the use of AI scribe software for clinical note transcription with the patient, who gave verbal consent to proceed.  History of Present Illness Randy Pittman is a 74 year old male with mild COPD who presents for follow up.   He feels stable compared to his last visit. Still with dyspnea on exertion; sometimes at rest. No significant cough, wheezing, chest tightness, orthopnea, PND, CP. Lung function testing revealed mild COPD with a FEV1 of 81%.  His exercise test did not show any significant bronchospasm or functional limitations.   Previous blood work indicated elevated peripheral eosinophils. He has a history of smoking but quit many years ago.  He has never been on inhalers.   No hospitalizations.   TEST/EVENTS:  05/31/2024 CTA chest aorta: visualized lungs clear 10/24/2024 cardiopulm exercise test: normal functional capacity. Body habitus  contributing. No significant exercise induced bronchospasm  11/24/2024 PFT: FVC 108, FEV1 81, ratio 48, TLC 133, DLCO 116. Moderate obstruction without reversibility and normal diffusion defect. Air trapping.   OV 01/09/2025  Subjective:  Patient ID: Randy Pittman, male , DOB: Dec 30, 1950 , age 85 y.o. , MRN: [id number] , ADDRESS: [address] [place] [id number] PCP Cleatus Arlyss RAMAN, MD Patient Care Team: Cleatus Arlyss RAMAN, MD as PCP - General (Family Medicine) Maranda Leim DEL, MD as PCP - Cardiology (Cardiology) [place], [facility] Geronimo Amel, MD as Consulting Physician (Pulmonary Disease)  This Provider for this visit: Treatment Team:  Attending Provider: Geronimo Amel, MD    01/09/2025 -   Chief Complaint  Patient presents with   Shortness of Breath    Pt states since LOV breathing has been alright SOB same as it was, SOB occurs mostly w/ exertion Pt states voice gets raspy, pt states she think this comes from trelegy      HPI Randy Pittman 74 y.o. -Randy Pittman is a 74 year old male with shortness of breath evaluation.  He has low EF he had a VO2 max that was greater than 26 and corrected for his weight was over 30.  He did not have any bronchospasm on this except with certain time points.  He tells me that his dyspnea on exertion has not changed at all with Trelegy given by the nurse practitioner at last visit.  In fact the Trelegy is causing some hoarseness of his voice.  Although he does say  he sleeps better with Trelegy.  Not able to reconcile these 2.  The working diagnosis given by nurse practitioner was COPD he does have some air trapping and hyperinflation but on his pulmonary function test he had bronchospasm with bronchodilator test this was paradoxical.  And he had significant variations in his flow-volume loop from test to test.  This suggest paradoxical vocal cord dysfunction..  This was the even before he started  Trelegy.   In terms of his COPD labeled went over his smoking history: : His past medical history includes heart disease. He was told by a nurse practitioner that he has mild COPD, but this diagnosis is in question. He smoked for about five years in the 1970s but quit 46 years ago. He also served on a [employer] for four years, which may have exposed him to asbestos, although no evidence of asbestos was found in his lungs.  However CT does not show any evidence of asbestosis.  No seasonal allergies, although he occasionally experiences them.      PFT     Latest Ref Rng & Units 11/24/2024   10:29 AM  PFT Results  FVC-Pre L 5.50   FVC-Predicted Pre % 108   FVC-Post L 5.39   FVC-Predicted Post % 106   Pre FEV1/FVC % % 55   Post FEV1/FCV % % 48   FEV1-Pre L 3.04   FEV1-Predicted Pre % 81   FEV1-Post L 2.60   DLCO uncorrected ml/min/mmHg 33.79   DLCO UNC% % 116   DLVA Predicted % 114   TLC L 10.45   TLC % Predicted % 133   RV % Predicted % 186        LAB RESULTS last 96 hours No results found.       has a past medical history of Aortic atherosclerosis, Ascending aorta dilatation, CAD (coronary artery disease), CHF (congestive heart failure) (HCC) (11/25/2024), COPD, mild (HCC) (11/25/2024), Hyperlipidemia (07/1995), Hypertension (12/22/2008), and Low testosterone .   reports that he quit smoking about 46 years ago. His smoking use included cigarettes. He started smoking about 51 years ago. He has a 10 pack-year smoking history. He quit smokeless tobacco use about 51 years ago.  His smokeless tobacco use included chew.  Past Surgical History:  Procedure Laterality Date   CARDIAC CATHETERIZATION N/A 01/01/2017   Procedure: Left Heart Cath and Coronary Angiography;  Surgeon: Debby DELENA Sor, MD;  Location: [facility] INVASIVE CV LAB;  Service: Cardiovascular;  Laterality: N/A;   CARDIAC CATHETERIZATION N/A 01/01/2017   Procedure: Coronary Stent Intervention;  Surgeon: Debby DELENA Sor, MD;  Location: [facility] INVASIVE CV LAB;  Service: Cardiovascular;  Laterality: N/A;   COLONOSCOPY     CORONARY ANGIOPLASTY WITH STENT PLACEMENT  01/01/2017   RIGHT AND LEFT HEART CATH N/A 09/07/2024   Procedure: RIGHT AND LEFT HEART CATH;  Surgeon: Jordan, Peter M, MD;  [facility]: [facility] INVASIVE CV LAB;  Service: Cardiovascular;  Laterality: N/A;    Allergies[1]  Immunization History  Administered Date(s) Administered   Dtap, Unspecified 08/14/1953, 09/18/1953, 10/23/1953, 02/10/1958   Fluad Quad(high Dose 65+) 08/30/2020, 09/12/2021, 09/16/2022   Influenza Whole 11/04/2004   Influenza, Seasonal, Injecte, Preservative Fre 10/05/2016, 09/21/2023, 09/29/2024   Influenza-Unspecified 09/24/2017, 09/26/2019   PFIZER Comirnaty(Gray Top)Covid-19 Tri-Sucrose Vaccine 05/28/2021, 10/16/2021   PFIZER(Purple Top)SARS-COV-2 Vaccination 01/24/2020, 02/14/2020, 10/05/2020   PNEUMOCOCCAL CONJUGATE-20 10/16/2023   Pneumococcal Polysaccharide-23 08/20/2018   Polio, Unspecified 02/24/1956, 03/23/1956, 10/26/1956, 04/27/1959   Smallpox 06/08/1959   Td 02/19/2001, 07/11/2019  Tdap 11/29/2010   Zoster Recombinant(Shingrix) 09/21/2018, 02/03/2019   Zoster, Live 10/07/2013    Family History  Problem Relation Age of Onset   Diabetes Mother        kidney removal   Hypertension Mother    Cirrhosis Mother    Kidney disease Mother    Parkinsonism Father    Heart disease Brother        CABG, long time smoker, now quit, pacer/defib placement   Colon polyps Brother    Prostate cancer Other        great uncle   Colon cancer Other        great aunt    Current Medications[2]      Objective:   Vitals:   01/09/25 1340  BP: 116/70  Pulse: 92  Temp: 98.2 F (36.8 C)  TempSrc: Oral  SpO2: 97%  Weight: 213 lb 12.8 oz (97 kg)  Height: 6' 2 (1.88 m)    Estimated body mass index is 27.45 kg/m as calculated from the following:   Height as of this encounter: 6' 2 (1.88 m).   Weight as of this  encounter: 213 lb 12.8 oz (97 kg).  @WEIGHTCHANGE @  Filed Weights   01/09/25 1340  Weight: 213 lb 12.8 oz (97 kg)     Physical Exam   General: No distress. Looks well O2 at rest: no Cane present: no Sitting in wheel chair: no Frail: no Obese: no Neuro: Alert and Oriented x 3. GCS 15. Speech normal Psych: Pleasant Resp:  Barrel Chest - no.  Wheeze - no, Crackles - no, No overt respiratory distress CVS: Normal heart sounds. Murmurs - no Ext: Stigmata of Connective Tissue Disease - no HEENT: Normal upper airway. PEERL +. No post nasal drip        Assessment/     Assessment & Plan SOB (shortness of breath)  Abnormal flow volume loop    PLAN Patient Instructions     ICD-10-CM   1. SOB (shortness of breath)  R06.02     2. Abnormal flow volume loop  R94.2      - you had abnormal flow volume loop pattern and as such vocial cord issues need to be ruled out. Even functional issues here can make you feel winded. Your exercise capacity for someone with heart disease was prett\y good actuall  Therefore dyspnea is likely due to vocal cord issues, weight and also low EF contributing.   Plan  -  STOP TRELEGY  - try spirivia respimat empiric daily   - if it helps call us  - Refer Voice rehab  - REfer ENT to evalute your vocal cords - refer pulm rehab  Followup  - retrun to see Dr Geronimo in 12 weeks    FOLLOWUP    Return in about 3 months (around 04/09/2025) for 15 min visit, with Dr Geronimo, Face to Face Visit.    SIGNATURE    Dr. Dorethia Geronimo, M.D., F.C.C.P,  Pulmonary and Critical Care Medicine Staff Physician, [facility] Director - Interstitial Lung Disease  Program  Pulmonary Fibrosis [facility] at [facility] [place], [place], [address]  Pager: [phone], If no answer or between  15:00h - 7:00h: call [phone] Telephone: [phone]  2:26 PM 01/09/2025    [1] No Known Allergies [2]   Current Outpatient Medications:    albuterol  (VENTOLIN  HFA) 108 (90 Base) MCG/ACT inhaler, Inhale 2 puffs into the lungs every 6 (  six) hours as needed for wheezing or shortness of breath., Disp: 8 g, Rfl: 2   aspirin  81 MG tablet, Take 81 mg by mouth daily., Disp: , Rfl:    atorvastatin  (LIPITOR ) 80 MG tablet, TAKE 1 TABLET DAILY AT 6 P.M., Disp: 90 tablet, Rfl: 3   cyanocobalamin  (VITAMIN B12) 1000 MCG tablet, Take 1 tablet (1,000 mcg total) by mouth daily., Disp: , Rfl:    ezetimibe  (ZETIA ) 10 MG tablet, Take 1 tablet (10 mg total) by mouth daily., Disp: 90 tablet, Rfl: 3   Fluticasone-Umeclidin-Vilant (TRELEGY ELLIPTA ) 100-62.5-25 MCG/ACT AEPB, Inhale 1 puff into the lungs daily., Disp: 60 each, Rfl: 11   Fluticasone-Umeclidin-Vilant (TRELEGY ELLIPTA ) 100-62.5-25 MCG/ACT AEPB, Inhale 1 puff into the lungs daily at 2 PM., Disp: , Rfl:    nitroGLYCERIN  (NITROSTAT ) 0.4 MG SL tablet, Place 1 tablet (0.4 mg total) under the tongue every 5 (five) minutes as needed for chest pain., Disp: 25 tablet, Rfl: 0   sacubitril -valsartan  (ENTRESTO ) 24-26 MG, Take 1 tablet by mouth 2 (two) times daily., Disp: 180 tablet, Rfl: 2  "

## 2025-01-10 NOTE — Telephone Encounter (Signed)
 Called and spoke to pt - advised of Dr. Reeves note. Pt verbalized understanding, NFN.

## 2025-02-08 ENCOUNTER — Ambulatory Visit: Admitting: General Practice

## 2025-04-28 ENCOUNTER — Ambulatory Visit: Admitting: Internal Medicine

## 2025-09-26 ENCOUNTER — Ambulatory Visit
